# Patient Record
Sex: Female | Born: 1945
Health system: Southern US, Community
[De-identification: ages and names within clinical notes are randomized; demographics above are authoritative.]

## PROBLEM LIST (undated history)

## (undated) DIAGNOSIS — I420 Dilated cardiomyopathy: Secondary | ICD-10-CM

## (undated) DIAGNOSIS — M1990 Unspecified osteoarthritis, unspecified site: Secondary | ICD-10-CM

## (undated) DIAGNOSIS — G459 Transient cerebral ischemic attack, unspecified: Secondary | ICD-10-CM

## (undated) DIAGNOSIS — I700 Atherosclerosis of aorta: Secondary | ICD-10-CM

## (undated) DIAGNOSIS — K219 Gastro-esophageal reflux disease without esophagitis: Secondary | ICD-10-CM

## (undated) DIAGNOSIS — I10 Essential (primary) hypertension: Secondary | ICD-10-CM

## (undated) DIAGNOSIS — K5732 Diverticulitis of large intestine without perforation or abscess without bleeding: Secondary | ICD-10-CM

## (undated) HISTORY — DX: Unspecified osteoarthritis, unspecified site: M19.90

## (undated) HISTORY — DX: Diverticulitis of large intestine without perforation or abscess without bleeding: K57.32

## (undated) HISTORY — DX: Gastro-esophageal reflux disease without esophagitis: K21.9

## (undated) HISTORY — PX: OTHER SURGICAL HISTORY: SHX169

---

## 2000-11-22 LAB — HM COLONOSCOPY: HM Colonoscopy: NORMAL

## 2002-09-22 ENCOUNTER — Encounter: Payer: Self-pay | Admitting: Internal Medicine

## 2003-02-05 ENCOUNTER — Encounter: Payer: Self-pay | Admitting: Internal Medicine

## 2003-02-05 LAB — CONVERTED CEMR LAB

## 2008-03-29 ENCOUNTER — Ambulatory Visit: Payer: Self-pay | Admitting: Internal Medicine

## 2008-03-29 DIAGNOSIS — D179 Benign lipomatous neoplasm, unspecified: Secondary | ICD-10-CM | POA: Insufficient documentation

## 2008-03-29 DIAGNOSIS — L570 Actinic keratosis: Secondary | ICD-10-CM

## 2008-03-29 DIAGNOSIS — R5383 Other fatigue: Secondary | ICD-10-CM

## 2008-03-29 DIAGNOSIS — R5381 Other malaise: Secondary | ICD-10-CM

## 2008-03-29 DIAGNOSIS — I8390 Asymptomatic varicose veins of unspecified lower extremity: Secondary | ICD-10-CM

## 2008-03-31 LAB — CONVERTED CEMR LAB
ALT: 27 units/L (ref 0–35)
AST: 28 units/L (ref 0–37)
BUN: 14 mg/dL (ref 6–23)
Bilirubin, Direct: 0.1 mg/dL (ref 0.0–0.3)
Calcium: 9.8 mg/dL (ref 8.4–10.5)
Creatinine, Ser: 0.7 mg/dL (ref 0.4–1.2)
Eosinophils Relative: 2.3 % (ref 0.0–5.0)
GFR calc Af Amer: 109 mL/min
Glucose, Bld: 90 mg/dL (ref 70–99)
HCT: 37.6 % (ref 36.0–46.0)
Hemoglobin: 12.8 g/dL (ref 12.0–15.0)
Lymphocytes Relative: 46.5 % — ABNORMAL HIGH (ref 12.0–46.0)
Monocytes Absolute: 0.4 10*3/uL (ref 0.1–1.0)
Monocytes Relative: 8.7 % (ref 3.0–12.0)
Neutro Abs: 1.9 10*3/uL (ref 1.4–7.7)
Phosphorus: 4.1 mg/dL (ref 2.3–4.6)
Platelets: 190 10*3/uL (ref 150–400)
Potassium: 3.9 meq/L (ref 3.5–5.1)
Total Bilirubin: 0.9 mg/dL (ref 0.3–1.2)
Total Protein: 8.1 g/dL (ref 6.0–8.3)
WBC: 4.6 10*3/uL (ref 4.5–10.5)

## 2008-05-29 ENCOUNTER — Encounter: Payer: Self-pay | Admitting: Internal Medicine

## 2008-07-23 ENCOUNTER — Ambulatory Visit: Payer: Self-pay | Admitting: Internal Medicine

## 2008-12-20 ENCOUNTER — Ambulatory Visit: Payer: Self-pay | Admitting: Family Medicine

## 2008-12-21 ENCOUNTER — Encounter: Payer: Self-pay | Admitting: Family Medicine

## 2010-08-10 ENCOUNTER — Encounter: Payer: Self-pay | Admitting: Internal Medicine

## 2010-11-14 NOTE — Miscellaneous (Signed)
Summary: FLU VACCINE  Clinical Lists Changes  Observations: Added new observation of FLU VAX: Historical (08/06/2010 12:25)      Influenza Immunization History:    Influenza # 1:  Historical (08/06/2010)

## 2010-11-22 LAB — HM PAP SMEAR: HM Pap smear: NORMAL

## 2011-09-27 ENCOUNTER — Ambulatory Visit: Payer: Self-pay | Admitting: Internal Medicine

## 2011-11-23 ENCOUNTER — Ambulatory Visit (INDEPENDENT_AMBULATORY_CARE_PROVIDER_SITE_OTHER): Payer: Medicare Other | Admitting: Internal Medicine

## 2011-11-23 ENCOUNTER — Ambulatory Visit (INDEPENDENT_AMBULATORY_CARE_PROVIDER_SITE_OTHER)
Admission: RE | Admit: 2011-11-23 | Discharge: 2011-11-23 | Disposition: A | Discharge status: Home / Self Care | Payer: MEDICARE | Source: Ambulatory Visit | Attending: Internal Medicine | Admitting: Internal Medicine

## 2011-11-23 ENCOUNTER — Encounter: Payer: Self-pay | Admitting: Internal Medicine

## 2011-11-23 VITALS — BP 146/88 | HR 96 | Temp 97.4°F | Ht 65.0 in | Wt 159.0 lb

## 2011-11-23 DIAGNOSIS — I8390 Asymptomatic varicose veins of unspecified lower extremity: Secondary | ICD-10-CM

## 2011-11-23 DIAGNOSIS — M542 Cervicalgia: Secondary | ICD-10-CM

## 2011-11-23 DIAGNOSIS — Z1211 Encounter for screening for malignant neoplasm of colon: Secondary | ICD-10-CM

## 2011-11-23 DIAGNOSIS — R5383 Other fatigue: Secondary | ICD-10-CM

## 2011-11-23 DIAGNOSIS — D709 Neutropenia, unspecified: Secondary | ICD-10-CM

## 2011-11-23 DIAGNOSIS — Z1239 Encounter for other screening for malignant neoplasm of breast: Secondary | ICD-10-CM

## 2011-11-23 DIAGNOSIS — Z13220 Encounter for screening for lipoid disorders: Secondary | ICD-10-CM

## 2011-11-23 DIAGNOSIS — G542 Cervical root disorders, not elsewhere classified: Secondary | ICD-10-CM

## 2011-11-23 NOTE — Patient Instructions (Signed)
I recommend a cervical pillow to provide neck support at night.  Your shoulder tingling appears to be cause by your bra strap which is causing a compressive neuropathy  We will get a mammogram at [facility]  We will refer you to a Gi specialist for your colonoscopy. (I will look for Nancy's same doc)  I will order an x ray of your neck , to be done at [facility] office this afternoon.   A baby aspirin two or three times weekly is protective on your heart  Referral to Vascular Surgeon under way for varicose veins.   Return for fasting labs at your convenience.
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
## 2011-11-25 ENCOUNTER — Encounter: Payer: Self-pay | Admitting: Internal Medicine

## 2011-11-25 DIAGNOSIS — M1990 Unspecified osteoarthritis, unspecified site: Secondary | ICD-10-CM | POA: Insufficient documentation

## 2011-11-25 DIAGNOSIS — G542 Cervical root disorders, not elsewhere classified: Secondary | ICD-10-CM | POA: Insufficient documentation

## 2011-11-25 DIAGNOSIS — Z1239 Encounter for other screening for malignant neoplasm of breast: Secondary | ICD-10-CM | POA: Insufficient documentation

## 2011-11-25 DIAGNOSIS — K219 Gastro-esophageal reflux disease without esophagitis: Secondary | ICD-10-CM | POA: Insufficient documentation

## 2011-11-25 DIAGNOSIS — D709 Neutropenia, unspecified: Secondary | ICD-10-CM | POA: Insufficient documentation

## 2011-11-25 DIAGNOSIS — M542 Cervicalgia: Secondary | ICD-10-CM | POA: Insufficient documentation

## 2011-11-25 DIAGNOSIS — K5732 Diverticulitis of large intestine without perforation or abscess without bleeding: Secondary | ICD-10-CM | POA: Insufficient documentation

## 2011-11-25 DIAGNOSIS — Z1231 Encounter for screening mammogram for malignant neoplasm of breast: Secondary | ICD-10-CM | POA: Insufficient documentation

## 2011-11-25 NOTE — Assessment & Plan Note (Signed)
She has no history of frequent infections. I have recommended an delivery to her prior CBCs and look at the cell lines. There is no immediate need for referral to hematology.

## 2011-11-25 NOTE — Assessment & Plan Note (Signed)
Her posterior lateral neck pain is likely due to arthritis involving the C3-C4 vertebrae in the neck. Will obtain plain films to evaluate alignment and rule out large bone spurs.

## 2011-11-25 NOTE — Assessment & Plan Note (Signed)
She has been referred for mammography.

## 2011-11-25 NOTE — Assessment & Plan Note (Signed)
In her left shoulder neuropathy by history is suggestive of compression secondary to her bra straps. She is a 36D and despite wearing wide straps continues to have signs of compression on exam. I have suggested that she try wearing sports bras for several months to see if her symptoms resolve.  We did discuss breast reduction she may qualify for this given her symptoms. Her this would require a diagnosis of neuropathy and the only way to really diagnose this would be to send her to a neurologist but she does not want to do at this point.

## 2011-11-25 NOTE — Assessment & Plan Note (Signed)
She has several large bulging veins from upper thigh to ankle that have become problematic. She is requesting vascular evaluation. Will refer to Dr. dew and Dr. Jed Limerick. I have recommended that she wear compression stockings whenever possible thigh-high to prevent irritation and aggravation.

## 2011-11-27 ENCOUNTER — Other Ambulatory Visit (INDEPENDENT_AMBULATORY_CARE_PROVIDER_SITE_OTHER): Payer: Medicare Other | Admitting: *Deleted

## 2011-11-27 DIAGNOSIS — R5381 Other malaise: Secondary | ICD-10-CM

## 2011-11-27 DIAGNOSIS — D709 Neutropenia, unspecified: Secondary | ICD-10-CM

## 2011-11-27 DIAGNOSIS — Z13220 Encounter for screening for lipoid disorders: Secondary | ICD-10-CM

## 2011-11-27 DIAGNOSIS — R5383 Other fatigue: Secondary | ICD-10-CM

## 2011-11-27 LAB — COMPREHENSIVE METABOLIC PANEL
ALT: 23 U/L (ref 0–35)
BUN: 16 mg/dL (ref 6–23)
CO2: 26 mEq/L (ref 19–32)
Calcium: 9.2 mg/dL (ref 8.4–10.5)
Chloride: 102 mEq/L (ref 96–112)
Creatinine, Ser: 0.8 mg/dL (ref 0.4–1.2)
GFR: 82.41 mL/min (ref >60.00)
Glucose, Bld: 93 mg/dL (ref 70–99)
Total Bilirubin: 0.7 mg/dL (ref 0.3–1.2)

## 2011-11-27 LAB — CBC WITH DIFFERENTIAL/PLATELET
Basophils Absolute: 0 10*3/uL (ref 0.0–0.1)
Basophils Relative: 0.5 % (ref 0.0–3.0)
Eosinophils Relative: 3.4 % (ref 0.0–5.0)
HCT: 36.5 % (ref 36.0–46.0)
Hemoglobin: 12.4 g/dL (ref 12.0–15.0)
Lymphocytes Relative: 49 % — ABNORMAL HIGH (ref 12.0–46.0)
Lymphs Abs: 2 10*3/uL (ref 0.7–4.0)
Monocytes Relative: 12 % (ref 3.0–12.0)
Neutro Abs: 1.4 10*3/uL (ref 1.4–7.7)
RBC: 3.96 Mil/uL (ref 3.87–5.11)
RDW: 12.5 % (ref 11.5–14.6)
WBC: 4.1 10*3/uL — ABNORMAL LOW (ref 4.5–10.5)

## 2011-11-27 LAB — LIPID PANEL
Cholesterol: 203 mg/dL — ABNORMAL HIGH (ref 0–200)
Total CHOL/HDL Ratio: 4
VLDL: 16.2 mg/dL (ref 0.0–40.0)

## 2011-11-27 LAB — LDL CHOLESTEROL, DIRECT: Direct LDL: 141.2 mg/dL

## 2011-11-27 NOTE — Progress Notes (Signed)
Addended by: Jobie Quaker on: 11/27/2011 11:16 AM   Modules accepted: Orders

## 2012-01-08 ENCOUNTER — Ambulatory Visit: Payer: Self-pay | Admitting: Internal Medicine

## 2012-01-18 ENCOUNTER — Encounter: Payer: Self-pay | Admitting: Internal Medicine

## 2012-02-12 ENCOUNTER — Ambulatory Visit: Payer: Self-pay | Admitting: Unknown Physician Specialty

## 2012-02-25 ENCOUNTER — Encounter: Payer: Self-pay | Admitting: Internal Medicine

## 2012-02-25 DIAGNOSIS — D126 Benign neoplasm of colon, unspecified: Secondary | ICD-10-CM | POA: Insufficient documentation

## 2012-10-21 ENCOUNTER — Telehealth: Payer: Self-pay | Admitting: Internal Medicine

## 2012-10-21 NOTE — Telephone Encounter (Signed)
Patient Information:  Caller Name: [place]  Phone: [phone]  Patient: Sally Morgan, Sally Morgan  Gender: Female  DOB: 1946-06-02  Age: 67 Years  PCP: Duncan Dull (Adults only)  Office Follow Up:  Does the office need to follow up with this patient?: No  Instructions For The Office: N/A  RN Note:  Patient calls regarding chickenpox exposure from grandson.  RN used CDC and information on Chickenpox to educate caller and answer her questions.  Caller demonstrated her understanding of information given.  Symptoms  Reason For Call & Symptoms: Called with question about Chickenpox exposure.  Reviewed Health History In EMR: N/A  Reviewed Medications In EMR: N/A  Reviewed Allergies In EMR: N/A  Reviewed Surgeries / Procedures: N/A  Date of Onset of Symptoms: Unknown  Guideline(s) Used:  No Protocol Available - Information Only  Disposition Per Guideline:   Home Care  Reason For Disposition Reached:   Information only question and nurse able to answer  Advice Given:  Call Back If:  New symptoms develop  You become worse.

## 2013-10-26 LAB — HM PAP SMEAR: HM Pap smear: NORMAL

## 2013-11-02 ENCOUNTER — Other Ambulatory Visit (HOSPITAL_COMMUNITY)
Admission: RE | Admit: 2013-11-02 | Discharge: 2013-11-02 | Disposition: A | Discharge status: Home / Self Care | Payer: Medicare Other | Source: Ambulatory Visit | Attending: Internal Medicine | Admitting: Internal Medicine

## 2013-11-02 ENCOUNTER — Ambulatory Visit (INDEPENDENT_AMBULATORY_CARE_PROVIDER_SITE_OTHER): Payer: Medicare Other | Admitting: Internal Medicine

## 2013-11-02 ENCOUNTER — Telehealth: Payer: Self-pay | Admitting: *Deleted

## 2013-11-02 ENCOUNTER — Ambulatory Visit: Payer: Self-pay | Admitting: Internal Medicine

## 2013-11-02 ENCOUNTER — Encounter: Payer: Self-pay | Admitting: Internal Medicine

## 2013-11-02 ENCOUNTER — Telehealth: Payer: Self-pay | Admitting: Internal Medicine

## 2013-11-02 VITALS — BP 158/78 | HR 83 | Temp 97.3°F | Resp 16 | Ht 66.25 in | Wt 144.5 lb

## 2013-11-02 DIAGNOSIS — E559 Vitamin D deficiency, unspecified: Secondary | ICD-10-CM

## 2013-11-02 DIAGNOSIS — R5383 Other fatigue: Secondary | ICD-10-CM

## 2013-11-02 DIAGNOSIS — Z1151 Encounter for screening for human papillomavirus (HPV): Secondary | ICD-10-CM | POA: Insufficient documentation

## 2013-11-02 DIAGNOSIS — Z124 Encounter for screening for malignant neoplasm of cervix: Secondary | ICD-10-CM

## 2013-11-02 DIAGNOSIS — I8010 Phlebitis and thrombophlebitis of unspecified femoral vein: Secondary | ICD-10-CM

## 2013-11-02 DIAGNOSIS — R030 Elevated blood-pressure reading, without diagnosis of hypertension: Secondary | ICD-10-CM

## 2013-11-02 DIAGNOSIS — M8580 Other specified disorders of bone density and structure, unspecified site: Secondary | ICD-10-CM

## 2013-11-02 DIAGNOSIS — Z86718 Personal history of other venous thrombosis and embolism: Secondary | ICD-10-CM | POA: Insufficient documentation

## 2013-11-02 DIAGNOSIS — Z23 Encounter for immunization: Secondary | ICD-10-CM

## 2013-11-02 DIAGNOSIS — M899 Disorder of bone, unspecified: Secondary | ICD-10-CM

## 2013-11-02 DIAGNOSIS — Z0000 Encounter for general adult medical examination without abnormal findings: Secondary | ICD-10-CM

## 2013-11-02 DIAGNOSIS — M949 Disorder of cartilage, unspecified: Secondary | ICD-10-CM

## 2013-11-02 DIAGNOSIS — Z01419 Encounter for gynecological examination (general) (routine) without abnormal findings: Secondary | ICD-10-CM

## 2013-11-02 DIAGNOSIS — I10 Essential (primary) hypertension: Secondary | ICD-10-CM | POA: Insufficient documentation

## 2013-11-02 DIAGNOSIS — R079 Chest pain, unspecified: Secondary | ICD-10-CM | POA: Insufficient documentation

## 2013-11-02 DIAGNOSIS — I82419 Acute embolism and thrombosis of unspecified femoral vein: Secondary | ICD-10-CM

## 2013-11-02 DIAGNOSIS — R5381 Other malaise: Secondary | ICD-10-CM

## 2013-11-02 DIAGNOSIS — E785 Hyperlipidemia, unspecified: Secondary | ICD-10-CM

## 2013-11-02 DIAGNOSIS — Z1239 Encounter for other screening for malignant neoplasm of breast: Secondary | ICD-10-CM

## 2013-11-02 MED ORDER — TETANUS-DIPHTH-ACELL PERTUSSIS 5-2.5-18.5 LF-MCG/0.5 IM SUSP: 0.5000 mL | Freq: Once | INTRAMUSCULAR | Status: DC

## 2013-11-02 MED ORDER — ZOSTER VACCINE LIVE 19400 UNT/0.65ML ~~LOC~~ SOLR: 0.6500 mL | Freq: Once | SUBCUTANEOUS | Status: DC

## 2013-11-02 NOTE — Progress Notes (Deleted)
Patient ID: Henrine Hayter, female   DOB: January 07, 1946, 68 y.o.   MRN: [id number]   Subjective:     Suan Pyeatt is a 68 y.o. female and is here for a comprehensive physical exam. The patient reports {problems:16946}.  History   Social History  . Marital Status: Widowed    Spouse Name: N/A    Number of Children: N/A  . Years of Education: N/A   Occupational History  . Not on file.   Social History Main Topics  . Smoking status: Never Smoker   . Smokeless tobacco: Not on file  . Alcohol Use: 1.5 oz/week    3 drink(s) per week  . Drug Use: No  . Sexual Activity: No   Other Topics Concern  . Not on file   Social History Narrative  . No narrative on file   Health Maintenance  Topic Date Due  . Zostavax  10/28/2006  . Tetanus/tdap  02/03/2013  . Influenza Vaccine  03/14/2014  . Mammogram  01/07/2014  . Colonoscopy  02/24/2022    {Common ambulatory SmartLinks:19316}  Review of Systems {ros; complete:30496}   Objective:    {Exam, [phone]}    Assessment:    Healthy female exam. ***     Plan:     See After Visit Summary for Counseling Recommendations

## 2013-11-02 NOTE — Telephone Encounter (Signed)
See prior note

## 2013-11-02 NOTE — Telephone Encounter (Signed)
CALL REPORT FOR CT ANGIOGRAPH OF CHEST FOR PE:  No acute cardio pulmonary disease, no evidence of pulmonary embolism.   Mild fusiform ectasia of the ascending thoracic aorta measuring approximately 37 mm in diameter.   No definite evidence of thoracic aortic dissection or peri-aortic stranding cardiomegaly in particular. There is apparent enlargement of the right atrium and ventricular.  Further evaluation with cardio echo could be performed as clinically indicated stigmata of prior granulomatous as above.

## 2013-11-02 NOTE — Telephone Encounter (Signed)
Ct scan of chest was negative for PE , but several chambers of the heart were enlarged  So given her histoyr of chest pain an dno prior cardiac workup,  I am referring her to Dr Fletcher Anon for cardiology evaluation. Amber will call her with appt.

## 2013-11-02 NOTE — Patient Instructions (Addendum)
You had your annual Medicare wellness exam today, but we also discussed your hypertension and your recent episode of chest pain in the setting of recent DVT  I am ordering a CT of your chest to rule out PE   We will schedule your mammogram soon (3d) , and your Bone Density test   You need to have a TDaP vaccine and a Shingles vaccine.  I have given you prescriptions for thses because they will be cheaper at the health Dept or at your  local pharmacy because Medicare will not reimburse for them.  Wait until we confirm that you had chicken pox with your labs   Please return ASAP for fasting bloodwork   You received the pneumonia vaccine today.  We will contact you with the bloodwork results .

## 2013-11-03 DIAGNOSIS — Z0000 Encounter for general adult medical examination without abnormal findings: Secondary | ICD-10-CM | POA: Insufficient documentation

## 2013-11-03 MED ORDER — ASPIRIN EC 325 MG PO TBEC: 325.0000 mg | DELAYED_RELEASE_TABLET | Freq: Every day | ORAL | Status: DC

## 2013-11-03 NOTE — Assessment & Plan Note (Signed)
Annual comprehensive exam was done including breast, pelvic and PAP smear. All screenings have been addressed .  

## 2013-11-03 NOTE — Telephone Encounter (Signed)
Patient notified and voiced understanding.

## 2013-11-03 NOTE — Assessment & Plan Note (Signed)
She has a history 2 hypertension by history and had a prior evaluation in [place] with a 24 hour ambulatory blood pressure monitor.  She has noted however that that lately her blood pressures are normal at home only at rest. I'm recommending that we start her on a medication given her undiagnosed cardiac issues. However she has not had blood work in a year. She will return for fasting blood work and decision was made based on the assessment of renal function and presence or absence of proteinuria. Given her history lost to followup it would be prudent to consider using some medication that does not require followup however given her undisclosed cardiac issues the best medication would probably prove to be an ACE inhibitor.

## 2013-11-03 NOTE — Progress Notes (Signed)
Patient ID: Sally Morgan, female   DOB: 1946-06-18, 68 y.o.   MRN: [id number]  The patient is here for annual Medicare wellness examination and management of other chronic and acute problems. She was last seen 2 years ago in 2013 for establishment of care. At that time she was long due for many screenings and these were all set up for her. Her She had a recent episode of severe chest pain occurring on the left side radiating to her scapular area accompanied by elevated blood pressure which would not register on her home machine.   the episode occurred about a week after stopping Xarelto which was prescribed by her vascular surgeon for a period of 2 weeks for management of a postprocedural DVT in her left lower extremity following an ablation procedure for varicose vein.  Despite these alarming symptoms she did not seek ER evaluation or call her vascular surgeon or primary care doctor. She convinced herself that the chest pain was her usual scapular chest pain which she gets during exercise. She's had no prior cardiac evaluation.  The risk factors are reflected in the social history.  The roster of all physicians providing medical care to patient - is listed in the Snapshot section of the chart.  Activities of daily living:  The patient is 100% independent in all ADLs: dressing, toileting, feeding as well as independent mobility  Home safety : The patient has smoke detectors in the home. They wear seatbelts.  There are no firearms at home. There is no violence in the home.   There is no risks for hepatitis, STDs or HIV. There is no   history of blood transfusion. They have no travel history to infectious disease endemic areas of the world.  The patient has seen their dentist in the last six month. They have seen their eye doctor in the last year. They admit to slight hearing difficulty with regard to whispered voices and some television programs.  They have deferred audiologic testing in the last year.   They do not  have excessive sun exposure. Discussed the need for sun protection: hats, long sleeves and use of sunscreen if there is significant sun exposure.   Diet: the importance of a healthy diet is discussed. They do have a healthy diet.  The benefits of regular aerobic exercise were discussed. She walks 4 times per week ,  20 minutes.   Depression screen: there are no signs or vegative symptoms of depression- irritability, change in appetite, anhedonia, sadness/tearfullness.  Cognitive assessment: the patient manages all their financial and personal affairs and is actively engaged. They could relate day,date,year and events; recalled 2/3 objects at 3 minutes; performed clock-face test normally.  The following portions of the patient's history were reviewed and updated as appropriate: allergies, current medications, past family history, past medical history,  past surgical history, past social history  and problem list.  Visual acuity was not assessed per patient preference since she has regular follow up with her ophthalmologist. Hearing and body mass index were assessed and reviewed.   During the course of the visit the patient was educated and counseled about appropriate screening and preventive services including : fall prevention , diabetes screening, nutrition counseling, colorectal cancer screening, and recommended immunizations.    Objective:  BP 158/78  Pulse 83  Temp(Src) 97.3 F (36.3 C) (Oral)  Resp 16  Ht 5' 6.25" (1.683 m)  Wt 144 lb 8 oz (65.545 kg)  BMI 23.14 kg/m2  SpO2 99%  General  Appearance:    Alert, cooperative, no distress, appears stated age  Head:    Normocephalic, without obvious abnormality, atraumatic  Eyes:    PERRL, conjunctiva/corneas clear, EOM's intact, fundi    benign, both eyes  Ears:    Normal TM's and external ear canals, both ears  Nose:   Nares normal, septum midline, mucosa normal, no drainage    or sinus tenderness  Throat:   Lips,  mucosa, and tongue normal; teeth and gums normal  Neck:   Supple, symmetrical, trachea midline, no adenopathy;    thyroid:  no enlargement/tenderness/nodules; no carotid   bruit or JVD  Back:     Symmetric, no curvature, ROM normal, no CVA tenderness  Lungs:     Clear to auscultation bilaterally, respirations unlabored  Chest Wall:    No tenderness or deformity   Heart:    Regular rate and rhythm, S1 and S2 normal, no murmur, rub   or gallop  Breast Exam:    No tenderness, masses, or nipple abnormality  Abdomen:     Soft, non-tender, bowel sounds active all four quadrants,    no masses, no organomegaly  Genitalia:    Normal female without lesion, discharge or tenderness  Extremities:   Extremities normal, atraumatic, no cyanosis or edema  Pulses:   2+ and symmetric all extremities  Skin:   Skin color, texture, turgor normal, no rashes or lesions  Lymph nodes:   Cervical, supraclavicular, and axillary nodes normal  Neurologic:   CNII-XII intact, normal strength, sensation and reflexes    throughout    Assessment and Plan:  Chest pain, unspecified Because of her recent DVT it was imperative that he was clot in her lungs be ruled out. Chest CT was negative for blood clot. However aortic ectasia was noted as well as biatrial enlargement of the heart. I'm referring her to cardiology for further evaluation.  DVT femoral (deep venous thrombosis) with thrombophlebitis Occurring post procedurally after treatment of left leg varicose vein. She underwent treatment with Zaroxolyn x2 weeks. She does not want to continue therapy with Zaroxolyn or any other anticoagulant. He has not a baby aspirin semi-regularly . I strongly advised her to continue 325 mg of aspirin daily. Repeat ultrasounds showed no propagation per [place] [facility] reports.  Encounter for preventive health examination Annual comprehensive exam was done including breast, pelvic and PAP smear. All screenings have been  addressed .   Elevated blood pressure reading without diagnosis of hypertension She has a history 2 hypertension by history and had a prior evaluation in [place] with a 24 hour ambulatory blood pressure monitor.  She has noted however that that lately her blood pressures are normal at home only at rest. I'm recommending that we start her on a medication given her undiagnosed cardiac issues. However she has not had blood work in a year. She will return for fasting blood work and decision was made based on the assessment of renal function and presence or absence of proteinuria. Given her history lost to followup it would be prudent to consider using some medication that does not require followup however given her undisclosed cardiac issues the best medication would probably prove to be an ACE inhibitor.  A total of 60 minutes was spent with patient more than half of which was spent in counseling, reviewing records from other prviders and coordination of care.  Updated Medication List Outpatient Encounter Prescriptions as of 11/02/2013  Medication Sig  . [DISCONTINUED] aspirin 81 MG tablet  Take 81 mg by mouth daily.  Marland Kitchen aspirin EC 325 MG tablet Take 1 tablet (325 mg total) by mouth daily.  . Tdap (BOOSTRIX) 5-2.5-18.5 LF-MCG/0.5 injection Inject 0.5 mLs into the muscle once.  . zoster vaccine live, PF, (ZOSTAVAX) 82641 UNT/0.65ML injection Inject 19,400 Units into the skin once.

## 2013-11-03 NOTE — Assessment & Plan Note (Addendum)
Occurring post procedurally after treatment of left leg varicose vein. She underwent treatment with Zaroxolyn x2 weeks. She does not want to continue therapy with Zaroxolyn or any other anticoagulant. He has not a baby aspirin semi-regularly . I strongly advised her to continue 325 mg of aspirin daily. Repeat ultrasounds showed no propagation per [place] [facility] vascular reports.

## 2013-11-03 NOTE — Assessment & Plan Note (Signed)
Because of her recent DVT it was imperative that he was clot in her lungs be ruled out. Chest CT was negative for blood clot. However aortic ectasia was noted as well as biatrial enlargement of the heart. I'm referring her to cardiology for further evaluation.

## 2013-11-03 NOTE — Telephone Encounter (Signed)
Reported previous note to patient concerning cardiology work up.

## 2013-11-04 ENCOUNTER — Encounter: Payer: Self-pay | Admitting: *Deleted

## 2013-11-05 ENCOUNTER — Other Ambulatory Visit (INDEPENDENT_AMBULATORY_CARE_PROVIDER_SITE_OTHER): Payer: Medicare Other

## 2013-11-05 DIAGNOSIS — Z79899 Other long term (current) drug therapy: Secondary | ICD-10-CM

## 2013-11-05 DIAGNOSIS — E559 Vitamin D deficiency, unspecified: Secondary | ICD-10-CM

## 2013-11-05 DIAGNOSIS — R5381 Other malaise: Secondary | ICD-10-CM

## 2013-11-05 DIAGNOSIS — R030 Elevated blood-pressure reading, without diagnosis of hypertension: Secondary | ICD-10-CM

## 2013-11-05 DIAGNOSIS — Z23 Encounter for immunization: Secondary | ICD-10-CM

## 2013-11-05 DIAGNOSIS — R5383 Other fatigue: Principal | ICD-10-CM

## 2013-11-05 DIAGNOSIS — E785 Hyperlipidemia, unspecified: Secondary | ICD-10-CM

## 2013-11-05 LAB — CBC WITH DIFFERENTIAL/PLATELET
BASOS ABS: 0 10*3/uL (ref 0.0–0.1)
BASOS PCT: 0.4 % (ref 0.0–3.0)
Eosinophils Absolute: 0 10*3/uL (ref 0.0–0.7)
Eosinophils Relative: 0.8 % (ref 0.0–5.0)
HEMATOCRIT: 37 % (ref 36.0–46.0)
HEMOGLOBIN: 12.6 g/dL (ref 12.0–15.0)
LYMPHS ABS: 1.6 10*3/uL (ref 0.7–4.0)
Lymphocytes Relative: 38.6 % (ref 12.0–46.0)
MCHC: 34 g/dL (ref 30.0–36.0)
MCV: 91.7 fl (ref 78.0–100.0)
MONOS PCT: 8.7 % (ref 3.0–12.0)
Monocytes Absolute: 0.4 10*3/uL (ref 0.1–1.0)
NEUTROS ABS: 2.1 10*3/uL (ref 1.4–7.7)
Neutrophils Relative %: 51.5 % (ref 43.0–77.0)
Platelets: 186 10*3/uL (ref 150.0–400.0)
RBC: 4.03 Mil/uL (ref 3.87–5.11)
RDW: 12.9 % (ref 11.5–14.6)
WBC: 4.2 10*3/uL — ABNORMAL LOW (ref 4.5–10.5)

## 2013-11-05 LAB — COMPREHENSIVE METABOLIC PANEL
ALT: 18 U/L (ref 0–35)
AST: 21 U/L (ref 0–37)
Albumin: 4.3 g/dL (ref 3.5–5.2)
Alkaline Phosphatase: 62 U/L (ref 39–117)
BILIRUBIN TOTAL: 0.9 mg/dL (ref 0.3–1.2)
BUN: 14 mg/dL (ref 6–23)
CO2: 26 meq/L (ref 19–32)
CREATININE: 0.7 mg/dL (ref 0.4–1.2)
Calcium: 9.5 mg/dL (ref 8.4–10.5)
Chloride: 104 mEq/L (ref 96–112)
GFR: 94.94 mL/min (ref >60.00)
Glucose, Bld: 91 mg/dL (ref 70–99)
Potassium: 4 mEq/L (ref 3.5–5.1)
Sodium: 138 mEq/L (ref 135–145)
Total Protein: 7.6 g/dL (ref 6.0–8.3)

## 2013-11-05 LAB — LDL CHOLESTEROL, DIRECT: LDL DIRECT: 139 mg/dL

## 2013-11-05 LAB — MICROALBUMIN / CREATININE URINE RATIO
CREATININE, U: 36.2 mg/dL
MICROALB UR: 0.4 mg/dL (ref 0.0–1.9)
MICROALB/CREAT RATIO: 1.1 mg/g (ref 0.0–30.0)

## 2013-11-05 LAB — LIPID PANEL
CHOLESTEROL: 210 mg/dL — AB (ref 0–200)
HDL: 65.7 mg/dL (ref >39.00)
TRIGLYCERIDES: 51 mg/dL (ref 0.0–149.0)
Total CHOL/HDL Ratio: 3
VLDL: 10.2 mg/dL (ref 0.0–40.0)

## 2013-11-05 LAB — TSH: TSH: 0.78 u[IU]/mL (ref 0.35–5.50)

## 2013-11-06 LAB — VARICELLA ZOSTER ABS, IGG/IGM
Varicella IgM: <0.91 index (ref 0.00–0.90)
Varicella zoster IgG: 1569 index (ref >165)

## 2013-11-06 LAB — VITAMIN D 25 HYDROXY (VIT D DEFICIENCY, FRACTURES): Vit D, 25-Hydroxy: 30 ng/mL (ref 30–89)

## 2013-11-08 ENCOUNTER — Encounter: Payer: Self-pay | Admitting: Internal Medicine

## 2013-11-08 DIAGNOSIS — E785 Hyperlipidemia, unspecified: Secondary | ICD-10-CM | POA: Insufficient documentation

## 2013-11-08 MED ORDER — LISINOPRIL 10 MG PO TABS: 10.0000 mg | ORAL_TABLET | Freq: Every day | ORAL | Status: DC

## 2013-11-08 MED ORDER — SIMVASTATIN 20 MG PO TABS: 20.0000 mg | ORAL_TABLET | Freq: Every day | ORAL | Status: DC

## 2013-11-08 NOTE — Assessment & Plan Note (Signed)
Advising statin therapy for 10 yr risk of CAD 14%. Simvastatin  20 mg .. Needs repeat fasting labs mid march

## 2013-11-08 NOTE — Assessment & Plan Note (Signed)
Starting low dose lisinopril; needs BMET in one week .  Will have Dr Donivan Scull office draw it.

## 2013-11-08 NOTE — Addendum Note (Signed)
Addended by: Crecencio Mc on: 11/08/2013 08:47 AM   Modules accepted: Orders

## 2013-11-09 LAB — HM MAMMOGRAPHY: HM Mammogram: NORMAL

## 2013-11-17 ENCOUNTER — Ambulatory Visit (INDEPENDENT_AMBULATORY_CARE_PROVIDER_SITE_OTHER): Payer: Medicare Other | Admitting: Cardiovascular Disease

## 2013-11-17 ENCOUNTER — Encounter: Payer: Self-pay | Admitting: Cardiovascular Disease

## 2013-11-17 VITALS — BP 120/80 | HR 88 | Ht 65.5 in | Wt 147.0 lb

## 2013-11-17 DIAGNOSIS — I8390 Asymptomatic varicose veins of unspecified lower extremity: Secondary | ICD-10-CM

## 2013-11-17 DIAGNOSIS — E785 Hyperlipidemia, unspecified: Secondary | ICD-10-CM

## 2013-11-17 DIAGNOSIS — I10 Essential (primary) hypertension: Secondary | ICD-10-CM

## 2013-11-17 DIAGNOSIS — I7789 Other specified disorders of arteries and arterioles: Secondary | ICD-10-CM

## 2013-11-17 DIAGNOSIS — I498 Other specified cardiac arrhythmias: Secondary | ICD-10-CM

## 2013-11-17 DIAGNOSIS — R000 Tachycardia, unspecified: Secondary | ICD-10-CM

## 2013-11-17 DIAGNOSIS — I8010 Phlebitis and thrombophlebitis of unspecified femoral vein: Secondary | ICD-10-CM

## 2013-11-17 DIAGNOSIS — I82419 Acute embolism and thrombosis of unspecified femoral vein: Secondary | ICD-10-CM

## 2013-11-17 DIAGNOSIS — I700 Atherosclerosis of aorta: Secondary | ICD-10-CM

## 2013-11-17 MED ORDER — PROPRANOLOL HCL 20 MG PO TABS: 20.0000 mg | ORAL_TABLET | Freq: Three times a day (TID) | ORAL | Status: DC | PRN

## 2013-11-17 NOTE — Assessment & Plan Note (Signed)
She has not started simvastatin yet. She tries to eat healthy, watch her weight. She prefers that we look at her CT scan to determine the level of atherosclerotic plaque for starting the medication. We'll try to do this for her

## 2013-11-17 NOTE — Assessment & Plan Note (Signed)
I suspect her symptoms of tachycardia could be from anxiety. Normally heart rate is well controlled. We have suggested that she take propranolol when necessary for episodes of tachycardia. If she continues to have frequent episodes, Holter or event monitor could be worn

## 2013-11-17 NOTE — Patient Instructions (Addendum)
You are doing well. Please take propranolol as needed for fast heart rate  We will schedule an echocardiogram to look at the atria, ventricles  I will look at your CT scan to determine how important it is to stay on a cholesterol pill  Monitor your blood pressure AT HOME Wean down the dose if not needed for high pressure  Please call us if you have new issues that need to be addressed before your next appt.

## 2013-11-17 NOTE — Progress Notes (Signed)
Patient ID: Sally Morgan, female    DOB: [birth year], 68 y.o.   MRN: [id number]  HPI Comments: [name] is a pleasant 68 year old woman who has a history of hyperlipidemia, previously lost her husband 67 years ago to a cardiac issues, who had vein ablation 3 months ago on the left with post procedure DVT noted on ultrasound. She was initially started on aspirin though this was changed to xarelto twice a day after the thrombus extended. She was given this for 2 weeks and then anticoagulation was held.   She does report having episode of significant tachycardia 3-4 weeks ago starting in the evening. She has had episodes of tachycardia in the past when she was nervous. On this particular evening, heart rate was greater than 100, she is unable to measure her blood pressure as it was very elevated. She tried to rest and symptoms seemed to resolve by the morning. She was "paralyzed with fear" she did not know what to do.  Since then she has not had any further episodes and has felt better. She denies having any leg swelling. She had a CT scan of the chest for symptoms and this showed no significant PE ED, nondilated atrium, possibly ventricle, mildly dilated and ectatic ascending aorta.  She is relatively active at baseline with no complaints. Denies having any chest pain with exertion. She was recently given simvastatin and has not started this yet as she does not like cholesterol medication or pills in general. Blood pressure was elevated recently which she believes was secondary to the stress of her DVT. She has taken lisinopril for 2 days. She is uncertain if she needs a blood pressure pill as blood pressure typically 413 systolic at rest, 244W with exertion.  EKG shows normal sinus rhythm with rate 89 beats per minute, T-wave abnormality in the anterior precordial leads     Outpatient Encounter Prescriptions as of 11/17/2013  Medication Sig  . aspirin EC 325 MG tablet Take 1 tablet (325 mg total) by  mouth daily.  Marland Kitchen lisinopril (PRINIVIL,ZESTRIL) 10 MG tablet Take 1 tablet (10 mg total) by mouth daily.  . simvastatin (ZOCOR) 20 MG tablet Take 1 tablet (20 mg total) by mouth daily. Has not started this yet   . Tdap (BOOSTRIX) 5-2.5-18.5 LF-MCG/0.5 injection Inject 0.5 mLs into the muscle once.  . zoster vaccine live, PF, (ZOSTAVAX) 10272 UNT/0.65ML injection Inject 19,400 Units into the skin once.    Review of Systems  Constitutional: Negative.   HENT: Negative.   Eyes: Negative.   Respiratory: Negative.   Cardiovascular: Negative.   Gastrointestinal: Negative.   Endocrine: Negative.   Musculoskeletal: Negative.   Skin: Negative.   Allergic/Immunologic: Negative.   Neurological: Negative.   Hematological: Negative.   Psychiatric/Behavioral: The patient is nervous/anxious.   All other systems reviewed and are negative.    BP 120/80  Pulse 88  Ht 5' 5.5" (1.664 m)  Wt 147 lb (66.679 kg)  BMI 24.08 kg/m2  Physical Exam  Nursing note and vitals reviewed. Constitutional: She is oriented to person, place, and time. She appears well-developed and well-nourished.  HENT:  Head: Normocephalic.  Nose: Nose normal.  Mouth/Throat: Oropharynx is clear and moist.  Eyes: Conjunctivae are normal. Pupils are equal, round, and reactive to light.  Neck: Normal range of motion. Neck supple. No JVD present.  Cardiovascular: Normal rate, regular rhythm, S1 normal, S2 normal, normal heart sounds and intact distal pulses.  Exam reveals no gallop and no friction rub.  No murmur heard. Pulmonary/Chest: Effort normal and breath sounds normal. No respiratory distress. She has no wheezes. She has no rales. She exhibits no tenderness.  Abdominal: Soft. Bowel sounds are normal. She exhibits no distension. There is no tenderness.  Musculoskeletal: Normal range of motion. She exhibits no edema and no tenderness.  Lymphadenopathy:    She has no cervical adenopathy.  Neurological: She is alert and  oriented to person, place, and time. Coordination normal.  Skin: Skin is warm and dry. No rash noted. No erythema.  Psychiatric: She has a normal mood and affect. Her behavior is normal. Judgment and thought content normal.    Assessment and Plan

## 2013-11-17 NOTE — Assessment & Plan Note (Signed)
Recent vein ablation, subsequent DVT after the procedure. Given anticoagulation for 2 weeks

## 2013-11-17 NOTE — Assessment & Plan Note (Signed)
She is hesitant to take a statin despite elevated cholesterol numbers. She's not started simvastatin yet. We'll examine her CT scan for her at her request.  if there is significant atherosclerotic burden, I would try to stress to her the importance of good cholesterol control .

## 2013-11-17 NOTE — Assessment & Plan Note (Signed)
She does not have any further symptoms from her leg. Is able to weight-bear, no significant swelling. This was relatively stressful for her likely explaining her episode of tachycardia

## 2013-11-24 ENCOUNTER — Ambulatory Visit: Payer: Self-pay | Admitting: Internal Medicine

## 2013-11-24 LAB — HM MAMMOGRAPHY: HM Mammogram: NORMAL

## 2013-11-25 ENCOUNTER — Ambulatory Visit: Payer: Self-pay | Admitting: Internal Medicine

## 2013-11-25 LAB — HM DEXA SCAN

## 2013-11-26 ENCOUNTER — Encounter: Payer: Self-pay | Admitting: Internal Medicine

## 2013-11-26 ENCOUNTER — Other Ambulatory Visit: Payer: Self-pay

## 2013-11-26 ENCOUNTER — Other Ambulatory Visit (INDEPENDENT_AMBULATORY_CARE_PROVIDER_SITE_OTHER): Payer: Medicare Other

## 2013-11-26 DIAGNOSIS — I700 Atherosclerosis of aorta: Secondary | ICD-10-CM

## 2013-11-26 DIAGNOSIS — R000 Tachycardia, unspecified: Secondary | ICD-10-CM

## 2013-11-26 DIAGNOSIS — I10 Essential (primary) hypertension: Secondary | ICD-10-CM

## 2013-11-26 DIAGNOSIS — I059 Rheumatic mitral valve disease, unspecified: Secondary | ICD-10-CM

## 2013-11-26 DIAGNOSIS — I359 Nonrheumatic aortic valve disorder, unspecified: Secondary | ICD-10-CM

## 2013-11-26 DIAGNOSIS — I7789 Other specified disorders of arteries and arterioles: Secondary | ICD-10-CM

## 2013-11-30 ENCOUNTER — Telehealth: Payer: Self-pay | Admitting: Internal Medicine

## 2013-11-30 ENCOUNTER — Encounter: Payer: Self-pay | Admitting: *Deleted

## 2013-11-30 DIAGNOSIS — M8580 Other specified disorders of bone density and structure, unspecified site: Secondary | ICD-10-CM

## 2013-11-30 NOTE — Telephone Encounter (Signed)
Letter mailed

## 2013-11-30 NOTE — Telephone Encounter (Signed)
Bone Density scores received, she has osteopenia,  Moderate.  Would repeat in 2 years and consider therapy then if there is a significant change. Continue calcium, vitamin d and weight bearing exercise on a regular basis.  

## 2013-12-09 ENCOUNTER — Ambulatory Visit (INDEPENDENT_AMBULATORY_CARE_PROVIDER_SITE_OTHER): Payer: Medicare Other | Admitting: Internal Medicine

## 2013-12-09 ENCOUNTER — Encounter (INDEPENDENT_AMBULATORY_CARE_PROVIDER_SITE_OTHER): Payer: Self-pay

## 2013-12-09 ENCOUNTER — Encounter: Payer: Self-pay | Admitting: Internal Medicine

## 2013-12-09 VITALS — BP 128/82 | HR 75 | Temp 97.7°F | Resp 16 | Wt 146.2 lb

## 2013-12-09 DIAGNOSIS — I10 Essential (primary) hypertension: Secondary | ICD-10-CM

## 2013-12-09 DIAGNOSIS — R000 Tachycardia, unspecified: Secondary | ICD-10-CM

## 2013-12-09 DIAGNOSIS — G4700 Insomnia, unspecified: Secondary | ICD-10-CM

## 2013-12-09 DIAGNOSIS — IMO0001 Reserved for inherently not codable concepts without codable children: Secondary | ICD-10-CM

## 2013-12-09 DIAGNOSIS — I498 Other specified cardiac arrhythmias: Secondary | ICD-10-CM

## 2013-12-09 DIAGNOSIS — E785 Hyperlipidemia, unspecified: Secondary | ICD-10-CM

## 2013-12-09 NOTE — Patient Instructions (Addendum)
You need 2000 units D3 daily through April,  Then 1000 units daily thereafter for summer and fall  You need 1200 mg calcium daily,  Get half through diet   Daily walking and usien low weights to strengthen arms  Repeat DEXA in 2 years.   Trial of melatonin  5 mg one hour before bedtime  To help your sleep cycle.  If your sleep is not improved  in 2 or 3 weeks , call me and  we will try  remeron,  Which is a mild antidepressant that helps with insomnia and and poor appetite

## 2013-12-09 NOTE — Progress Notes (Signed)
Patient ID: Sally Morgan, female   DOB: 07/03/1946, 68 y.o.   MRN: [id number]  Patient Active Problem List   Diagnosis Date Noted  . Insomnia 12/10/2013  . Osteopenia 11/30/2013  . Sinus tachycardia 11/17/2013  . Aortic atherosclerosis 11/17/2013  . Other and unspecified hyperlipidemia 11/08/2013  . Encounter for preventive health examination 11/03/2013  . White coat hypertension 11/02/2013  . Chest pain, unspecified 11/02/2013  . DVT femoral (deep venous thrombosis) with thrombophlebitis 11/02/2013  . Adenomatous polyp of colon 02/25/2012  . Breast screening, unspecified 11/25/2011  . Neutropenia 11/25/2011  . Neuropathy, cervical plexus 11/25/2011  . Arthritis   . Diverticulitis, colon   . GERD (gastroesophageal reflux disease)   . LIPOMA 03/29/2008  . VARICOSE VEINS, LOWER EXTREMITIES 03/29/2008  . ACTINIC KERATOSIS 03/29/2008    Subjective:  CC:   Chief Complaint  Patient presents with  . Follow-up    BP meds    HPI:   Sally Morgan is a 68 y.o. female who presents for   Follow up on several issues issues raised at her annual preventive exam several months ago. She was referred to cardiology for cardiac evaluation given  findings on chest CT which suggested bilateral dilated atria. The chest CT was done to rule out PE given her report of an episode of severe scapular chest pain accompanied by tachycardia and significantly elevated blood pressure too high for her home machine to register. Symptoms had occurred  in the setting of recent DVT.Marland Kitchen She is currently undergoing cardiology evaluation  for low EF.    2) Hypertension /hyperlipidemia: She was prescribed  Low dose lisinopril and simvastatin by me for mildly elevated blood pressure and  cholesterol but has discussed these medications with Dr. Rockey Situ and has decided based on her conversations that she does not really need them, which sits her natural inclination to avoid medications.  3) Insomnia for years,  Since husband  died . Wakes up at 3 or 4 am every night.  Difficulty going back to sleep. No daytime hypersomnolence. No history of snoring. Has never tried melatonin. Is not interested in medications.    Past Medical History  Diagnosis Date  . Arthritis   . Diverticulitis, colon   . GERD (gastroesophageal reflux disease)     Past Surgical History  Procedure Laterality Date  . Cesarean section      x 2        The following portions of the patient's history were reviewed and updated as appropriate: Allergies, current medications, and problem list.    Review of Systems:   Patient denies headache, fevers, malaise, unintentional weight loss, skin rash, eye pain, sinus congestion and sinus pain, sore throat, dysphagia,  hemoptysis , cough, dyspnea, wheezing, chest pain, palpitations, orthopnea, edema, abdominal pain, nausea, melena, diarrhea, constipation, flank pain, dysuria, hematuria, urinary  Frequency, nocturia, numbness, tingling, seizures,  Focal weakness, Loss of consciousness,  Tremor, insomnia, depression, anxiety, and suicidal ideation.     History   Social History  . Marital Status: Widowed    Spouse Name: N/A    Number of Children: N/A  . Years of Education: N/A   Occupational History  . Not on file.   Social History Main Topics  . Smoking status: Never Smoker   . Smokeless tobacco: Not on file  . Alcohol Use: 1.5 oz/week    3 drink(s) per week  . Drug Use: No  . Sexual Activity: No   Other Topics Concern  . Not on file  Social History Narrative  . No narrative on file    Objective:  Filed Vitals:   12/09/13 1448  BP: 128/82  Pulse: 75  Temp: 97.7 F (36.5 C)  Resp: 16     General appearance: alert, cooperative and appears stated age Ears: normal TM's and external ear canals both ears Throat: lips, mucosa, and tongue normal; teeth and gums normal Neck: no adenopathy, no carotid bruit, supple, symmetrical, trachea midline and thyroid not enlarged,  symmetric, no tenderness/mass/nodules Back: symmetric, no curvature. ROM normal. No CVA tenderness. Lungs: clear to auscultation bilaterally Heart: regular rate and rhythm, S1, S2 normal, no murmur, click, rub or gallop Abdomen: soft, non-tender; bowel sounds normal; no masses,  no organomegaly Pulses: 2+ and symmetric Skin: Skin color, texture, turgor normal. No rashes or lesions Lymph nodes: Cervical, supraclavicular, and axillary nodes normal.  Assessment and Plan:  White coat hypertension She does not want to continue lisinopril since her home blood pressures have been 120 at rest and only elevated with exertion.  Sinus tachycardia Episodic, managed with when necessary propanolol. Cardiac workup is underway for cardiomyopathy.  Other and unspecified hyperlipidemia She prefers to defer statin therapy at this time. She is willing to revisit the issue if cardiology reduce her CT scan and feels that she has an increased cardiac calcium score  Insomnia We reviewed her sleep habits and screen her for depression and anxiety. No deficits or problems were found. We discussed several options, none of which she was particularly interested in. Recommended trial 5 mg melatonin for several weeks.    Updated Medication List Outpatient Encounter Prescriptions as of 12/09/2013  Medication Sig  . aspirin 81 MG tablet Take 81 mg by mouth daily.  Marland Kitchen lisinopril (PRINIVIL,ZESTRIL) 10 MG tablet Take 0.5 mg by mouth daily.  . propranolol (INDERAL) 20 MG tablet Take 1 tablet (20 mg total) by mouth 3 (three) times daily as needed.  . [DISCONTINUED] lisinopril (PRINIVIL,ZESTRIL) 10 MG tablet Take 1 tablet (10 mg total) by mouth daily.  Marland Kitchen aspirin EC 325 MG tablet Take 1 tablet (325 mg total) by mouth daily.  . simvastatin (ZOCOR) 20 MG tablet Take 1 tablet (20 mg total) by mouth daily.  . Tdap (BOOSTRIX) 5-2.5-18.5 LF-MCG/0.5 injection Inject 0.5 mLs into the muscle once.  . zoster vaccine live, PF,  (ZOSTAVAX) 03491 UNT/0.65ML injection Inject 19,400 Units into the skin once.     No orders of the defined types were placed in this encounter.    No Follow-up on file.

## 2013-12-10 DIAGNOSIS — G4700 Insomnia, unspecified: Secondary | ICD-10-CM | POA: Insufficient documentation

## 2013-12-10 NOTE — Assessment & Plan Note (Signed)
We reviewed her sleep habits and screen her for depression and anxiety. No deficits or problems were found. We discussed several options, none of which she was particularly interested in. Recommended trial 5 mg melatonin for several weeks.

## 2013-12-10 NOTE — Assessment & Plan Note (Signed)
Episodic, managed with when necessary propanolol. Cardiac workup is underway for cardiomyopathy.

## 2013-12-10 NOTE — Assessment & Plan Note (Signed)
She prefers to defer statin therapy at this time. She is willing to revisit the issue if cardiology reduce her CT scan and feels that she has an increased cardiac calcium score

## 2013-12-10 NOTE — Assessment & Plan Note (Addendum)
She does not want to continue lisinopril since her home blood pressures have been 120 at rest and only elevated with exertion.

## 2013-12-11 ENCOUNTER — Telehealth: Payer: Self-pay | Admitting: Internal Medicine

## 2013-12-11 NOTE — Telephone Encounter (Signed)
Relevant patient education mailed to patient.  

## 2014-03-31 ENCOUNTER — Encounter: Payer: Self-pay | Admitting: Internal Medicine

## 2014-03-31 ENCOUNTER — Ambulatory Visit (INDEPENDENT_AMBULATORY_CARE_PROVIDER_SITE_OTHER): Payer: Medicare Other | Admitting: Internal Medicine

## 2014-03-31 VITALS — BP 118/78 | HR 72 | Temp 97.9°F | Resp 16 | Ht 65.5 in | Wt 145.0 lb

## 2014-03-31 DIAGNOSIS — T148XXA Other injury of unspecified body region, initial encounter: Secondary | ICD-10-CM

## 2014-03-31 DIAGNOSIS — T753XXA Motion sickness, initial encounter: Secondary | ICD-10-CM

## 2014-03-31 DIAGNOSIS — I10 Essential (primary) hypertension: Secondary | ICD-10-CM

## 2014-03-31 DIAGNOSIS — R112 Nausea with vomiting, unspecified: Secondary | ICD-10-CM

## 2014-03-31 DIAGNOSIS — A09 Infectious gastroenteritis and colitis, unspecified: Secondary | ICD-10-CM

## 2014-03-31 LAB — COMPREHENSIVE METABOLIC PANEL
ALT: 20 U/L (ref 0–35)
AST: 23 U/L (ref 0–37)
Albumin: 4.4 g/dL (ref 3.5–5.2)
Alkaline Phosphatase: 72 U/L (ref 39–117)
BUN: 13 mg/dL (ref 6–23)
CHLORIDE: 103 meq/L (ref 96–112)
CO2: 29 meq/L (ref 19–32)
Calcium: 9.6 mg/dL (ref 8.4–10.5)
Creatinine, Ser: 0.6 mg/dL (ref 0.4–1.2)
GFR: 100.05 mL/min (ref >60.00)
Glucose, Bld: 99 mg/dL (ref 70–99)
Potassium: 3.8 mEq/L (ref 3.5–5.1)
Sodium: 137 mEq/L (ref 135–145)
TOTAL PROTEIN: 7.3 g/dL (ref 6.0–8.3)
Total Bilirubin: 0.5 mg/dL (ref 0.2–1.2)

## 2014-03-31 MED ORDER — LISINOPRIL 5 MG PO TABS: 5.0000 mg | ORAL_TABLET | Freq: Every day | ORAL | Status: DC

## 2014-03-31 MED ORDER — SCOPOLAMINE 1 MG/3DAYS TD PT72: 1.0000 | MEDICATED_PATCH | TRANSDERMAL | Status: DC

## 2014-03-31 MED ORDER — PROMETHAZINE HCL 12.5 MG RE SUPP: 12.5000 mg | Freq: Four times a day (QID) | RECTAL | Status: DC | PRN

## 2014-03-31 MED ORDER — PROMETHAZINE HCL 25 MG PO TABS: 25.0000 mg | ORAL_TABLET | Freq: Three times a day (TID) | ORAL | Status: DC | PRN

## 2014-03-31 MED ORDER — CIPROFLOXACIN HCL 500 MG PO TABS: 500.0000 mg | ORAL_TABLET | Freq: Two times a day (BID) | ORAL | Status: DC

## 2014-03-31 NOTE — Patient Instructions (Addendum)
Try 1% hydrocortisone cream and benadryl cream for the itching  The steroid cream you have is too potent!!  it's causing your skin to be too fragile   Try spraying your plants the day before you prune to knock down the pests  I have given you medications to take with you on your cruise for  Seasickness prevention (scopalamin patch)  Management of nausea with or without vomiting (promethazine pills and suppositories)  traveler's diarrhea or for  UTI (cipro)

## 2014-03-31 NOTE — Progress Notes (Signed)
Pre-visit discussion using our clinic review tool. No additional management support is needed unless otherwise documented below in the visit note.  

## 2014-04-01 ENCOUNTER — Encounter: Payer: Self-pay | Admitting: *Deleted

## 2014-04-02 DIAGNOSIS — T148XXA Other injury of unspecified body region, initial encounter: Secondary | ICD-10-CM | POA: Insufficient documentation

## 2014-04-02 DIAGNOSIS — T753XXA Motion sickness, initial encounter: Secondary | ICD-10-CM | POA: Insufficient documentation

## 2014-04-02 NOTE — Assessment & Plan Note (Signed)
She had a normal CBC during her annual physical,m and she is not experiencing joint swelling, spontaneous bruising  or gums bleeding  despite flossing.  Advised her to stop using the hight potency steroid cream on her pruritic bug bites skin as it may be causing the skin to be more fragile and easily bruised during the trauma of scratching.  Advised to use benadryl skin cream and preventive measures to prevent bug bites (clothing,  Skin so soft,  DEET containing topicals if necessary)

## 2014-04-02 NOTE — Progress Notes (Signed)
Patient ID: Sally Morgan, female   DOB: August 30, 1946, 68 y.o.   MRN: [id number]  Patient Active Problem List   Diagnosis Date Noted  . Superficial bruising 04/02/2014  . Insomnia 12/10/2013  . Osteopenia 11/30/2013  . Sinus tachycardia 11/17/2013  . Aortic atherosclerosis 11/17/2013  . Other and unspecified hyperlipidemia 11/08/2013  . Encounter for preventive health examination 11/03/2013  . White coat hypertension 11/02/2013  . Chest pain, unspecified 11/02/2013  . DVT femoral (deep venous thrombosis) with thrombophlebitis 11/02/2013  . Adenomatous polyp of colon 02/25/2012  . Breast screening, unspecified 11/25/2011  . Neutropenia 11/25/2011  . Neuropathy, cervical plexus 11/25/2011  . Arthritis   . Diverticulitis, colon   . GERD (gastroesophageal reflux disease)   . LIPOMA 03/29/2008  . VARICOSE VEINS, LOWER EXTREMITIES 03/29/2008  . ACTINIC KERATOSIS 03/29/2008    Subjective:  CC:   Chief Complaint  Patient presents with  . Acute Visit    insect bites, itching, bruising around some bites.    HPI:   Sally Morgan is a 68 y.o. female who presents for Bruising around bug bites.  Patient has been working her garden a lot and has been getting numerous pruritic bug bites that result in significant bruising. She has been applying a high potency topical steroid often to manage the intense itching.  Her gums are not bleeding despite flossing daily.    Past Medical History  Diagnosis Date  . Arthritis   . Diverticulitis, colon   . GERD (gastroesophageal reflux disease)     Past Surgical History  Procedure Laterality Date  . Cesarean section      x 2        The following portions of the patient's history were reviewed and updated as appropriate: Allergies, current medications, and problem list.    Review of Systems:   Patient denies headache, fevers, malaise, unintentional weight loss, skin rash, eye pain, sinus congestion and sinus pain, sore throat, dysphagia,   hemoptysis , cough, dyspnea, wheezing, chest pain, palpitations, orthopnea, edema, abdominal pain, nausea, melena, diarrhea, constipation, flank pain, dysuria, hematuria, urinary  Frequency, nocturia, numbness, tingling, seizures,  Focal weakness, Loss of consciousness,  Tremor, insomnia, depression, anxiety, and suicidal ideation.     History   Social History  . Marital Status: Widowed    Spouse Name: N/A    Number of Children: N/A  . Years of Education: N/A   Occupational History  . Not on file.   Social History Main Topics  . Smoking status: Never Smoker   . Smokeless tobacco: Not on file  . Alcohol Use: 1.5 oz/week    3 drink(s) per week  . Drug Use: No  . Sexual Activity: No   Other Topics Concern  . Not on file   Social History Narrative  . No narrative on file    Objective:  Filed Vitals:   03/31/14 0919  BP: 118/78  Pulse: 72  Temp: 97.9 F (36.6 C)  Resp: 16     General appearance: alert, cooperative and appears stated age Throat: lips, mucosa, and tongue normal; teeth and gums normal Neck: no adenopathy, no carotid bruit, supple, symmetrical, trachea midline and thyroid not enlarged, symmetric, no tenderness/mass/nodules Lungs: clear to auscultation bilaterally Heart: regular rate and rhythm, S1, S2 normal, no murmur, click, rub or gallop Skin: numerous erythematous papular whelps with ecchymotic skin changes in various stages of resolution on arms and legs and torso  Lymph nodes: Cervical, supraclavicular, and axillary nodes normal.  Assessment and Plan:  Superficial bruising She had a normal CBC during her annual physical,m and she is not experiencing joint swelling, spontaneous bruising  or gums bleeding  despite flossing.  Advised her to stop using the hight potency steroid cream on her pruritic bug bites skin as it may be causing the skin to be more fragile and easily bruised during the trauma of scratching.  Advised to use benadryl skin cream and  preventive measures to prevent bug bites (clothing,  Skin so soft,  DEET containing topicals if necessary)  Travel sickness Patient is planning an extended cruise and anticipating sea sickness.  Medications given including transcopalamine patches, phenergan,  And cipro for traveler's diarrhea.    Updated Medication List Outpatient Encounter Prescriptions as of 03/31/2014  Medication Sig  . aspirin 81 MG tablet Take 81 mg by mouth daily.  Marland Kitchen augmented betamethasone dipropionate (DIPROLENE-AF) 0.05 % cream Apply 1 application topically 3 (three) times daily as needed.  Marland Kitchen lisinopril (PRINIVIL,ZESTRIL) 5 MG tablet Take 1 tablet (5 mg total) by mouth daily.  . propranolol (INDERAL) 20 MG tablet Take 1 tablet (20 mg total) by mouth 3 (three) times daily as needed.  . [DISCONTINUED] lisinopril (PRINIVIL,ZESTRIL) 10 MG tablet Take 0.5 mg by mouth daily.  . ciprofloxacin (CIPRO) 500 MG tablet Take 1 tablet (500 mg total) by mouth 2 (two) times daily.  . promethazine (PHENERGAN) 12.5 MG suppository Place 1 suppository (12.5 mg total) rectally every 6 (six) hours as needed for nausea or vomiting.  . promethazine (PHENERGAN) 25 MG tablet Take 1 tablet (25 mg total) by mouth every 8 (eight) hours as needed for nausea or vomiting.  Marland Kitchen scopolamine (TRANSDERM-SCOP) 1 MG/3DAYS Place 1 patch (1.5 mg total) onto the skin every 3 (three) days.  . simvastatin (ZOCOR) 20 MG tablet Take 1 tablet (20 mg total) by mouth daily.  . Tdap (BOOSTRIX) 5-2.5-18.5 LF-MCG/0.5 injection Inject 0.5 mLs into the muscle once.  . zoster vaccine live, PF, (ZOSTAVAX) 38453 UNT/0.65ML injection Inject 19,400 Units into the skin once.  . [DISCONTINUED] aspirin EC 325 MG tablet Take 1 tablet (325 mg total) by mouth daily.

## 2014-04-02 NOTE — Assessment & Plan Note (Signed)
Patient is planning an extended cruise and anticipating sea sickness.  Medications given including transcopalamine patches, phenergan,  And cipro for traveler's diarrhea.

## 2014-07-26 ENCOUNTER — Ambulatory Visit (INDEPENDENT_AMBULATORY_CARE_PROVIDER_SITE_OTHER): Payer: Medicare Other | Admitting: Cardiovascular Disease

## 2014-07-26 ENCOUNTER — Encounter: Payer: Self-pay | Admitting: Cardiovascular Disease

## 2014-07-26 VITALS — BP 140/80 | HR 69 | Ht 65.5 in | Wt 148.5 lb

## 2014-07-26 DIAGNOSIS — I420 Dilated cardiomyopathy: Secondary | ICD-10-CM | POA: Insufficient documentation

## 2014-07-26 DIAGNOSIS — I700 Atherosclerosis of aorta: Secondary | ICD-10-CM

## 2014-07-26 DIAGNOSIS — I471 Supraventricular tachycardia: Secondary | ICD-10-CM

## 2014-07-26 DIAGNOSIS — R000 Tachycardia, unspecified: Secondary | ICD-10-CM

## 2014-07-26 DIAGNOSIS — E785 Hyperlipidemia, unspecified: Secondary | ICD-10-CM

## 2014-07-26 DIAGNOSIS — R030 Elevated blood-pressure reading, without diagnosis of hypertension: Secondary | ICD-10-CM

## 2014-07-26 DIAGNOSIS — IMO0001 Reserved for inherently not codable concepts without codable children: Secondary | ICD-10-CM

## 2014-07-26 DIAGNOSIS — I77810 Thoracic aortic ectasia: Secondary | ICD-10-CM | POA: Insufficient documentation

## 2014-07-26 NOTE — Assessment & Plan Note (Signed)
Interestingly she has a 4.2 cm aortic root. No murmur concerning for bicuspid aortic valve though this is not a certainty. Aortic valve not well visualized on prior echocardiogram. Repeat echocardiogram April 2016 to evaluate her aortic root

## 2014-07-26 NOTE — Patient Instructions (Addendum)
You are doing well. No medication changes were made.  We will schedule an echocardiogram in 6 months in April 2016  Please call us if you have new issues that need to be addressed before your next appt.  Your physician wants you to follow-up in: 6 months after your echocardiogram You will receive a reminder letter in the mail two months in advance. If you don't receive a letter, please call our office to schedule the follow-up appointment.  Your next appointment will be scheduled in our new office located at :  [facility]  [address], [place]  [place], [place] [address]

## 2014-07-26 NOTE — Assessment & Plan Note (Signed)
CT scan from earlier in 2015 was again reviewed with her. She does not want a cholesterol medication

## 2014-07-26 NOTE — Assessment & Plan Note (Signed)
She's not having frequent episodes. Encouraged her to use the propranolol as needed.

## 2014-07-26 NOTE — Assessment & Plan Note (Signed)
She's not interested in a cholesterol medication. She prefers diet and exercise

## 2014-07-26 NOTE — Assessment & Plan Note (Addendum)
Ejection fraction 45-50%. Left ventricle also mildly dilated. Repeat echocardiogram ordered for April 2016. No new symptoms concerning for worsening heart failure.

## 2014-07-26 NOTE — Progress Notes (Signed)
Patient ID: Sally Morgan, female    DOB: 1946-04-23, 68 y.o.   MRN: [id number]  HPI Comments: [name] is a pleasant 4 a-year-old woman who has a history of hyperlipidemia, previously lost her husband 7 years ago to a cardiac issues, who had vein ablation in on the left with post procedure DVT noted on ultrasound. She was initially started on aspirin though this was changed to xarelto twice a day after the thrombus extended. She was given this for 2 weeks and then anticoagulation was held . She presents for routine followup  Prior CT scan in early 2015 showing cardiac dilation, dilated aortic root, mild aortic plaquing in the ascending and descending aorta, no PE  Echocardiogram in followup showed ejection fraction 45-50%, mildly dilated left ventricle, 4.2 cm aortic root    previously reported having episodes of significant tachycardia. She was given propranolol to take as needed. In followup today, she has very rare episodes of palpitations. In general has not needed to take propranolol. She does not like to take medications in general and does not want a cholesterol medication. She does report a cough on her lisinopril and decrease the dose down to 2.5 mg daily. Blood pressure at home has typically been in the 120/70 range She is relatively active at baseline with no complaints. Denies having any chest pain with exertion.   EKG shows normal sinus rhythm with rate 69 beats per minute, T-wave abnormality in the anterior precordial leads     Outpatient Encounter Prescriptions as of 07/26/2014  Medication Sig  . aspirin 81 MG tablet Take 81 mg by mouth daily.  . ciprofloxacin (CIPRO) 500 MG tablet Take 1 tablet (500 mg total) by mouth 2 (two) times daily.  Marland Kitchen lisinopril (PRINIVIL,ZESTRIL) 5 MG tablet Take 2.5 mg by mouth daily.  . propranolol (INDERAL) 20 MG tablet Take 20 mg by mouth 3 (three) times daily as needed.   Review of Systems  Constitutional: Negative.   HENT: Negative.    Eyes: Negative.   Respiratory: Negative.   Cardiovascular: Negative.   Gastrointestinal: Negative.   Endocrine: Negative.   Musculoskeletal: Negative.   Skin: Negative.   Allergic/Immunologic: Negative.   Neurological: Negative.   Hematological: Negative.   Psychiatric/Behavioral: The patient is nervous/anxious.   All other systems reviewed and are negative.   BP 140/80  Pulse 69  Ht 5' 5.5" (1.664 m)  Wt 148 lb 8 oz (67.359 kg)  BMI 24.33 kg/m2  Physical Exam  Nursing note and vitals reviewed. Constitutional: She is oriented to person, place, and time. She appears well-developed and well-nourished.  HENT:  Head: Normocephalic.  Nose: Nose normal.  Mouth/Throat: Oropharynx is clear and moist.  Eyes: Conjunctivae are normal. Pupils are equal, round, and reactive to light.  Neck: Normal range of motion. Neck supple. No JVD present.  Cardiovascular: Normal rate, regular rhythm, S1 normal, S2 normal, normal heart sounds and intact distal pulses.  Exam reveals no gallop and no friction rub.   No murmur heard. Pulmonary/Chest: Effort normal and breath sounds normal. No respiratory distress. She has no wheezes. She has no rales. She exhibits no tenderness.  Abdominal: Soft. Bowel sounds are normal. She exhibits no distension. There is no tenderness.  Musculoskeletal: Normal range of motion. She exhibits no edema and no tenderness.  Lymphadenopathy:    She has no cervical adenopathy.  Neurological: She is alert and oriented to person, place, and time. Coordination normal.  Skin: Skin is warm and dry. No rash  noted. No erythema.  Psychiatric: She has a normal mood and affect. Her behavior is normal. Judgment and thought content normal.    Assessment and Plan       Is

## 2014-07-26 NOTE — Assessment & Plan Note (Signed)
She reports blood pressure has been well controlled at home. She did report a cough on lisinopril 5 mg daily. Improved on 2.5 mg daily

## 2015-01-25 ENCOUNTER — Ambulatory Visit: Payer: Medicare Other | Admitting: Cardiovascular Disease

## 2015-01-26 ENCOUNTER — Ambulatory Visit: Payer: Medicare Other | Admitting: Cardiovascular Disease

## 2015-02-07 ENCOUNTER — Other Ambulatory Visit: Payer: Self-pay | Admitting: Cardiovascular Disease

## 2015-02-07 DIAGNOSIS — I77810 Thoracic aortic ectasia: Secondary | ICD-10-CM

## 2015-02-07 DIAGNOSIS — I420 Dilated cardiomyopathy: Secondary | ICD-10-CM

## 2015-02-07 DIAGNOSIS — I700 Atherosclerosis of aorta: Secondary | ICD-10-CM

## 2015-02-28 ENCOUNTER — Ambulatory Visit (INDEPENDENT_AMBULATORY_CARE_PROVIDER_SITE_OTHER): Payer: PPO

## 2015-02-28 ENCOUNTER — Other Ambulatory Visit: Payer: Self-pay | Admitting: Cardiovascular Disease

## 2015-02-28 ENCOUNTER — Other Ambulatory Visit: Payer: Self-pay

## 2015-02-28 DIAGNOSIS — I420 Dilated cardiomyopathy: Secondary | ICD-10-CM

## 2015-02-28 DIAGNOSIS — I77810 Thoracic aortic ectasia: Secondary | ICD-10-CM

## 2015-02-28 DIAGNOSIS — I700 Atherosclerosis of aorta: Secondary | ICD-10-CM

## 2015-03-02 ENCOUNTER — Encounter: Payer: Self-pay | Admitting: Cardiovascular Disease

## 2015-03-02 ENCOUNTER — Ambulatory Visit (INDEPENDENT_AMBULATORY_CARE_PROVIDER_SITE_OTHER): Payer: PPO | Admitting: Cardiovascular Disease

## 2015-03-02 VITALS — BP 130/82 | HR 67 | Ht 65.0 in | Wt 156.0 lb

## 2015-03-02 DIAGNOSIS — I420 Dilated cardiomyopathy: Secondary | ICD-10-CM | POA: Diagnosis not present

## 2015-03-02 DIAGNOSIS — R030 Elevated blood-pressure reading, without diagnosis of hypertension: Secondary | ICD-10-CM

## 2015-03-02 DIAGNOSIS — R000 Tachycardia, unspecified: Secondary | ICD-10-CM | POA: Diagnosis not present

## 2015-03-02 DIAGNOSIS — I700 Atherosclerosis of aorta: Secondary | ICD-10-CM

## 2015-03-02 DIAGNOSIS — IMO0001 Reserved for inherently not codable concepts without codable children: Secondary | ICD-10-CM

## 2015-03-02 DIAGNOSIS — I77810 Thoracic aortic ectasia: Secondary | ICD-10-CM | POA: Diagnosis not present

## 2015-03-02 DIAGNOSIS — I471 Supraventricular tachycardia: Secondary | ICD-10-CM

## 2015-03-02 DIAGNOSIS — E785 Hyperlipidemia, unspecified: Secondary | ICD-10-CM

## 2015-03-02 MED ORDER — LOSARTAN POTASSIUM 25 MG PO TABS: 25.0000 mg | ORAL_TABLET | Freq: Every day | ORAL | Status: DC

## 2015-03-02 NOTE — Assessment & Plan Note (Signed)
She denies any episodes of tachycardia. Previously was given propranolol to take as needed This was in the setting of her DVT. Unable to exclude PTE the time. Also noted to have cardiomyopathy with ejection fraction 40%

## 2015-03-02 NOTE — Assessment & Plan Note (Signed)
She has a cough on lisinopril. We will change to losartan 25 mg daily

## 2015-03-02 NOTE — Patient Instructions (Signed)
You are doing well.  Please hold the lisinopril Start losartan one a day  Research Red Yeast Rice for cholesterol (vitamin)  We can repeat echocardiogram in 1 year  Please call us if you have new issues that need to be addressed before your next appt.  Your physician wants you to follow-up in: 12 months.  You will receive a reminder letter in the mail two months in advance. If you don't receive a letter, please call our office to schedule the follow-up appointment.

## 2015-03-02 NOTE — Assessment & Plan Note (Signed)
She prefers no prescription cholesterol medication. She will try red yeast rice

## 2015-03-02 NOTE — Assessment & Plan Note (Signed)
Aortic root is stable in size 4.1 cm. Repeat echocardiogram next year at her request

## 2015-03-02 NOTE — Assessment & Plan Note (Signed)
Ejection fraction now normal on recent echocardiogram. No further testing needed

## 2015-03-02 NOTE — Progress Notes (Signed)
Patient ID: Sally Morgan, female    DOB: 01/26/1946, 69 y.o.   MRN: [id number]  HPI Comments: Sally Morgan  is a pleasant 69 a-year-old woman who has a history of hyperlipidemia, previously lost her husband to a cardiac issues, who had vein ablation in on the left with post procedure DVT noted on ultrasound. She was initially started on aspirin though this was changed to xarelto twice a day after the thrombus extended. She was given this for 2 weeks and then anticoagulation was held . She presents for routine followup of her hyperlipidemia   prior echocardiogram showing mildly dilated aortic root  she had recent echocardiogram several weeks ago showing stable aortic root 4.1 cm  Prior CT scan in early 2015 showing cardiac dilation, dilated aortic root, mild aortic plaquing in the ascending and descending aorta, no PE Echocardiogram in 2015 showed aortic root 4.2 cm with ejection fraction 45-50%, mildly dilated left ventricle (possible stress cardiomyopathy)  In follow-up today she reports that she feels well with no complaints  she does report a cough on lisinopril. She is otherwise active, weight is stable in the past she has declined a cholesterol medication despite aortic plaquing, mild  EKG on today's visit shows normal sinus rhythm with rate 67 bpm, diffuse T-wave abnormality anterior precordial leads, inferior leads, no change from prior study  No Known Allergies  Current Outpatient Prescriptions on File Prior to Visit  Medication Sig Dispense Refill  . aspirin 81 MG tablet Take 81 mg by mouth daily.    . ciprofloxacin (CIPRO) 500 MG tablet Take 1 tablet (500 mg total) by mouth 2 (two) times daily. 14 tablet 0  . propranolol (INDERAL) 20 MG tablet Take 20 mg by mouth 3 (three) times daily as needed.     No current facility-administered medications on file prior to visit.    Past Medical History  Diagnosis Date  . Arthritis   . Diverticulitis, colon   . GERD (gastroesophageal  reflux disease)     Past Surgical History  Procedure Laterality Date  . Cesarean section      x 2     Social History  reports that she has never smoked. She does not have any smokeless tobacco history on file. She reports that she drinks about 1.5 oz of alcohol per week. She reports that she does not use illicit drugs.  Family History family history includes Appendicitis in her father; Cancer (age of onset: 3) in her brother; Early death in her father and mother; Tuberculosis in her mother.   Review of Systems  Constitutional: Negative.   Respiratory: Negative.   Cardiovascular: Negative.   Gastrointestinal: Negative.   Musculoskeletal: Negative.   Skin: Negative.   Neurological: Negative.   Hematological: Negative.   Psychiatric/Behavioral: Negative.   All other systems reviewed and are negative.   BP 130/82 mmHg  Pulse 67  Ht 5\' 5"  (1.651 m)  Wt 156 lb (70.761 kg)  BMI 25.96 kg/m2  Physical Exam  Constitutional: She is oriented to person, place, and time. She appears well-developed and well-nourished.  HENT:  Head: Normocephalic.  Nose: Nose normal.  Mouth/Throat: Oropharynx is clear and moist.  Eyes: Conjunctivae are normal. Pupils are equal, round, and reactive to light.  Neck: Normal range of motion. Neck supple. No JVD present.  Cardiovascular: Normal rate, regular rhythm, S1 normal, S2 normal, normal heart sounds and intact distal pulses.  Exam reveals no gallop and no friction rub.   No murmur heard. Pulmonary/Chest:  Effort normal and breath sounds normal. No respiratory distress. She has no wheezes. She has no rales. She exhibits no tenderness.  Abdominal: Soft. Bowel sounds are normal. She exhibits no distension. There is no tenderness.  Musculoskeletal: Normal range of motion. She exhibits no edema or tenderness.  Lymphadenopathy:    She has no cervical adenopathy.  Neurological: She is alert and oriented to person, place, and time. Coordination normal.   Skin: Skin is warm and dry. No rash noted. No erythema.  Psychiatric: She has a normal mood and affect. Her behavior is normal. Judgment and thought content normal.    Assessment and Plan  Nursing note and vitals reviewed.

## 2015-10-12 ENCOUNTER — Ambulatory Visit (INDEPENDENT_AMBULATORY_CARE_PROVIDER_SITE_OTHER): Payer: PPO

## 2015-10-12 VITALS — BP 136/80 | HR 73 | Temp 97.4°F | Resp 12 | Ht 65.0 in | Wt 158.8 lb

## 2015-10-12 DIAGNOSIS — E785 Hyperlipidemia, unspecified: Secondary | ICD-10-CM

## 2015-10-12 DIAGNOSIS — Z0000 Encounter for general adult medical examination without abnormal findings: Secondary | ICD-10-CM

## 2015-10-12 DIAGNOSIS — Z1159 Encounter for screening for other viral diseases: Secondary | ICD-10-CM

## 2015-10-12 NOTE — Progress Notes (Signed)
Subjective:   Sally Morgan is a 69 y.o. female who presents for Medicare Annual (Subsequent) preventive examination.  Review of Systems:  No ROS.  Medicare Wellness Visit.  Cardiac Risk Factors include: advanced age (>27men, >44 women);hypertension     Objective:    Vitals: BP 136/80 mmHg  Pulse 73  Temp(Src) 97.4 F (36.3 C) (Oral)  Resp 12  Ht 5\' 5"  (1.651 m)  Wt 158 lb 12.8 oz (72.031 kg)  BMI 26.43 kg/m2  SpO2 97%  Tobacco History  Smoking status  . Never Smoker   Smokeless tobacco  . Not on file     Counseling given: Not Answered   Past Medical History  Diagnosis Date  . Arthritis   . Diverticulitis, colon   . GERD (gastroesophageal reflux disease)    Past Surgical History  Procedure Laterality Date  . Cesarean section      x 2    Family History  Problem Relation Age of Onset  . Cancer Brother 39    colon ca  . Early death Mother   . Tuberculosis Mother   . Early death Father   . Appendicitis Father    History  Sexual Activity  . Sexual Activity: Not Currently    Outpatient Encounter Prescriptions as of 10/12/2015  Medication Sig  . aspirin 81 MG tablet Take 81 mg by mouth daily.  Marland Kitchen losartan (COZAAR) 25 MG tablet Take 1 tablet (25 mg total) by mouth daily.  . [DISCONTINUED] ciprofloxacin (CIPRO) 500 MG tablet Take 1 tablet (500 mg total) by mouth 2 (two) times daily.  . [DISCONTINUED] propranolol (INDERAL) 20 MG tablet Take 20 mg by mouth 3 (three) times daily as needed.   No facility-administered encounter medications on file as of 10/12/2015.    Activities of Daily Living In your present state of health, do you have any difficulty performing the following activities: 10/12/2015  Hearing? N  Vision? N  Difficulty concentrating or making decisions? N  Walking or climbing stairs? N  Dressing or bathing? N  Doing errands, shopping? N  Preparing Food and eating ? N  Using the Toilet? N  In the past six months, have you accidently  leaked urine? N  Do you have problems with loss of bowel control? N  Managing your Medications? N  Managing your Finances? N  Housekeeping or managing your Housekeeping? N    Patient Care Team: Crecencio Mc, MD as PCP - General (Internal Medicine)    Assessment:   This is a routine wellness examination for Sally Morgan. The goal of the wellness visit is to assist the patient how to close the gaps in care and create a preventative care plan for the patient.   Osteoporosis risk reviewed.  Medications reviewed; taking without issues or barriers.   Safety issues reviewed; smoke detectors in the home. No firearms in the home. Wears seatbelts when driving or riding with others. No violence in the home.  No identified risk were noted; The patient was oriented x 3; appropriate in dress and manner and no objective failures at ADL's or IADL's.   Influenza vaccine, declined, per patient request.  TDAP and ZOSTAVAX vaccine postponed, per patient request.  Follow up with insurance and PCP.  Education provided.   Neutropenia NOS- stable and followed by PCP.  Patient Concerns:  Need for appointment for fasting labs and physical. Future fasting labs ordered:  Hep C Screening, Lipid Panel, CMET, CBC with diff, TSH, LDL Direct.  Appointment scheduled with  PCP.  Exercise Activities and Dietary recommendations Current Exercise Habits:: Home exercise routine, Type of exercise: walking, Time (Minutes): 45, Frequency (Times/Week): 3, Weekly Exercise (Minutes/Week): 135, Intensity: Mild  Goals    . Increase physical activity     Maintain walking regiment and start attending Sally Morgan program with husband as often as possible.      Fall Risk Fall Risk  10/12/2015 11/03/2013  Falls in the past year? No No   Depression Screen PHQ 2/9 Scores 10/12/2015 11/03/2013  PHQ - 2 Score 0 0     Cognitive Testing MMSE - Mini Mental State Exam 10/12/2015  Orientation to time 5  Orientation to  Place 5  Registration 3  Attention/ Calculation 5  Recall 3  Language- name 2 objects 2  Language- repeat 1  Language- follow 3 step command 3  Language- read & follow direction 1  Write a sentence 1  Copy design 1  Total score 30    Immunization History  Administered Date(s) Administered  . Influenza Whole 08/06/2010  . Pneumococcal Conjugate-13 11/02/2013  . Td 02/04/2003   Screening Tests Health Maintenance  Topic Date Due  . Hepatitis C Screening  October 20, 1945  . ZOSTAVAX  10/28/2006  . TETANUS/TDAP  02/03/2013  . INFLUENZA VACCINE  01/13/2016 (Originally 05/16/2015)  . MAMMOGRAM  11/25/2015  . COLONOSCOPY  02/24/2022  . DEXA SCAN  Completed      Plan:   End of life planning; Advance aging; Advanced directives discussed. Copy requested of current HCPOA/Living Will.   Return in January for fasting labs and physical.  During the course of the visit the patient was educated and counseled about the following appropriate screening and preventive services:   Vaccines to include Pneumoccal, Influenza, Hepatitis B, Td, Zostavax, HCV  Electrocardiogram  Cardiovascular Disease  Colorectal cancer screening  Bone density screening  Diabetes screening  Glaucoma screening  Mammography/PAP  Nutrition counseling   Patient Instructions (the written plan) was given to the patient.   Varney Biles, LPN  [id number]

## 2015-10-12 NOTE — Patient Instructions (Addendum)
[name],  Thank you for taking time to come for your Medicare Wellness Visit.  I appreciate your ongoing commitment to your health goals. Please review the following plan we discussed and let me know if I can assist you in the future.   Return in January for fasting labs and physical.  Happy New Year! Health Maintenance, Female Adopting a healthy lifestyle and getting preventive care can go a long way to promote health and wellness. Talk with your health care provider about what schedule of regular examinations is right for you. This is a good chance for you to check in with your provider about disease prevention and staying healthy. In between checkups, there are plenty of things you can do on your own. Experts have done a lot of research about which lifestyle changes and preventive measures are most likely to keep you healthy. Ask your health care provider for more information. WEIGHT AND DIET  Eat a healthy diet  Be sure to include plenty of vegetables, fruits, low-fat dairy products, and lean protein.  Do not eat a lot of foods high in solid fats, added sugars, or salt.  Get regular exercise. This is one of the most important things you can do for your health.  Most adults should exercise for at least 150 minutes each week. The exercise should increase your heart rate and make you sweat (moderate-intensity exercise).  Most adults should also do strengthening exercises at least twice a week. This is in addition to the moderate-intensity exercise.  Maintain a healthy weight  Body mass index (BMI) is a measurement that can be used to identify possible weight problems. It estimates body fat based on height and weight. Your health care provider can help determine your BMI and help you achieve or maintain a healthy weight.  For females 61 years of age and older:   A BMI below 18.5 is considered underweight.  A BMI of 18.5 to 24.9 is normal.  A BMI of 25 to 29.9 is considered  overweight.  A BMI of 30 and above is considered obese.  Watch levels of cholesterol and blood lipids  You should start having your blood tested for lipids and cholesterol at 68 years of age, then have this test every 5 years.  You may need to have your cholesterol levels checked more often if:  Your lipid or cholesterol levels are high.  You are older than 68 years of age.  You are at high risk for heart disease.  CANCER SCREENING   Lung Cancer  Lung cancer screening is recommended for adults 43-28 years old who are at high risk for lung cancer because of a history of smoking.  A yearly low-dose CT scan of the lungs is recommended for people who:  Currently smoke.  Have quit within the past 15 years.  Have at least a 30-pack-year history of smoking. A pack year is smoking an average of one pack of cigarettes a day for 1 year.  Yearly screening should continue until it has been 15 years since you quit.  Yearly screening should stop if you develop a health problem that would prevent you from having lung cancer treatment.  Breast Cancer  Practice breast self-awareness. This means understanding how your breasts normally appear and feel.  It also means doing regular breast self-exams. Let your health care provider know about any changes, no matter how small.  If you are in your 20s or 30s, you should have a clinical breast exam (CBE)  by a health care provider every 1-3 years as part of a regular health exam.  If you are 38 or older, have a CBE every year. Also consider having a breast X-ray (mammogram) every year.  If you have a family history of breast cancer, talk to your health care provider about genetic screening.  If you are at high risk for breast cancer, talk to your health care provider about having an MRI and a mammogram every year.  Breast cancer gene (BRCA) assessment is recommended for women who have family members with BRCA-related cancers. BRCA-related  cancers include:  Breast.  Ovarian.  Tubal.  Peritoneal cancers.  Results of the assessment will determine the need for genetic counseling and BRCA1 and BRCA2 testing. Cervical Cancer Your health care provider may recommend that you be screened regularly for cancer of the pelvic organs (ovaries, uterus, and vagina). This screening involves a pelvic examination, including checking for microscopic changes to the surface of your cervix (Pap test). You may be encouraged to have this screening done every 3 years, beginning at age 30.  For women ages 54-65, health care providers may recommend pelvic exams and Pap testing every 3 years, or they may recommend the Pap and pelvic exam, combined with testing for human papilloma virus (HPV), every 5 years. Some types of HPV increase your risk of cervical cancer. Testing for HPV may also be done on women of any age with unclear Pap test results.  Other health care providers may not recommend any screening for nonpregnant women who are considered low risk for pelvic cancer and who do not have symptoms. Ask your health care provider if a screening pelvic exam is right for you.  If you have had past treatment for cervical cancer or a condition that could lead to cancer, you need Pap tests and screening for cancer for at least 20 years after your treatment. If Pap tests have been discontinued, your risk factors (such as having a new sexual partner) need to be reassessed to determine if screening should resume. Some women have medical problems that increase the chance of getting cervical cancer. In these cases, your health care provider may recommend more frequent screening and Pap tests. Colorectal Cancer  This type of cancer can be detected and often prevented.  Routine colorectal cancer screening usually begins at 68 years of age and continues through 68 years of age.  Your health care provider may recommend screening at an earlier age if you have risk  factors for colon cancer.  Your health care provider may also recommend using home test kits to check for hidden blood in the stool.  A small camera at the end of a tube can be used to examine your colon directly (sigmoidoscopy or colonoscopy). This is done to check for the earliest forms of colorectal cancer.  Routine screening usually begins at age 72.  Direct examination of the colon should be repeated every 5-10 years through 68 years of age. However, you may need to be screened more often if early forms of precancerous polyps or small growths are found. Skin Cancer  Check your skin from head to toe regularly.  Tell your health care provider about any new moles or changes in moles, especially if there is a change in a mole's shape or color.  Also tell your health care provider if you have a mole that is larger than the size of a pencil eraser.  Always use sunscreen. Apply sunscreen liberally and repeatedly throughout the  day.  Protect yourself by wearing long sleeves, pants, a wide-brimmed hat, and sunglasses whenever you are outside. HEART DISEASE, DIABETES, AND HIGH BLOOD PRESSURE   High blood pressure causes heart disease and increases the risk of stroke. High blood pressure is more likely to develop in:  People who have blood pressure in the high end of the normal range (130-139/85-89 mm Hg).  People who are overweight or obese.  People who are African American.  If you are 51-35 years of age, have your blood pressure checked every 3-5 years. If you are 59 years of age or older, have your blood pressure checked every year. You should have your blood pressure measured twice--once when you are at a hospital or clinic, and once when you are not at a hospital or clinic. Record the average of the two measurements. To check your blood pressure when you are not at a hospital or clinic, you can use:  An automated blood pressure machine at a pharmacy.  A home blood pressure  monitor.  If you are between 82 years and 40 years old, ask your health care provider if you should take aspirin to prevent strokes.  Have regular diabetes screenings. This involves taking a blood sample to check your fasting blood sugar level.  If you are at a normal weight and have a low risk for diabetes, have this test once every three years after 68 years of age.  If you are overweight and have a high risk for diabetes, consider being tested at a younger age or more often. PREVENTING INFECTION  Hepatitis B  If you have a higher risk for hepatitis B, you should be screened for this virus. You are considered at high risk for hepatitis B if:  You were born in a country where hepatitis B is common. Ask your health care provider which countries are considered high risk.  Your parents were born in a high-risk country, and you have not been immunized against hepatitis B (hepatitis B vaccine).  You have HIV or AIDS.  You use needles to inject street drugs.  You live with someone who has hepatitis B.  You have had sex with someone who has hepatitis B.  You get hemodialysis treatment.  You take certain medicines for conditions, including cancer, organ transplantation, and autoimmune conditions. Hepatitis C  Blood testing is recommended for:  Everyone born from [birth year] through 1964.  Anyone with known risk factors for hepatitis C. Sexually transmitted infections (STIs)  You should be screened for sexually transmitted infections (STIs) including gonorrhea and chlamydia if:  You are sexually active and are younger than 68 years of age.  You are older than 68 years of age and your health care provider tells you that you are at risk for this type of infection.  Your sexual activity has changed since you were last screened and you are at an increased risk for chlamydia or gonorrhea. Ask your health care provider if you are at risk.  If you do not have HIV, but are at risk, it may be  recommended that you take a prescription medicine daily to prevent HIV infection. This is called pre-exposure prophylaxis (PrEP). You are considered at risk if:  You are sexually active and do not regularly use condoms or know the HIV status of your partner(s).  You take drugs by injection.  You are sexually active with a partner who has HIV. Talk with your health care provider about whether you are at high risk of  being infected with HIV. If you choose to begin PrEP, you should first be tested for HIV. You should then be tested every 3 months for as long as you are taking PrEP.  PREGNANCY   If you are premenopausal and you may become pregnant, ask your health care provider about preconception counseling.  If you may become pregnant, take 400 to 800 micrograms (mcg) of folic acid every day.  If you want to prevent pregnancy, talk to your health care provider about birth control (contraception). OSTEOPOROSIS AND MENOPAUSE   Osteoporosis is a disease in which the bones lose minerals and strength with aging. This can result in serious bone fractures. Your risk for osteoporosis can be identified using a bone density scan.  If you are 73 years of age or older, or if you are at risk for osteoporosis and fractures, ask your health care provider if you should be screened.  Ask your health care provider whether you should take a calcium or vitamin D supplement to lower your risk for osteoporosis.  Menopause may have certain physical symptoms and risks.  Hormone replacement therapy may reduce some of these symptoms and risks. Talk to your health care provider about whether hormone replacement therapy is right for you.  HOME CARE INSTRUCTIONS   Schedule regular health, dental, and eye exams.  Stay current with your immunizations.   Do not use any tobacco products including cigarettes, chewing tobacco, or electronic cigarettes.  If you are pregnant, do not drink alcohol.  If you are  breastfeeding, limit how much and how often you drink alcohol.  Limit alcohol intake to no more than 1 drink per day for nonpregnant women. One drink equals 12 ounces of beer, 5 ounces of wine, or 1 ounces of hard liquor.  Do not use street drugs.  Do not share needles.  Ask your health care provider for help if you need support or information about quitting drugs.  Tell your health care provider if you often feel depressed.  Tell your health care provider if you have ever been abused or do not feel safe at home.   This information is not intended to replace advice given to you by your health care provider. Make sure you discuss any questions you have with your health care provider.   Document Released: 04/16/2011 Document Revised: 10/22/2014 Document Reviewed: 09/02/2013 Elsevier Interactive Patient Education [employer].

## 2015-10-12 NOTE — Progress Notes (Signed)
  I have reviewed the above information and agree with above.   Nylan Nevel, MD 

## 2015-11-01 ENCOUNTER — Other Ambulatory Visit (INDEPENDENT_AMBULATORY_CARE_PROVIDER_SITE_OTHER): Payer: PPO

## 2015-11-01 DIAGNOSIS — Z1159 Encounter for screening for other viral diseases: Secondary | ICD-10-CM | POA: Diagnosis not present

## 2015-11-01 DIAGNOSIS — E785 Hyperlipidemia, unspecified: Secondary | ICD-10-CM | POA: Diagnosis not present

## 2015-11-01 DIAGNOSIS — Z0000 Encounter for general adult medical examination without abnormal findings: Secondary | ICD-10-CM | POA: Diagnosis not present

## 2015-11-01 LAB — LIPID PANEL
CHOL/HDL RATIO: 3
CHOLESTEROL: 187 mg/dL (ref 0–200)
HDL: 57.2 mg/dL (ref >39.00)
LDL CALC: 118 mg/dL — AB (ref 0–99)
NonHDL: 129.93
Triglycerides: 60 mg/dL (ref 0.0–149.0)
VLDL: 12 mg/dL (ref 0.0–40.0)

## 2015-11-01 LAB — CBC WITH DIFFERENTIAL/PLATELET
BASOS PCT: 0.4 % (ref 0.0–3.0)
Basophils Absolute: 0 10*3/uL (ref 0.0–0.1)
EOS ABS: 0.1 10*3/uL (ref 0.0–0.7)
Eosinophils Relative: 1.3 % (ref 0.0–5.0)
HCT: 36.7 % (ref 36.0–46.0)
Hemoglobin: 12.2 g/dL (ref 12.0–15.0)
Lymphocytes Relative: 24 % (ref 12.0–46.0)
Lymphs Abs: 1.5 10*3/uL (ref 0.7–4.0)
MCHC: 33.3 g/dL (ref 30.0–36.0)
MCV: 92.2 fl (ref 78.0–100.0)
MONO ABS: 0.7 10*3/uL (ref 0.1–1.0)
Monocytes Relative: 12.1 % — ABNORMAL HIGH (ref 3.0–12.0)
NEUTROS ABS: 3.8 10*3/uL (ref 1.4–7.7)
Neutrophils Relative %: 62.2 % (ref 43.0–77.0)
PLATELETS: 192 10*3/uL (ref 150.0–400.0)
RBC: 3.98 Mil/uL (ref 3.87–5.11)
RDW: 12.9 % (ref 11.5–15.5)
WBC: 6.1 10*3/uL (ref 4.0–10.5)

## 2015-11-01 LAB — COMPREHENSIVE METABOLIC PANEL
ALK PHOS: 81 U/L (ref 39–117)
ALT: 52 U/L — ABNORMAL HIGH (ref 0–35)
AST: 46 U/L — ABNORMAL HIGH (ref 0–37)
Albumin: 4.1 g/dL (ref 3.5–5.2)
BILIRUBIN TOTAL: 0.6 mg/dL (ref 0.2–1.2)
BUN: 14 mg/dL (ref 6–23)
CO2: 30 mEq/L (ref 19–32)
Calcium: 9 mg/dL (ref 8.4–10.5)
Chloride: 104 mEq/L (ref 96–112)
Creatinine, Ser: 0.64 mg/dL (ref 0.40–1.20)
GFR: 97.79 mL/min (ref >60.00)
GLUCOSE: 92 mg/dL (ref 70–99)
POTASSIUM: 4.3 meq/L (ref 3.5–5.1)
SODIUM: 140 meq/L (ref 135–145)
Total Protein: 7.3 g/dL (ref 6.0–8.3)

## 2015-11-01 LAB — TSH: TSH: 1 u[IU]/mL (ref 0.35–4.50)

## 2015-11-01 LAB — LDL CHOLESTEROL, DIRECT: Direct LDL: 121 mg/dL

## 2015-11-02 LAB — HEPATITIS C ANTIBODY: HCV AB: NEGATIVE

## 2015-11-07 ENCOUNTER — Telehealth: Payer: Self-pay | Admitting: Internal Medicine

## 2015-11-07 NOTE — Telephone Encounter (Signed)
Pt was returning [place] call. Please call pt @ [phone]. Thank you!

## 2015-11-07 NOTE — Telephone Encounter (Signed)
Called concerning lab results.

## 2015-11-09 ENCOUNTER — Encounter: Payer: Self-pay | Admitting: Internal Medicine

## 2015-12-08 ENCOUNTER — Encounter: Payer: Self-pay | Admitting: Internal Medicine

## 2015-12-08 ENCOUNTER — Ambulatory Visit (INDEPENDENT_AMBULATORY_CARE_PROVIDER_SITE_OTHER): Payer: PPO | Admitting: Internal Medicine

## 2015-12-08 VITALS — BP 122/78 | HR 87 | Temp 98.0°F | Resp 12 | Ht 64.75 in | Wt 159.5 lb

## 2015-12-08 DIAGNOSIS — F409 Phobic anxiety disorder, unspecified: Secondary | ICD-10-CM | POA: Diagnosis not present

## 2015-12-08 DIAGNOSIS — Z0000 Encounter for general adult medical examination without abnormal findings: Secondary | ICD-10-CM

## 2015-12-08 DIAGNOSIS — Z1239 Encounter for other screening for malignant neoplasm of breast: Secondary | ICD-10-CM

## 2015-12-08 DIAGNOSIS — R748 Abnormal levels of other serum enzymes: Secondary | ICD-10-CM

## 2015-12-08 DIAGNOSIS — Z23 Encounter for immunization: Secondary | ICD-10-CM | POA: Diagnosis not present

## 2015-12-08 DIAGNOSIS — F5105 Insomnia due to other mental disorder: Secondary | ICD-10-CM

## 2015-12-08 LAB — HEPATIC FUNCTION PANEL
ALT: 44 U/L — ABNORMAL HIGH (ref 0–35)
AST: 33 U/L (ref 0–37)
Albumin: 4.4 g/dL (ref 3.5–5.2)
Alkaline Phosphatase: 76 U/L (ref 39–117)
BILIRUBIN DIRECT: 0.1 mg/dL (ref 0.0–0.3)
BILIRUBIN TOTAL: 0.6 mg/dL (ref 0.2–1.2)
Total Protein: 7.5 g/dL (ref 6.0–8.3)

## 2015-12-08 LAB — CK: Total CK: 62 U/L (ref 7–177)

## 2015-12-08 MED ORDER — TRAZODONE HCL 50 MG PO TABS: 25.0000 mg | ORAL_TABLET | Freq: Every evening | ORAL | Status: DC | PRN

## 2015-12-08 NOTE — Patient Instructions (Signed)
I have ordered your 3D mammogram  Your next PAP smear is due in 2018  Your next colonoscopy is due April 2018 (Dr Vira Agar,  [facility])  You are due to see Dr Rockey Situ in May for repeat ECHO .  please call their office to schedule if it has not been done   You received the Pneumovax vaccine   If your liver enzymes are still elevated,  i will need to order additional blood tests and an  Ultrasound of your liver  Menopause is a normal process in which your reproductive ability comes to an end. This process happens gradually over a span of months to years, usually between the ages of 31 and 51. Menopause is complete when you have missed 12 consecutive menstrual periods. It is important to talk with your health care provider about some of the most common conditions that affect postmenopausal women, such as heart disease, cancer, and bone loss (osteoporosis). Adopting a healthy lifestyle and getting preventive care can help to promote your health and wellness. Those actions can also lower your chances of developing some of these common conditions. WHAT SHOULD I KNOW ABOUT MENOPAUSE? During menopause, you may experience a number of symptoms, such as:  Moderate-to-severe hot flashes.  Night sweats.  Decrease in sex drive.  Mood swings.  Headaches.  Tiredness.  Irritability.  Memory problems.  Insomnia. Choosing to treat or not to treat menopausal changes is an individual decision that you make with your health care provider. WHAT SHOULD I KNOW ABOUT HORMONE REPLACEMENT THERAPY AND SUPPLEMENTS? Hormone therapy products are effective for treating symptoms that are associated with menopause, such as hot flashes and night sweats. Hormone replacement carries certain risks, especially as you become older. If you are thinking about using estrogen or estrogen with progestin treatments, discuss the benefits and risks with your health care provider. WHAT SHOULD I KNOW ABOUT HEART DISEASE AND  STROKE? Heart disease, heart attack, and stroke become more likely as you age. This may be due, in part, to the hormonal changes that your body experiences during menopause. These can affect how your body processes dietary fats, triglycerides, and cholesterol. Heart attack and stroke are both medical emergencies. There are many things that you can do to help prevent heart disease and stroke:  Have your blood pressure checked at least every 1-2 years. High blood pressure causes heart disease and increases the risk of stroke.  If you are 83-13 years old, ask your health care provider if you should take aspirin to prevent a heart attack or a stroke.  Do not use any tobacco products, including cigarettes, chewing tobacco, or electronic cigarettes. If you need help quitting, ask your health care provider.  It is important to eat a healthy diet and maintain a healthy weight.  Be sure to include plenty of vegetables, fruits, low-fat dairy products, and lean protein.  Avoid eating foods that are high in solid fats, added sugars, or salt (sodium).  Get regular exercise. This is one of the most important things that you can do for your health.  Try to exercise for at least 150 minutes each week. The type of exercise that you do should increase your heart rate and make you sweat. This is known as moderate-intensity exercise.  Try to do strengthening exercises at least twice each week. Do these in addition to the moderate-intensity exercise.  Know your numbers.Ask your health care provider to check your cholesterol and your blood glucose. Continue to have your blood tested  as directed by your health care provider. WHAT SHOULD I KNOW ABOUT CANCER SCREENING? There are several types of cancer. Take the following steps to reduce your risk and to catch any cancer development as early as possible. Breast Cancer  Practice breast self-awareness.  This means understanding how your breasts normally appear  and feel.  It also means doing regular breast self-exams. Let your health care provider know about any changes, no matter how small.  If you are 40 or older, have a clinician do a breast exam (clinical breast exam or CBE) every year. Depending on your age, family history, and medical history, it may be recommended that you also have a yearly breast X-ray (mammogram).  If you have a family history of breast cancer, talk with your health care provider about genetic screening.  If you are at high risk for breast cancer, talk with your health care provider about having an MRI and a mammogram every year.  Breast cancer (BRCA) gene test is recommended for women who have family members with BRCA-related cancers. Results of the assessment will determine the need for genetic counseling and BRCA1 and for BRCA2 testing. BRCA-related cancers include these types:  Breast. This occurs in males or females.  Ovarian.  Tubal. This may also be called fallopian tube cancer.  Cancer of the abdominal or pelvic lining (peritoneal cancer).  Prostate.  Pancreatic. Cervical, Uterine, and Ovarian Cancer Your health care provider may recommend that you be screened regularly for cancer of the pelvic organs. These include your ovaries, uterus, and vagina. This screening involves a pelvic exam, which includes checking for microscopic changes to the surface of your cervix (Pap test).  For women ages 21-65, health care providers may recommend a pelvic exam and a Pap test every three years. For women ages 37-65, they may recommend the Pap test and pelvic exam, combined with testing for human papilloma virus (HPV), every five years. Some types of HPV increase your risk of cervical cancer. Testing for HPV may also be done on women of any age who have unclear Pap test results.  Other health care providers may not recommend any screening for nonpregnant women who are considered low risk for pelvic cancer and have no  symptoms. Ask your health care provider if a screening pelvic exam is right for you.  If you have had past treatment for cervical cancer or a condition that could lead to cancer, you need Pap tests and screening for cancer for at least 20 years after your treatment. If Pap tests have been discontinued for you, your risk factors (such as having a new sexual partner) need to be reassessed to determine if you should start having screenings again. Some women have medical problems that increase the chance of getting cervical cancer. In these cases, your health care provider may recommend that you have screening and Pap tests more often.  If you have a family history of uterine cancer or ovarian cancer, talk with your health care provider about genetic screening.  If you have vaginal bleeding after reaching menopause, tell your health care provider.  There are currently no reliable tests available to screen for ovarian cancer. Lung Cancer Lung cancer screening is recommended for adults 24-38 years old who are at high risk for lung cancer because of a history of smoking. A yearly low-dose CT scan of the lungs is recommended if you:  Currently smoke.  Have a history of at least 30 pack-years of smoking and you currently smoke or  have quit within the past 15 years. A pack-year is smoking an average of one pack of cigarettes per day for one year. Yearly screening should:  Continue until it has been 15 years since you quit.  Stop if you develop a health problem that would prevent you from having lung cancer treatment. Colorectal Cancer  This type of cancer can be detected and can often be prevented.  Routine colorectal cancer screening usually begins at age 20 and continues through age 72.  If you have risk factors for colon cancer, your health care provider may recommend that you be screened at an earlier age.  If you have a family history of colorectal cancer, talk with your health care provider  about genetic screening.  Your health care provider may also recommend using home test kits to check for hidden blood in your stool.  A small camera at the end of a tube can be used to examine your colon directly (sigmoidoscopy or colonoscopy). This is done to check for the earliest forms of colorectal cancer.  Direct examination of the colon should be repeated every 5-10 years until age 66. However, if early forms of precancerous polyps or small growths are found or if you have a family history or genetic risk for colorectal cancer, you may need to be screened more often. Skin Cancer  Check your skin from head to toe regularly.  Monitor any moles. Be sure to tell your health care provider:  About any new moles or changes in moles, especially if there is a change in a mole's shape or color.  If you have a mole that is larger than the size of a pencil eraser.  If any of your family members has a history of skin cancer, especially at a young age, talk with your health care provider about genetic screening.  Always use sunscreen. Apply sunscreen liberally and repeatedly throughout the day.  Whenever you are outside, protect yourself by wearing long sleeves, pants, a wide-brimmed hat, and sunglasses. WHAT SHOULD I KNOW ABOUT OSTEOPOROSIS? Osteoporosis is a condition in which bone destruction happens more quickly than new bone creation. After menopause, you may be at an increased risk for osteoporosis. To help prevent osteoporosis or the bone fractures that can happen because of osteoporosis, the following is recommended:  If you are 39-22 years old, get at least 1,000 mg of calcium and at least 600 mg of vitamin D per day.  If you are older than age 67 but younger than age 58, get at least 1,200 mg of calcium and at least 600 mg of vitamin D per day.  If you are older than age 21, get at least 1,200 mg of calcium and at least 800 mg of vitamin D per day. Smoking and excessive alcohol intake  increase the risk of osteoporosis. Eat foods that are rich in calcium and vitamin D, and do weight-bearing exercises several times each week as directed by your health care provider. WHAT SHOULD I KNOW ABOUT HOW MENOPAUSE AFFECTS [place]? Depression may occur at any age, but it is more common as you become older. Common symptoms of depression include:  Low or sad mood.  Changes in sleep patterns.  Changes in appetite or eating patterns.  Feeling an overall lack of motivation or enjoyment of activities that you previously enjoyed.  Frequent crying spells. Talk with your health care provider if you think that you are experiencing depression. WHAT SHOULD I KNOW ABOUT IMMUNIZATIONS? It is important that  you get and maintain your immunizations. These include:  Tetanus, diphtheria, and pertussis (Tdap) booster vaccine.  Influenza every year before the flu season begins.  Pneumonia vaccine.  Shingles vaccine. Your health care provider may also recommend other immunizations.   This information is not intended to replace advice given to you by your health care provider. Make sure you discuss any questions you have with your health care provider.   Document Released: 11/23/2005 Document Revised: 10/22/2014 Document Reviewed: 06/03/2014 Elsevier Interactive Patient Education [employer].

## 2015-12-08 NOTE — Progress Notes (Signed)
Patient ID: Sally Morgan, female    DOB: 02/10/1946  Age: 70 y.o. MRN: [id number]  The patient is here for annual  wellness examination and management of other chronic and acute problems.  Colonoscopy  due 2018 Jan fasting labs noted liver enzyme elevation, fasting lipids normal     The risk factors are reflected in the social history.  The roster of all physicians providing medical care to patient - is listed in the Snapshot section of the chart.  Activities of daily living:  The patient is 100% independent in all ADLs: dressing, toileting, feeding as well as independent mobility  Home safety : The patient has smoke detectors in the home. They wear seatbelts.  There are no firearms at home. There is no violence in the home.   There is no risks for hepatitis, STDs or HIV. There is no   history of blood transfusion. They have no travel history to infectious disease endemic areas of the world.  The patient has seen their dentist in the last six month. They have seen their eye doctor in the last year. They admit to slight hearing difficulty with regard to whispered voices and some television programs.  They have deferred audiologic testing in the last year.  They do not  have excessive sun exposure. Discussed the need for sun protection: hats, long sleeves and use of sunscreen if there is significant sun exposure.   Diet: the importance of a healthy diet is discussed. They do have a healthy diet.  The benefits of regular aerobic exercise were discussed. She walks 4 times per week ,  20 minutes.   Depression screen: there are no signs or vegative symptoms of depression- irritability, change in appetite, anhedonia, sadness/tearfullness.  Cognitive assessment: the patient manages all their financial and personal affairs and is actively engaged. They could relate day,date,year and events; recalled 2/3 objects at 3 minutes; performed clock-face test normally.  The following portions of the  patient's history were reviewed and updated as appropriate: allergies, current medications, past family history, past medical history,  past surgical history, past social history  and problem list.  Visual acuity was not assessed per patient preference since she has regular follow up with her ophthalmologist. Hearing and body mass index were assessed and reviewed.   During the course of the visit the patient was educated and counseled about appropriate screening and preventive services including : fall prevention , diabetes screening, nutrition counseling, colorectal cancer screening, and recommended immunizations.    CC: The primary encounter diagnosis was Insomnia due to anxiety and fear. Diagnoses of Elevated liver enzymes, Screening for breast cancer, Need for prophylactic vaccination against Streptococcus pneumoniae (pneumococcus), and Encounter for preventive health examination were also pertinent to this visit.  Chronic frequent wakening,  Has tried all natural remedies  Has tried increasing exercise to 3 times a week at the gym and it helps No bladder interruptions .  Drinks 3 16 ouncre water with meals,  Herbal tea decaffeinated after morning coffee No snoring ,  Just a light sleeper .  Falls asleep easily most nights.   Last seen June 2015   Sally Morgan May 2016 for cardiomyoapthy, dilated aortic root with placque  Had a vein ablation, complicated by post procedure /DVT which progressed on ASA and was given Xarelto x 2 weeks Tried Red yeast rice but stopped   History Sally Morgan has a past medical history of Arthritis; Diverticulitis, colon; and GERD (gastroesophageal reflux disease).   She has past  surgical history that includes Cesarean section.   Her family history includes Appendicitis in her father; Cancer (age of onset: 63) in her brother; Early death in her father and mother; Tuberculosis in her mother.She reports that she has never smoked. She does not have any smokeless  tobacco history on file. She reports that she drinks about 2.4 oz of alcohol per week. She reports that she does not use illicit drugs.  Outpatient Prescriptions Prior to Visit  Medication Sig Dispense Refill  . aspirin 81 MG tablet Take 81 mg by mouth daily.    Marland Kitchen losartan (COZAAR) 25 MG tablet Take 1 tablet (25 mg total) by mouth daily. 90 tablet 3   No facility-administered medications prior to visit.    Review of Systems   Patient denies headache, fevers, malaise, unintentional weight loss, skin rash, eye pain, sinus congestion and sinus pain, sore throat, dysphagia,  hemoptysis , cough, dyspnea, wheezing, chest pain, palpitations, orthopnea, edema, abdominal pain, nausea, melena, diarrhea, constipation, flank pain, dysuria, hematuria, urinary  Frequency, nocturia, numbness, tingling, seizures,  Focal weakness, Loss of consciousness,  Tremor, insomnia, depression, anxiety, and suicidal ideation.     Objective:  BP 122/78 mmHg  Pulse 87  Temp(Src) 98 F (36.7 C) (Oral)  Resp 12  Ht 5' 4.75" (1.645 m)  Wt 159 lb 8 oz (72.349 kg)  BMI 26.74 kg/m2  SpO2 98%  Physical Exam   General appearance: alert, cooperative and appears stated age Head: Normocephalic, without obvious abnormality, atraumatic Eyes: conjunctivae/corneas clear. PERRL, EOM's intact. Fundi benign. Ears: normal TM's and external ear canals both ears Nose: Nares normal. Septum midline. Mucosa normal. No drainage or sinus tenderness. Throat: lips, mucosa, and tongue normal; teeth and gums normal Neck: no adenopathy, no carotid bruit, no JVD, supple, symmetrical, trachea midline and thyroid not enlarged, symmetric, no tenderness/mass/nodules Lungs: clear to auscultation bilaterally Breasts: normal appearance, no masses or tenderness Heart: regular rate and rhythm, S1, S2 normal, no murmur, click, rub or gallop Abdomen: soft, non-tender; bowel sounds normal; no masses,  no organomegaly Extremities: extremities normal,  atraumatic, no cyanosis or edema Pulses: 2+ and symmetric Skin: Skin color, texture, turgor normal. No rashes or lesions Neurologic: Alert and oriented X 3, normal strength and tone. Normal symmetric reflexes. Normal coordination and gait.     Assessment & Plan:   Problem List Items Addressed This Visit    Encounter for preventive health examination    Annual comprehensive preventive exam was done as well as an evaluation and management of chronic conditions .  During the course of the visit the patient was educated and counseled about appropriate screening and preventive services including :  diabetes screening, lipid analysis with projected  10 year  risk for CAD , nutrition counseling, breast, cervical and colorectal cancer screening, and recommended immunizations.  Printed recommendations for health maintenance screenings was give      Elevated liver enzymes    Persistent. She soul retrun for wrokup to rule  Out autoimmune forms of arthritis  Lab Results  Component Value Date   ALT 44* 12/08/2015   AST 33 12/08/2015   ALKPHOS 76 12/08/2015   BILITOT 0.6 12/08/2015         Relevant Orders   Hepatic function panel (Completed)   CK (Completed)   Insomnia due to anxiety and fear - Primary    Other Visit Diagnoses    Screening for breast cancer        Relevant Orders    MM DIGITAL  SCREENING BILATERAL    Need for prophylactic vaccination against Streptococcus pneumoniae (pneumococcus)        Relevant Orders    Pneumococcal polysaccharide vaccine 23-valent greater than or equal to 2yo subcutaneous/IM (Completed)       I am having Ms. Rama start on traZODone. I am also having her maintain her aspirin and losartan.  Meds ordered this encounter  Medications  . traZODone (DESYREL) 50 MG tablet    Sig: Take 0.5-1 tablets (25-50 mg total) by mouth at bedtime as needed for sleep.    Dispense:  30 tablet    Refill:  3    There are no discontinued medications.  Follow-up:  Return in about 6 months (around 06/06/2016) for hypertension follow up .   Crecencio Mc, MD

## 2015-12-08 NOTE — Progress Notes (Signed)
Pre-visit discussion using our clinic review tool. No additional management support is needed unless otherwise documented below in the visit note.  

## 2015-12-09 ENCOUNTER — Telehealth: Payer: Self-pay | Admitting: Internal Medicine

## 2015-12-09 ENCOUNTER — Other Ambulatory Visit: Payer: Self-pay | Admitting: Internal Medicine

## 2015-12-09 DIAGNOSIS — R748 Abnormal levels of other serum enzymes: Secondary | ICD-10-CM

## 2015-12-09 NOTE — Telephone Encounter (Signed)
please tell her to use 600 mg ibuprofen every 8 hours and 500 mg tylenol every 8 hours until arm starts to feel better. She can also ice the arm.  This is not an allergic reaction,  It has een reported by several other patients.

## 2015-12-09 NOTE — Telephone Encounter (Signed)
Pt called about having a Pneumonia shot and now she's having an allergic reaction. Pt is having chills/fever was 102/weak to where she could not walk by herself/left arm where she got the injection you cannot lift it all of this was last night. Pt states she feels better today. Call pt @ [phone]. Thank you!

## 2015-12-09 NOTE — Telephone Encounter (Signed)
Spoke with patient and she verbalized understanding she will try the suggested treatments.

## 2015-12-09 NOTE — Telephone Encounter (Signed)
See additional note. 

## 2015-12-09 NOTE — Telephone Encounter (Signed)
Pt spoke to [place] today. Pt wanted to call  back to let someone know that there is swelling and redness in her arm from the pneumonia shot from yesterday. Please advise pt. Phone number [phone].

## 2015-12-09 NOTE — Telephone Encounter (Signed)
Spoke with the patient.  States she has spent the past 18 hours in bed.  Got home from the doctors and didn't feel well, layed down and ended up with chills, took temp and it was 102.  Took ibuprofen and it broke. Patient states arm was so sore she couldn't move it at all, felt heavy.  This morning, no fever, I asked about the injection site, no reddness, no pain, arm just feels very heavy still can move more then yesterday.  She did take a aspirin this am for a headache seen she has been in bed.   Please advise.  She received the Pneumo-23 yesterday at an office visit.

## 2015-12-10 DIAGNOSIS — R748 Abnormal levels of other serum enzymes: Secondary | ICD-10-CM | POA: Insufficient documentation

## 2015-12-10 NOTE — Assessment & Plan Note (Signed)
Annual comprehensive preventive exam was done as well as an evaluation and management of chronic conditions .  During the course of the visit the patient was educated and counseled about appropriate screening and preventive services including :  diabetes screening, lipid analysis with projected  10 year  risk for CAD , nutrition counseling, breast, cervical and colorectal cancer screening, and recommended immunizations.  Printed recommendations for health maintenance screenings was give 

## 2015-12-10 NOTE — Assessment & Plan Note (Signed)
Persistent. She soul retrun for wrokup to rule  Out autoimmune forms of arthritis  Lab Results  Component Value Date   ALT 44* 12/08/2015   AST 33 12/08/2015   ALKPHOS 76 12/08/2015   BILITOT 0.6 12/08/2015

## 2015-12-12 ENCOUNTER — Other Ambulatory Visit (INDEPENDENT_AMBULATORY_CARE_PROVIDER_SITE_OTHER): Payer: PPO

## 2015-12-12 DIAGNOSIS — R748 Abnormal levels of other serum enzymes: Secondary | ICD-10-CM

## 2015-12-12 LAB — FERRITIN: Ferritin: 135 ng/mL (ref 10.0–291.0)

## 2015-12-13 LAB — IRON AND TIBC
%SAT: 26 % (ref 11–50)
Iron: 85 ug/dL (ref 45–160)
TIBC: 332 ug/dL (ref 250–450)
UIBC: 247 ug/dL (ref 125–400)

## 2015-12-13 LAB — HEPATITIS C ANTIBODY: HCV Ab: NEGATIVE

## 2015-12-13 LAB — HEPATITIS B SURFACE ANTIBODY,QUALITATIVE: HEP B S AB: NEGATIVE

## 2015-12-13 LAB — HEPATITIS B CORE ANTIBODY, TOTAL: Hep B Core Total Ab: NONREACTIVE

## 2015-12-13 LAB — HEPATITIS B SURFACE ANTIGEN: HEP B S AG: NEGATIVE

## 2015-12-13 LAB — ANA: ANA: NEGATIVE

## 2015-12-13 LAB — ANTI-SMITH ANTIBODY: ENA SM Ab Ser-aCnc: <1

## 2015-12-23 ENCOUNTER — Ambulatory Visit
Admission: RE | Admit: 2015-12-23 | Discharge: 2015-12-23 | Disposition: A | Discharge status: Home / Self Care | Payer: PPO | Source: Ambulatory Visit | Attending: Internal Medicine | Admitting: Internal Medicine

## 2015-12-23 DIAGNOSIS — R748 Abnormal levels of other serum enzymes: Secondary | ICD-10-CM | POA: Diagnosis not present

## 2015-12-26 ENCOUNTER — Telehealth: Payer: Self-pay | Admitting: Internal Medicine

## 2015-12-26 NOTE — Telephone Encounter (Signed)
Pt husband called to follow up on his wife [place] results that she got done on 12/23/2015. Call husband @ [phone]. Thank you!

## 2015-12-26 NOTE — Telephone Encounter (Signed)
Called and gave results to patient via phone.  Thanks

## 2016-01-19 ENCOUNTER — Other Ambulatory Visit: Payer: Self-pay

## 2016-01-19 ENCOUNTER — Ambulatory Visit (INDEPENDENT_AMBULATORY_CARE_PROVIDER_SITE_OTHER): Payer: PPO

## 2016-01-19 DIAGNOSIS — R000 Tachycardia, unspecified: Secondary | ICD-10-CM | POA: Diagnosis not present

## 2016-01-19 DIAGNOSIS — I420 Dilated cardiomyopathy: Secondary | ICD-10-CM

## 2016-01-19 DIAGNOSIS — I700 Atherosclerosis of aorta: Secondary | ICD-10-CM

## 2016-02-23 ENCOUNTER — Telehealth: Payer: Self-pay | Admitting: Internal Medicine

## 2016-02-23 NOTE — Telephone Encounter (Signed)
Can she come at 10am tomorrow she has an opening?

## 2016-02-23 NOTE — Telephone Encounter (Signed)
Patient requesting an appointment with Dr Derrel Nip for uncomfortable feeling when urinating and urgency. She does not want to see anyone else.

## 2016-02-23 NOTE — Telephone Encounter (Signed)
I called and informed the patient of the appointment with Dr. Derrel Nip on 5.12.17 @ 10:00. She agreed to be at the appointment.

## 2016-02-23 NOTE — Telephone Encounter (Signed)
thanks

## 2016-02-24 ENCOUNTER — Ambulatory Visit (INDEPENDENT_AMBULATORY_CARE_PROVIDER_SITE_OTHER): Payer: PPO | Admitting: Internal Medicine

## 2016-02-24 ENCOUNTER — Encounter: Payer: Self-pay | Admitting: Internal Medicine

## 2016-02-24 VITALS — BP 140/84 | HR 74 | Temp 97.5°F | Ht 64.75 in | Wt 159.2 lb

## 2016-02-24 DIAGNOSIS — R300 Dysuria: Secondary | ICD-10-CM

## 2016-02-24 DIAGNOSIS — N3000 Acute cystitis without hematuria: Secondary | ICD-10-CM | POA: Diagnosis not present

## 2016-02-24 LAB — POC URINALSYSI DIPSTICK (AUTOMATED)
SPEC GRAV UA: 1.015
UROBILINOGEN UA: NEGATIVE
pH, UA: 6.5

## 2016-02-24 MED ORDER — CIPROFLOXACIN HCL 250 MG PO TABS: 250.0000 mg | ORAL_TABLET | Freq: Two times a day (BID) | ORAL | Status: DC

## 2016-02-24 MED ORDER — HYDROCORTISONE 2.5 % RE CREA: 1.0000 "application " | TOPICAL_CREAM | Freq: Two times a day (BID) | RECTAL | Status: DC

## 2016-02-24 NOTE — Patient Instructions (Signed)
I am treating  you for a  Urinary tract infection  With ciprofloxacin.  please take it twice daily for 5 days   Please take a probiotic ( Align, Flora que or [employer]) for 2 weeks if you start the antibiotic to prevent a serious antibiotic associated diarrhea  Called" clostridium difficile colitis" ( should also help prevent   vaginal yeast infection)   You should consider using a daily stool softener to prevent constipation induced hemorrhoidal  Bleeding  I have sent a suppository to your pharmacy to use as needed for hemorrhoid bleeding

## 2016-02-24 NOTE — Progress Notes (Signed)
Subjective:  Patient ID: Sally Morgan, female    DOB: 11/20/1945  Age: 70 y.o. MRN: [id number]  CC: The primary encounter diagnosis was Dysuria. A diagnosis of Acute cystitis without hematuria was also pertinent to this visit.  HPI Sally Morgan presents for evaluation  suprapubic pressure and frequency.  Sympotms started on week ago.  No recetn sex or swimming  Hemorrhoids bother  Her intermittently .  Start  bleeding if she does not have a BM in 2 days or if she stops eating a high fiber diet .  They will start bleeding .  Would like to have definitive treatment but afraid of procedures.    Outpatient Prescriptions Prior to Visit  Medication Sig Dispense Refill  . aspirin 81 MG tablet Take 81 mg by mouth daily.    Marland Kitchen losartan (COZAAR) 25 MG tablet Take 1 tablet (25 mg total) by mouth daily. 90 tablet 3  . traZODone (DESYREL) 50 MG tablet Take 0.5-1 tablets (25-50 mg total) by mouth at bedtime as needed for sleep. 30 tablet 3   No facility-administered medications prior to visit.    Review of Systems;  Patient denies headache, fevers, malaise, unintentional weight loss, skin rash, eye pain, sinus congestion and sinus pain, sore throat, dysphagia,  hemoptysis , cough, dyspnea, wheezing, chest pain, palpitations, orthopnea, edema, abdominal pain, nausea, melena, diarrhea, constipation, flank pain, dysuria, hematuria, urinary  Frequency, nocturia, numbness, tingling, seizures,  Focal weakness, Loss of consciousness,  Tremor, insomnia, depression, anxiety, and suicidal ideation.      Objective:  BP 140/84 mmHg  Pulse 74  Temp(Src) 97.5 F (36.4 C)  Ht 5' 4.75" (1.645 m)  Wt 159 lb 3.2 oz (72.213 kg)  BMI 26.69 kg/m2  BP Readings from Last 3 Encounters:  02/24/16 140/84  12/08/15 122/78  10/12/15 136/80    Wt Readings from Last 3 Encounters:  02/24/16 159 lb 3.2 oz (72.213 kg)  12/08/15 159 lb 8 oz (72.349 kg)  10/12/15 158 lb 12.8 oz (72.031 kg)    General  appearance: alert, cooperative and appears stated age dline and thyroid not enlarged, symmetric, no tenderness/mass/nodules Back: symmetric, no curvature. ROM normal. No CVA tenderness. Lungs: clear to auscultation bilaterally Heart: regular rate and rhythm, S1, S2 normal, no murmur, click, rub or gallop Abdomen: soft, non-tender; bowel sounds normal; no masses,  no organomegaly Pulses: 2+ and symmetric Skin: Skin color, texture, turgor normal. No rashes or lesions Lymph nodes: Cervical, supraclavicular, and axillary nodes normal.  No results found for: HGBA1C  Lab Results  Component Value Date   CREATININE 0.64 11/01/2015   CREATININE 0.6 03/31/2014   CREATININE 0.7 11/05/2013    Lab Results  Component Value Date   WBC 6.1 11/01/2015   HGB 12.2 11/01/2015   HCT 36.7 11/01/2015   PLT 192.0 11/01/2015   GLUCOSE 92 11/01/2015   CHOL 187 11/01/2015   TRIG 60.0 11/01/2015   HDL 57.20 11/01/2015   LDLDIRECT 121.0 11/01/2015   LDLCALC 118* 11/01/2015   ALT 44* 12/08/2015   AST 33 12/08/2015   NA 140 11/01/2015   K 4.3 11/01/2015   CL 104 11/01/2015   CREATININE 0.64 11/01/2015   BUN 14 11/01/2015   CO2 30 11/01/2015   TSH 1.00 11/01/2015   MICROALBUR 0.4 11/05/2013    US Abdomen Limited Ruq  12/23/2015  CLINICAL DATA:  Elevated liver enzymes. EXAM: US ABDOMEN LIMITED - RIGHT UPPER QUADRANT COMPARISON:  None. FINDINGS: Gallbladder: No gallstones or wall thickening visualized.  No sonographic Murphy sign noted by sonographer. Common bile duct: Diameter: 2.0 mm Liver: No focal lesion identified. Within normal limits in parenchymal echogenicity. IMPRESSION: Normal exam. Electronically Signed   By: Marcello Moores  Register   On: 12/23/2015 09:03    Assessment & Plan:   Problem List Items Addressed This Visit    UTI (urinary tract infection)    Empiric treatment with Ciprofloxacin.        Other Visit Diagnoses    Dysuria    -  Primary    Relevant Orders    POCT Urinalysis  Dipstick (Automated) (Completed)    Urinalysis, Routine w reflex microscopic (not at [facility])    Urine culture       I am having Ms. Kerman start on ciprofloxacin and hydrocortisone. I am also having her maintain her aspirin, losartan, and traZODone.  Meds ordered this encounter  Medications  . ciprofloxacin (CIPRO) 250 MG tablet    Sig: Take 1 tablet (250 mg total) by mouth 2 (two) times daily.    Dispense:  10 tablet    Refill:  0  . hydrocortisone (PROCTOZONE-HC) 2.5 % rectal cream    Sig: Place 1 application rectally 2 (two) times daily.    Dispense:  30 g    Refill:  0    There are no discontinued medications.  Follow-up: No Follow-up on file.   Crecencio Mc, MD

## 2016-02-26 DIAGNOSIS — N390 Urinary tract infection, site not specified: Secondary | ICD-10-CM | POA: Insufficient documentation

## 2016-02-26 NOTE — Assessment & Plan Note (Signed)
Empiric treatment with Ciprofloxacin.

## 2016-03-01 ENCOUNTER — Ambulatory Visit (INDEPENDENT_AMBULATORY_CARE_PROVIDER_SITE_OTHER): Payer: PPO | Admitting: Cardiovascular Disease

## 2016-03-01 ENCOUNTER — Encounter: Payer: Self-pay | Admitting: Cardiovascular Disease

## 2016-03-01 VITALS — BP 138/80 | HR 64 | Ht 64.5 in | Wt 159.0 lb

## 2016-03-01 DIAGNOSIS — I77810 Thoracic aortic ectasia: Secondary | ICD-10-CM

## 2016-03-01 DIAGNOSIS — E785 Hyperlipidemia, unspecified: Secondary | ICD-10-CM

## 2016-03-01 DIAGNOSIS — R000 Tachycardia, unspecified: Secondary | ICD-10-CM | POA: Diagnosis not present

## 2016-03-01 DIAGNOSIS — I420 Dilated cardiomyopathy: Secondary | ICD-10-CM | POA: Diagnosis not present

## 2016-03-01 DIAGNOSIS — I700 Atherosclerosis of aorta: Secondary | ICD-10-CM

## 2016-03-01 NOTE — Patient Instructions (Addendum)
You are doing well. No medication changes were made.  Repeat echo in  2 years for dilated aortic root  Please call us if you have new issues that need to be addressed before your next appt.  Your physician wants you to follow-up in: 12 months.  You will receive a reminder letter in the mail two months in advance. If you don't receive a letter, please call our office to schedule the follow-up appointment.

## 2016-03-01 NOTE — Assessment & Plan Note (Signed)
Long discussion concerning her aortic root. Appears stable over the past several years. Would consider repeat echocardiogram in 2 years time. We did spend time discussing her prior echocardiograms over the past several years We looked at her initial CT scan results. We have requested radiology that her images be uploaded to our system for review We also spent time discussing other imaging options including CT coronary calcium scoring. This would allow review of coronary calcifications as well as aortic root in the future

## 2016-03-01 NOTE — Assessment & Plan Note (Signed)
She denies any symptoms concerning for tachycardia No further testing or monitoring needed

## 2016-03-01 NOTE — Progress Notes (Signed)
Patient ID: Sally Morgan, female    DOB: November 12, 1945, 70 y.o.   MRN: [id number]  HPI Comments: Sally Morgan  is a pleasant 70 a-year-old woman who has a history of hyperlipidemia, previously lost her husband to a cardiac issue, who had vein ablation in on the left with post procedure DVT noted on ultrasound. She was initially started on aspirin though this was changed to xarelto twice a day after the thrombus extended. She was given this for 2 weeks and then anticoagulation was held . She presents for routine followup of her hyperlipidemia and dilated aortic root Baseline EKG with diffuse T-wave abnormality  prior echocardiogram showing mildly dilated aortic root  Previous echocardiogram  showing stable aortic root 4.1 cm in 2016 Most recent echocardiogram 3.5 cm in 2017 Prior CT scan in early 2015 showing cardiac dilation, dilated aortic root, mild aortic plaquing in the ascending and descending aorta, no PE Echocardiogram in 2015 showed aortic root 4.2 cm with ejection fraction 45-50%, mildly dilated left ventricle (possible stress cardiomyopathy)  In follow-up today she reports that she feels well with no complaints Reports her blood pressure is stable on her current medications She is otherwise active, weight is stable She prefers not to be on a cholesterol medication despite aortic plaquing, mild  EKG on today's visit shows normal sinus rhythm with rate 64 bpm, diffuse T-wave abnormality anterior precordial leads, inferior leads, no change from prior study  Allergies  Allergen Reactions  . Lisinopril Cough    cough    Current Outpatient Prescriptions on File Prior to Visit  Medication Sig Dispense Refill  . aspirin 81 MG tablet Take 81 mg by mouth daily.    . hydrocortisone (PROCTOZONE-HC) 2.5 % rectal cream Place 1 application rectally 2 (two) times daily. 30 g 0  . losartan (COZAAR) 25 MG tablet Take 1 tablet (25 mg total) by mouth daily. 90 tablet 3  . traZODone (DESYREL) 50 MG  tablet Take 0.5-1 tablets (25-50 mg total) by mouth at bedtime as needed for sleep. 30 tablet 3   No current facility-administered medications on file prior to visit.    Past Medical History  Diagnosis Date  . Arthritis   . Diverticulitis, colon   . GERD (gastroesophageal reflux disease)     Past Surgical History  Procedure Laterality Date  . Cesarean section      x 2     Social History  reports that she has never smoked. She does not have any smokeless tobacco history on file. She reports that she drinks about 2.4 oz of alcohol per week. She reports that she does not use illicit drugs.  Family History family history includes Appendicitis in her father; Cancer (age of onset: 72) in her brother; Early death in her father and mother; Tuberculosis in her mother.   Review of Systems  Constitutional: Negative.   Respiratory: Negative.   Cardiovascular: Negative.   Gastrointestinal: Negative.   Musculoskeletal: Negative.   Neurological: Negative.   Hematological: Negative.   Psychiatric/Behavioral: Negative.   All other systems reviewed and are negative.   BP 138/80 mmHg  Pulse 64  Ht 5' 4.5" (1.638 m)  Wt 159 lb (72.122 kg)  BMI 26.88 kg/m2  Physical Exam  Constitutional: She is oriented to person, place, and time. She appears well-developed and well-nourished.  HENT:  Head: Normocephalic.  Nose: Nose normal.  Mouth/Throat: Oropharynx is clear and moist.  Eyes: Conjunctivae are normal. Pupils are equal, round, and reactive to light.  Neck: Normal range of motion. Neck supple. No JVD present.  Cardiovascular: Normal rate, regular rhythm, S1 normal, S2 normal, normal heart sounds and intact distal pulses.  Exam reveals no gallop and no friction rub.   No murmur heard. Pulmonary/Chest: Effort normal and breath sounds normal. No respiratory distress. She has no wheezes. She has no rales. She exhibits no tenderness.  Abdominal: Soft. Bowel sounds are normal. She exhibits  no distension. There is no tenderness.  Musculoskeletal: Normal range of motion. She exhibits no edema or tenderness.  Lymphadenopathy:    She has no cervical adenopathy.  Neurological: She is alert and oriented to person, place, and time. Coordination normal.  Skin: Skin is warm and dry. No rash noted. No erythema.  Psychiatric: She has a normal mood and affect. Her behavior is normal. Judgment and thought content normal.    Assessment and Plan  Nursing note and vitals reviewed.

## 2016-03-01 NOTE — Assessment & Plan Note (Signed)
Mild aortic atherosclerosis by report We will review images when they become available She is not interested in a statin at this time

## 2016-03-01 NOTE — Assessment & Plan Note (Signed)
Previous ejection fraction 45-50% in 2015 Now normal ejection fraction greater than 55% on echocardiogram this year No medication changes

## 2016-03-01 NOTE — Assessment & Plan Note (Signed)
Lab Results  Component Value Date   CHOL 187 11/01/2015   HDL 57.20 11/01/2015   LDLCALC 118* 11/01/2015   TRIG 60.0 11/01/2015   Recommended aggressive diet modification, exercise She does not want a statin   Total encounter time more than 25 minutes  Greater than 50% was spent in counseling and coordination of care with the patient

## 2016-03-05 ENCOUNTER — Ambulatory Visit (INDEPENDENT_AMBULATORY_CARE_PROVIDER_SITE_OTHER): Payer: PPO | Admitting: Internal Medicine

## 2016-03-05 ENCOUNTER — Encounter: Payer: Self-pay | Admitting: Internal Medicine

## 2016-03-05 ENCOUNTER — Ambulatory Visit: Payer: Self-pay | Admitting: Internal Medicine

## 2016-03-05 ENCOUNTER — Telehealth: Payer: Self-pay | Admitting: Internal Medicine

## 2016-03-05 VITALS — BP 140/86 | HR 108 | Temp 98.1°F | Resp 12 | Ht 64.5 in | Wt 158.0 lb

## 2016-03-05 DIAGNOSIS — N390 Urinary tract infection, site not specified: Secondary | ICD-10-CM | POA: Diagnosis not present

## 2016-03-05 DIAGNOSIS — R300 Dysuria: Secondary | ICD-10-CM | POA: Diagnosis not present

## 2016-03-05 LAB — POCT URINALYSIS DIPSTICK
Bilirubin, UA: NEGATIVE
GLUCOSE UA: NEGATIVE
KETONES UA: NEGATIVE
Nitrite, UA: POSITIVE
SPEC GRAV UA: 1.025
UROBILINOGEN UA: 0.2
pH, UA: 6

## 2016-03-05 MED ORDER — SULFAMETHOXAZOLE-TRIMETHOPRIM 800-160 MG PO TABS: 1.0000 | ORAL_TABLET | Freq: Two times a day (BID) | ORAL | Status: DC

## 2016-03-05 MED ORDER — PROMETHAZINE HCL 12.5 MG PO TABS: 12.5000 mg | ORAL_TABLET | Freq: Three times a day (TID) | ORAL | Status: DC | PRN

## 2016-03-05 NOTE — Patient Instructions (Signed)
I am treating your recurrent cystitis with a different antibiotic   Septra DS (double strength) 1 tablet twice daily daily with food for 7 days .  Start tonight    Promethazine if needed for nausea   We will call you with the results of the culture when it is available.  (usually 3 days)

## 2016-03-05 NOTE — Telephone Encounter (Signed)
The patient finished her antibiotics for a UTI on 5.17.17 . She is still experiencing symptoms . Foul Urine smell and pain in lower abdomen and back.

## 2016-03-05 NOTE — Progress Notes (Signed)
Pre-visit discussion using our clinic review tool. No additional management support is needed unless otherwise documented below in the visit note.  

## 2016-03-05 NOTE — Telephone Encounter (Signed)
Patient schedule to come in to be evaluated for urinary issue.

## 2016-03-05 NOTE — Progress Notes (Signed)
Subjective:  Patient ID: Sally Morgan, female    DOB: 07/21/1946  Age: 70 y.o. MRN: [id number]  CC: The primary encounter diagnosis was Dysuria. A diagnosis of Recurrent UTI (urinary tract infection) was also pertinent to this visit.  HPI Sally Morgan presents for recurrent symptoms of UTI. Patient was treated on May 12 for symptoms of UTi and abnormal UA with empiric cipro  x 5 days, however the urine culture was not sent by staff in error.  Patient finished the medication and symptoms initially resolved but recurred one day after finishing the antibiotics, . On May 18th.  She reports suprapubic pain that over the weekend became quite troublesome and she notes urinary hesitancy but no retention.   She denies CVA pain , hematuria, ausea and fevers but has been having lower back pain.         Dipstick is positive for blood and leukocytes today .      Outpatient Prescriptions Prior to Visit  Medication Sig Dispense Refill  . aspirin 81 MG tablet Take 81 mg by mouth daily.    . hydrocortisone (PROCTOZONE-HC) 2.5 % rectal cream Place 1 application rectally 2 (two) times daily. 30 g 0  . losartan (COZAAR) 25 MG tablet Take 1 tablet (25 mg total) by mouth daily. 90 tablet 3  . traZODone (DESYREL) 50 MG tablet Take 0.5-1 tablets (25-50 mg total) by mouth at bedtime as needed for sleep. 30 tablet 3   No facility-administered medications prior to visit.    Review of Systems;  Patient denies headache, fevers, malaise, unintentional weight loss, skin rash, eye pain, sinus congestion and sinus pain, sore throat, dysphagia,  hemoptysis , cough, dyspnea, wheezing, chest pain, palpitations, orthopnea, edema, nausea, melena, diarrhea, constipation, flank pain, dysuria, hematuria, , nocturia, numbness, tingling, seizures,  Focal weakness, Loss of consciousness,  Tremor, insomnia, depression, anxiety, and suicidal ideation.      Objective:  BP 140/86 mmHg  Pulse 108  Temp(Src) 98.1 F (36.7 C)  (Oral)  Resp 12  Ht 5' 4.5" (1.638 m)  Wt 158 lb (71.668 kg)  BMI 26.71 kg/m2  SpO2 99%  BP Readings from Last 3 Encounters:  03/05/16 140/86  03/01/16 138/80  02/24/16 140/84    Wt Readings from Last 3 Encounters:  03/05/16 158 lb (71.668 kg)  03/01/16 159 lb (72.122 kg)  02/24/16 159 lb 3.2 oz (72.213 kg)    General appearance: alert, cooperative and appears stated age Back: symmetric, no curvature. ROM normal. No CVA tenderness. Lungs: clear to auscultation bilaterally Heart: regular rate and rhythm, S1, S2 normal, no murmur, click, rub or gallop Abdomen: soft, non-tender; bowel sounds normal; no masses,  no organomegaly Pulses: 2+ and symmetric Skin: Skin color, texture, turgor normal. No rashes or lesions Lymph nodes: Cervical, supraclavicular, and axillary nodes normal.  No results found for: HGBA1C  Lab Results  Component Value Date   CREATININE 0.64 11/01/2015   CREATININE 0.6 03/31/2014   CREATININE 0.7 11/05/2013    Lab Results  Component Value Date   WBC 6.1 11/01/2015   HGB 12.2 11/01/2015   HCT 36.7 11/01/2015   PLT 192.0 11/01/2015   GLUCOSE 92 11/01/2015   CHOL 187 11/01/2015   TRIG 60.0 11/01/2015   HDL 57.20 11/01/2015   LDLDIRECT 121.0 11/01/2015   LDLCALC 118* 11/01/2015   ALT 44* 12/08/2015   AST 33 12/08/2015   NA 140 11/01/2015   K 4.3 11/01/2015   CL 104 11/01/2015   CREATININE 0.64  11/01/2015   BUN 14 11/01/2015   CO2 30 11/01/2015   TSH 1.00 11/01/2015   MICROALBUR 0.4 11/05/2013     Assessment & Plan:   Problem List Items Addressed This Visit    Recurrent UTI (urinary tract infection)    Symptoms were initially resolved while taking cipr but recurred after one day.  Treating again empirically ,  With change in medication from cipro to Septra.  Formal UA and culture again ordered  And this time actuallly sent.        Relevant Medications   sulfamethoxazole-trimethoprim (BACTRIM DS,SEPTRA DS) 800-160 MG tablet    Other  Visit Diagnoses    Dysuria    -  Primary    Relevant Orders    POCT Urinalysis Dipstick (Completed)    Urine Culture    Urinalysis, Routine w reflex microscopic       I am having Sally Morgan start on sulfamethoxazole-trimethoprim and promethazine. I am also having her maintain her aspirin, losartan, traZODone, and hydrocortisone.  Meds ordered this encounter  Medications  . sulfamethoxazole-trimethoprim (BACTRIM DS,SEPTRA DS) 800-160 MG tablet    Sig: Take 1 tablet by mouth 2 (two) times daily.    Dispense:  14 tablet    Refill:  0  . promethazine (PHENERGAN) 12.5 MG tablet    Sig: Take 1 tablet (12.5 mg total) by mouth every 8 (eight) hours as needed for nausea or vomiting.    Dispense:  20 tablet    Refill:  0    There are no discontinued medications.  Follow-up: No Follow-up on file.   Crecencio Mc, MD

## 2016-03-06 LAB — URINALYSIS, ROUTINE W REFLEX MICROSCOPIC
BILIRUBIN URINE: NEGATIVE
Ketones, ur: NEGATIVE
NITRITE: POSITIVE — AB
Specific Gravity, Urine: 1.01 (ref 1.000–1.030)
TOTAL PROTEIN, URINE-UPE24: 100 — AB
Urine Glucose: NEGATIVE
Urobilinogen, UA: 0.2 (ref 0.0–1.0)
pH: 6 (ref 5.0–8.0)

## 2016-03-06 NOTE — Assessment & Plan Note (Signed)
Symptoms were initially resolved while taking cipr but recurred after one day.  Treating again empirically ,  With change in medication from cipro to Septra.  Formal UA and culture again ordered  And this time actuallly sent.

## 2016-03-07 ENCOUNTER — Telehealth: Payer: Self-pay | Admitting: Internal Medicine

## 2016-03-09 ENCOUNTER — Other Ambulatory Visit: Payer: Self-pay | Admitting: Internal Medicine

## 2016-03-09 ENCOUNTER — Telehealth: Payer: Self-pay | Admitting: Internal Medicine

## 2016-03-09 LAB — URINE CULTURE: Colony Count: >=100000

## 2016-03-09 MED ORDER — AMOXICILLIN-POT CLAVULANATE 875-125 MG PO TABS: 1.0000 | ORAL_TABLET | Freq: Two times a day (BID) | ORAL | Status: DC

## 2016-03-09 NOTE — Telephone Encounter (Signed)
Her E Coli UTI is resistant to CIpro AND septra. Both of which we have empirically prescribed.  So now she has to take Augmenitn for 7 days .. rx CALLED IN

## 2016-03-09 NOTE — Telephone Encounter (Signed)
Please advise for results, thanks

## 2016-03-09 NOTE — Telephone Encounter (Signed)
Pt called in to follow up on her urine results from 03/05/16.   Call pt @ [phone]. Thank you!

## 2016-03-09 NOTE — Telephone Encounter (Signed)
Spoke with the patient, she will pick up the prescription, thanks

## 2016-03-26 ENCOUNTER — Other Ambulatory Visit: Payer: Self-pay | Admitting: Cardiovascular Disease

## 2016-06-07 ENCOUNTER — Ambulatory Visit (INDEPENDENT_AMBULATORY_CARE_PROVIDER_SITE_OTHER): Payer: PPO | Admitting: Internal Medicine

## 2016-06-07 VITALS — BP 144/88 | HR 77 | Temp 97.7°F | Resp 12 | Ht 65.0 in | Wt 158.5 lb

## 2016-06-07 DIAGNOSIS — R030 Elevated blood-pressure reading, without diagnosis of hypertension: Secondary | ICD-10-CM

## 2016-06-07 DIAGNOSIS — E785 Hyperlipidemia, unspecified: Secondary | ICD-10-CM | POA: Diagnosis not present

## 2016-06-07 DIAGNOSIS — R42 Dizziness and giddiness: Secondary | ICD-10-CM

## 2016-06-07 DIAGNOSIS — G450 Vertebro-basilar artery syndrome: Secondary | ICD-10-CM

## 2016-06-07 DIAGNOSIS — Z8673 Personal history of transient ischemic attack (TIA), and cerebral infarction without residual deficits: Secondary | ICD-10-CM | POA: Insufficient documentation

## 2016-06-07 DIAGNOSIS — G459 Transient cerebral ischemic attack, unspecified: Secondary | ICD-10-CM

## 2016-06-07 DIAGNOSIS — IMO0001 Reserved for inherently not codable concepts without codable children: Secondary | ICD-10-CM

## 2016-06-07 DIAGNOSIS — R748 Abnormal levels of other serum enzymes: Secondary | ICD-10-CM

## 2016-06-07 NOTE — Assessment & Plan Note (Signed)
additional liver tests and ultrasound were normal,  Suggesting that  Her liver enzyme elevation was NOT due to an autoimmune syndrome,  viral infection, or fatty liver. Repeating hepatic panel

## 2016-06-07 NOTE — Assessment & Plan Note (Signed)
Her home readings have been < 130/800 on 12.5 mg losartan .  Asked to bring home BP machien back to office for calibration.

## 2016-06-07 NOTE — Patient Instructions (Signed)
I am concerned that you had a TIA .  Preventing a stroke is our main concern at this point.   Please increase you aspirin to full strength daily 325 mg   MRI and carotid ultrasounds have been ordered  Return for RN VISIT WITH  YOUR HOME BP MACHINE,  AND FASTING LABS WILL BE DONE AT SAME VISIT    Transient Ischemic Attack A transient ischemic attack (TIA) is a "warning stroke" that causes stroke-like symptoms. Unlike a stroke, a TIA does not cause permanent damage to the brain. The symptoms of a TIA can happen very fast and do not last long. It is important to know the symptoms of a TIA and what to do. This can help prevent a major stroke or death. CAUSES  A TIA is caused by a temporary blockage in an artery in the brain or neck (carotid artery). The blockage does not allow the brain to get the blood supply it needs and can cause different symptoms. The blockage can be caused by either:  A blood clot.  Fatty buildup (plaque) in a neck or brain artery. RISK FACTORS  High blood pressure (hypertension).  High cholesterol.  Diabetes mellitus.  Heart disease.  The buildup of plaque in the blood vessels (peripheral artery disease or atherosclerosis).  The buildup of plaque in the blood vessels that provide blood and oxygen to the brain (carotid artery stenosis).  An abnormal heart rhythm (atrial fibrillation).  Obesity.  Using any tobacco products, including cigarettes, chewing tobacco, or electronic cigarettes.  Taking oral contraceptives, especially in combination with using tobacco.  Physical inactivity.  A diet high in fats, salt (sodium), and calories.  Excessive alcohol use.  Use of illegal drugs (especially cocaine and methamphetamine).  Being female.  Being African American.  Being over the age of 48 years.  Family history of stroke.  Previous history of blood clots, stroke, TIA, or heart attack.  Sickle cell disease. SIGNS AND SYMPTOMS  TIA symptoms are the  same as a stroke but are temporary. These symptoms usually develop suddenly, or may be newly present upon waking from sleep:  Sudden weakness or numbness of the face, arm, or leg, especially on one side of the body.  Sudden trouble walking or difficulty moving arms or legs.  Sudden confusion.  Sudden personality changes.  Trouble speaking (aphasia) or understanding.  Difficulty swallowing.  Sudden trouble seeing in one or both eyes.  Double vision.  Dizziness.  Loss of balance or coordination.  Sudden severe headache with no known cause.  Trouble reading or writing.  Loss of bowel or bladder control.  Loss of consciousness. DIAGNOSIS  Your health care provider may be able to determine the presence or absence of a TIA based on your symptoms, history, and physical exam. CT scan of the brain is usually performed to help identify a TIA. Other tests may include:  Electrocardiography (ECG).  Continuous heart monitoring.  Echocardiography.  Carotid ultrasonography.  MRI.  A scan of the brain circulation.  Blood tests. TREATMENT  Since the symptoms of TIA are the same as a stroke, it is important to seek treatment as soon as possible. You may need a medicine to dissolve a blood clot (thrombolytic) if that is the cause of the TIA. This medicine cannot be given if too much time has passed. Treatment may also include:   Rest, oxygen, fluids through an IV tube, and medicines to thin the blood (anticoagulants).  Measures will be taken to prevent short-term and  long-term complications, including infection from breathing foreign material into the lungs (aspiration pneumonia), blood clots in the legs, and falls.  Procedures to either remove plaque in the carotid arteries or dilate carotid arteries that have narrowed due to plaque. Those procedures are:  Carotid endarterectomy.  Carotid angioplasty and stenting.  Medicines and diet may be used to address diabetes, high blood  pressure, and other underlying risk factors. HOME CARE INSTRUCTIONS   Take medicines only as directed by your health care provider. Follow the directions carefully. Medicines may be used to control risk factors for a stroke. Be sure you understand all your medicine instructions.  You may be told to take aspirin or the anticoagulant warfarin. Warfarin needs to be taken exactly as instructed.  Taking too much or too little warfarin is dangerous. Too much warfarin increases the risk of bleeding. Too little warfarin continues to allow the risk for blood clots. While taking warfarin, you will need to have regular blood tests to measure your blood clotting time. A PT blood test measures how long it takes for blood to clot. Your PT is used to calculate another value called an INR. Your PT and INR help your health care provider to adjust your dose of warfarin. The dose can change for many reasons. It is critically important that you take warfarin exactly as prescribed.  Many foods, especially foods high in vitamin K can interfere with warfarin and affect the PT and INR. Foods high in vitamin K include spinach, kale, broccoli, cabbage, collard and turnip greens, Brussels sprouts, peas, cauliflower, seaweed, and parsley, as well as beef and pork liver, green tea, and soybean oil. You should eat a consistent amount of foods high in vitamin K. Avoid major changes in your diet, or notify your health care provider before changing your diet. Arrange a visit with a dietitian to answer your questions.  Many medicines can interfere with warfarin and affect the PT and INR. You must tell your health care provider about any and all medicines you take; this includes all vitamins and supplements. Be especially cautious with aspirin and anti-inflammatory medicines. Do not take or discontinue any prescribed or over-the-counter medicine except on the advice of your health care provider or pharmacist.  Warfarin can have side  effects, such as excessive bruising or bleeding. You will need to hold pressure over cuts for longer than usual. Your health care provider or pharmacist will discuss other potential side effects.  Avoid sports or activities that may cause injury or bleeding.  Be careful when shaving, flossing your teeth, or handling sharp objects.  Alcohol can change the body's ability to handle warfarin. It is best to avoid alcoholic drinks or consume only very small amounts while taking warfarin. Notify your health care provider if you change your alcohol intake.  Notify your dentist or other health care providers before procedures.  Eat a diet that includes 5 or more servings of fruits and vegetables each day. This may reduce the risk of stroke. Certain diets may be prescribed to address high blood pressure, high cholesterol, diabetes, or obesity.  A diet low in sodium, saturated fat, trans fat, and cholesterol is recommended to manage high blood pressure.  A diet low in saturated fat, trans fat, and cholesterol, and high in fiber may control cholesterol levels.  A controlled-carbohydrate, controlled-sugar diet is recommended to manage diabetes.  A reduced-calorie diet that is low in sodium, saturated fat, trans fat, and cholesterol is recommended to manage obesity.  Maintain a  healthy weight.  Stay physically active. It is recommended that you get at least 30 minutes of activity on most or all days.  Do not use any tobacco products, including cigarettes, chewing tobacco, or electronic cigarettes. If you need help quitting, ask your health care provider.  Limit alcohol intake to no more than 1 drink per day for nonpregnant women and 2 drinks per day for men. One drink equals 12 ounces of beer, 5 ounces of wine, or 1 ounces of hard liquor.  Do not abuse drugs.  A safe home environment is important to reduce the risk of falls. Your health care provider may arrange for specialists to evaluate your  home. Having grab bars in the bedroom and bathroom is often important. Your health care provider may arrange for equipment to be used at home, such as raised toilets and a seat for the shower.  Follow all instructions for follow-up with your health care provider. This is very important. This includes any referrals and lab tests. Proper follow-up can prevent a stroke or another TIA from occurring. PREVENTION  The risk of a TIA can be decreased by appropriately treating high blood pressure, high cholesterol, diabetes, heart disease, and obesity, and by quitting smoking, limiting alcohol, and staying physically active. SEEK MEDICAL CARE IF:  You have personality changes.  You have difficulty swallowing.  You are seeing double.  You have dizziness.  You have a fever. SEEK IMMEDIATE MEDICAL CARE IF:  Any of the following symptoms may represent a serious problem that is an emergency. Do not wait to see if the symptoms will go away. Get medical help right away. Call your local emergency services (911 in [place] Do not drive yourself to the hospital.  You have sudden weakness or numbness of the face, arm, or leg, especially on one side of the body.  You have sudden trouble walking or difficulty moving arms or legs.  You have sudden confusion.  You have trouble speaking (aphasia) or understanding.  You have sudden trouble seeing in one or both eyes.  You have a loss of balance or coordination.  You have a sudden, severe headache with no known cause.  You have new chest pain or an irregular heartbeat.  You have a partial or total loss of consciousness. MAKE SURE YOU:   Understand these instructions.  Will watch your condition.  Will get help right away if you are not doing well or get worse.   This information is not intended to replace advice given to you by your health care provider. Make sure you discuss any questions you have with your health care provider.   Document Released:  07/11/2005 Document Revised: 10/22/2014 Document Reviewed: 01/06/2014 Elsevier Interactive Patient Education [employer].

## 2016-06-07 NOTE — Progress Notes (Signed)
Subjective:  Patient ID: Sally Morgan, female    DOB: December 23, 1945  Age: 70 y.o. MRN: [id number]  CC: The primary encounter diagnosis was Transient cerebral ischemia, unspecified transient cerebral ischemia type. Diagnoses of Hyperlipidemia, Dizziness and giddiness, Vertebrobasilar artery syndrome, White coat hypertension, and Elevated liver enzymes were also pertinent to this visit.  HPI Sally Morgan presents for evaluation of a recent episode of  vertigo that was accompanied by mild  Nausea.  The episode occurred 5 days ago while alone at home in the morning while watering her flowers.  she had not  Had a sudden position change, denied any  recent strenous activity or activities that led to dehydration. Had eaten breakfast, without nausea prior to event. .  No history of URI, allergic rhinitis,  sinus/ear  symptoms or headache. No recent change in medications other than a reduction in losartan dose. Symptoms lasted for 3 hours.  Home BP was 150/90 pulse 90. She had reduced her  Losartan dose from 25 mg to 12.5 mg  Due to persistent dry mouth with higher dose that was disrupting  her sleep.  Home readings using the upper arm cuff are < 130/80 consistently but she has never had the machine calibrated.     RFs include hypertension , untreated mild hyperlipidemia , findings of atherosclerotic placque noted in chest  CT done in 2015, nd aortic root dilatation , being followed with seriaal ECHOs (last one April 2017) .    Outpatient Medications Prior to Visit  Medication Sig Dispense Refill  . aspirin 81 MG tablet Take 81 mg by mouth daily.    Marland Kitchen losartan (COZAAR) 25 MG tablet take 1 tablet by mouth once daily 90 tablet 3  . amoxicillin-clavulanate (AUGMENTIN) 875-125 MG tablet Take 1 tablet by mouth 2 (two) times daily. (Patient not taking: Reported on 06/07/2016) 14 tablet 0  . hydrocortisone (PROCTOZONE-HC) 2.5 % rectal cream Place 1 application rectally 2 (two) times daily. (Patient not taking:  Reported on 06/07/2016) 30 g 0  . promethazine (PHENERGAN) 12.5 MG tablet Take 1 tablet (12.5 mg total) by mouth every 8 (eight) hours as needed for nausea or vomiting. (Patient not taking: Reported on 06/07/2016) 20 tablet 0  . sulfamethoxazole-trimethoprim (BACTRIM DS,SEPTRA DS) 800-160 MG tablet Take 1 tablet by mouth 2 (two) times daily. (Patient not taking: Reported on 06/07/2016) 14 tablet 0  . traZODone (DESYREL) 50 MG tablet Take 0.5-1 tablets (25-50 mg total) by mouth at bedtime as needed for sleep. (Patient not taking: Reported on 06/07/2016) 30 tablet 3   No facility-administered medications prior to visit.     Review of Systems;  Patient denies headache, fevers, malaise, unintentional weight loss, skin rash, eye pain, sinus congestion and sinus pain, sore throat, dysphagia,  hemoptysis , cough, dyspnea, wheezing, chest pain, palpitations, orthopnea, edema, abdominal pain, nausea, melena, diarrhea, constipation, flank pain, dysuria, hematuria, urinary  Frequency, nocturia, numbness, tingling, seizures,  Focal weakness, Loss of consciousness,  Tremor, insomnia, depression, anxiety, and suicidal ideation.      Objective:  BP (!) 144/88   Pulse 77   Temp 97.7 F (36.5 C) (Oral)   Resp 12   Ht 5' 5" (1.651 m)   Wt 158 lb 8 oz (71.9 kg)   SpO2 98%   BMI 26.38 kg/m   BP Readings from Last 3 Encounters:  06/07/16 (!) 144/88  03/05/16 140/86  03/01/16 138/80    Wt Readings from Last 3 Encounters:  06/07/16 158 lb 8 oz (  71.9 kg)  03/05/16 158 lb (71.7 kg)  03/01/16 159 lb (72.1 kg)    General appearance: alert, cooperative and appears stated age Ears: normal TM's and external ear canals both ears Throat: lips, mucosa, and tongue normal; teeth and gums normal Neck: no adenopathy, no carotid bruit, supple, symmetrical, trachea midline and thyroid not enlarged, symmetric, no tenderness/mass/nodules Back: symmetric, no curvature. ROM normal. No CVA tenderness. Lungs: clear to  auscultation bilaterally Heart: regular rate and rhythm, S1, S2 normal, no murmur, click, rub or gallop Abdomen: soft, non-tender; bowel sounds normal; no masses,  no organomegaly Pulses: 2+ and symmetric Skin: Skin color, texture, turgor normal. No rashes or lesions Lymph nodes: Cervical, supraclavicular, and axillary nodes normal. Neuro: CNs 2-12 intact. DTRs 2+/4 in biceps, brachioradialis, patellars and achilles. Muscle strength 5/5 in upper and lower exremities. Fine resting tremor bilaterally both hands cerebellar function normal. Romberg negative.  No pronator drift.   Gait normal.   No results found for: HGBA1C  Lab Results  Component Value Date   CREATININE 0.64 11/01/2015   CREATININE 0.6 03/31/2014   CREATININE 0.7 11/05/2013    Lab Results  Component Value Date   WBC 6.1 11/01/2015   HGB 12.2 11/01/2015   HCT 36.7 11/01/2015   PLT 192.0 11/01/2015   GLUCOSE 92 11/01/2015   CHOL 187 11/01/2015   TRIG 60.0 11/01/2015   HDL 57.20 11/01/2015   LDLDIRECT 121.0 11/01/2015   LDLCALC 118 (H) 11/01/2015   ALT 44 (H) 12/08/2015   AST 33 12/08/2015   NA 140 11/01/2015   K 4.3 11/01/2015   CL 104 11/01/2015   CREATININE 0.64 11/01/2015   BUN 14 11/01/2015   CO2 30 11/01/2015   TSH 1.00 11/01/2015   MICROALBUR 0.4 11/05/2013    Us Abdomen Limited Ruq  Result Date: 12/23/2015 CLINICAL DATA:  Elevated liver enzymes. EXAM: US ABDOMEN LIMITED - RIGHT UPPER QUADRANT COMPARISON:  None. FINDINGS: Gallbladder: No gallstones or wall thickening visualized. No sonographic Murphy sign noted by sonographer. Common bile duct: Diameter: 2.0 mm Liver: No focal lesion identified. Within normal limits in parenchymal echogenicity. IMPRESSION: Normal exam. Electronically Signed   By: Thomas  Register   On: 12/23/2015 09:03    Assessment & Plan:   Problem List Items Addressed This Visit    White coat hypertension    Her home readings have been < 130/800 on 12.5 mg losartan .  Asked to  bring home BP machien back to office for calibration.       Hyperlipidemia    She has deferred therapy in the past despite discussion of guidelines. Reviewed prior CT showing atherosclerosis andcurrent symptoms worrisome for TIA and advised to start high potency statin . She would like to defer until she can return for fasting lipids, which have been ordered.       Relevant Orders   Lipid panel   MR Angiogram Head Wo Contrast   Elevated liver enzymes     additional liver tests and ultrasound were normal,  Suggesting that  Her liver enzyme elevation was NOT due to an autoimmune syndrome,  viral infection, or fatty liver. Repeating hepatic panel       TIA (transient ischemic attack) - Primary    Suggested by recent unprovoked episode of vertigo lasting 3 hours.  Risk factors present.  Advised to increaes aspirin dose to 325.mg daily pending further workup.  Strongly advised to start statin therapy, which she has been deferring.  Carotid ultrasounds, MRI and MRA brain   ordered,       Relevant Orders   US Carotid Bilateral   MR Brain Wo Contrast   MR Angiogram Head Wo Contrast   VAS US CAROTID    Other Visit Diagnoses    Dizziness and giddiness       Relevant Orders   Comp Met (CMET)   B12   MR Angiogram Head Wo Contrast   VAS US CAROTID     A total of 40 minutes was spent with patient more than half of which was spent in counseling patient on the above mentioned issues , reviewing and explaining recent labs and imaging studies done, and coordination of care.  I am having Ms. Batterman maintain her aspirin, traZODone, hydrocortisone, sulfamethoxazole-trimethoprim, promethazine, amoxicillin-clavulanate, and losartan.  No orders of the defined types were placed in this encounter.   There are no discontinued medications.  Follow-up: No Follow-up on file.   TULLO, TERESA L, MD 

## 2016-06-07 NOTE — Assessment & Plan Note (Addendum)
She has deferred therapy in the past despite discussion of guidelines. Reviewed prior CT showing atherosclerosis andcurrent symptoms worrisome for TIA and advised to start high potency statin . She would like to defer until she can return for fasting lipids, which have been ordered.

## 2016-06-07 NOTE — Assessment & Plan Note (Signed)
Suggested by recent unprovoked episode of vertigo lasting 3 hours.  Risk factors present.  Advised to increaes aspirin dose to 325.mg daily pending further workup.  Strongly advised to start statin therapy, which she has been deferring.  Carotid ultrasounds, MRI and MRA brain ordered,

## 2016-06-07 NOTE — Progress Notes (Signed)
Pre-visit discussion using our clinic review tool. No additional management support is needed unless otherwise documented below in the visit note.  

## 2016-06-08 ENCOUNTER — Ambulatory Visit
Admission: RE | Admit: 2016-06-08 | Discharge: 2016-06-08 | Disposition: A | Discharge status: Home / Self Care | Payer: PPO | Source: Ambulatory Visit | Attending: Internal Medicine | Admitting: Internal Medicine

## 2016-06-08 DIAGNOSIS — I10 Essential (primary) hypertension: Secondary | ICD-10-CM | POA: Insufficient documentation

## 2016-06-08 DIAGNOSIS — I7090 Unspecified atherosclerosis: Secondary | ICD-10-CM | POA: Diagnosis not present

## 2016-06-08 DIAGNOSIS — R42 Dizziness and giddiness: Secondary | ICD-10-CM | POA: Diagnosis not present

## 2016-06-08 DIAGNOSIS — G459 Transient cerebral ischemic attack, unspecified: Secondary | ICD-10-CM | POA: Insufficient documentation

## 2016-06-08 DIAGNOSIS — E785 Hyperlipidemia, unspecified: Secondary | ICD-10-CM

## 2016-06-19 ENCOUNTER — Ambulatory Visit (INDEPENDENT_AMBULATORY_CARE_PROVIDER_SITE_OTHER): Payer: PPO

## 2016-06-19 ENCOUNTER — Other Ambulatory Visit (INDEPENDENT_AMBULATORY_CARE_PROVIDER_SITE_OTHER): Payer: PPO

## 2016-06-19 VITALS — BP 136/88 | HR 84

## 2016-06-19 DIAGNOSIS — E785 Hyperlipidemia, unspecified: Secondary | ICD-10-CM | POA: Diagnosis not present

## 2016-06-19 DIAGNOSIS — I10 Essential (primary) hypertension: Secondary | ICD-10-CM

## 2016-06-19 DIAGNOSIS — R42 Dizziness and giddiness: Secondary | ICD-10-CM | POA: Diagnosis not present

## 2016-06-19 LAB — COMPREHENSIVE METABOLIC PANEL
ALBUMIN: 4.3 g/dL (ref 3.5–5.2)
ALK PHOS: 65 U/L (ref 39–117)
ALT: 24 U/L (ref 0–35)
AST: 23 U/L (ref 0–37)
BILIRUBIN TOTAL: 0.5 mg/dL (ref 0.2–1.2)
BUN: 14 mg/dL (ref 6–23)
CO2: 28 mEq/L (ref 19–32)
Calcium: 9.2 mg/dL (ref 8.4–10.5)
Chloride: 106 mEq/L (ref 96–112)
Creatinine, Ser: 0.7 mg/dL (ref 0.40–1.20)
GFR: 88.02 mL/min (ref >60.00)
Glucose, Bld: 93 mg/dL (ref 70–99)
POTASSIUM: 4 meq/L (ref 3.5–5.1)
Sodium: 138 mEq/L (ref 135–145)
TOTAL PROTEIN: 7.4 g/dL (ref 6.0–8.3)

## 2016-06-19 LAB — LIPID PANEL
CHOLESTEROL: 232 mg/dL — AB (ref 0–200)
HDL: 62 mg/dL (ref >39.00)
LDL Cholesterol: 156 mg/dL — ABNORMAL HIGH (ref 0–99)
NonHDL: 170.06
TRIGLYCERIDES: 69 mg/dL (ref 0.0–149.0)
Total CHOL/HDL Ratio: 4
VLDL: 13.8 mg/dL (ref 0.0–40.0)

## 2016-06-19 LAB — VITAMIN B12: VITAMIN B 12: 282 pg/mL (ref 211–911)

## 2016-06-19 NOTE — Progress Notes (Addendum)
I have reviewed and agree with the above documentation. Chart has been reviewed. Blood pressure slightly above goal considering possible recent TIA, though on review of most recent office visit it appears that her home readings have been less than 130/80 on losartan. I would suggest monitoring at home and if not consistently less than 130/80 she will need to let us know and follow-up with her PCP.  Tommi Rumps, M.D.

## 2016-06-19 NOTE — Progress Notes (Signed)
At pt last office visit on 06/07/16 she had elevated BP. Pt is in today to check to see if her meter is correct so she can log BP readings for PCP.

## 2016-06-22 ENCOUNTER — Ambulatory Visit: Payer: PPO

## 2016-06-22 DIAGNOSIS — R42 Dizziness and giddiness: Secondary | ICD-10-CM | POA: Diagnosis not present

## 2016-06-22 DIAGNOSIS — G459 Transient cerebral ischemic attack, unspecified: Secondary | ICD-10-CM

## 2016-06-26 ENCOUNTER — Telehealth: Payer: Self-pay | Admitting: *Deleted

## 2016-06-26 DIAGNOSIS — H81313 Aural vertigo, bilateral: Secondary | ICD-10-CM

## 2016-06-26 NOTE — Telephone Encounter (Signed)
Spoke with the patient, she doesn't recall speaking with Sally Morgan yesterday, but knew about the prescription for the cholesterol medication.  She is very reluctant to take it as she wants to not take medication if possible, wants to know if she can try a diet alternation back to her diet previously and see if that makes her cholesterol better, suggesting to try for 6 months and have labs redrawn then?  Second she realizes that her other tests (IE scans of the brain) were all normal and so regarding the head not feeling clear, she wanted to know your thoughts on a ENT referral? She still has a the heavy feeling at times and states it is similar to a "buzz feeling" after drinking. Thoughts?

## 2016-06-26 NOTE — Telephone Encounter (Signed)
If that's what she watns to do ,  That is fine.  ETN referral has been made.

## 2016-06-26 NOTE — Telephone Encounter (Signed)
Pt has requested to have test results Pt contact [phone]

## 2016-06-26 NOTE — Telephone Encounter (Signed)
Spoke with the patient and notified of the referral, thanks

## 2016-07-03 ENCOUNTER — Encounter: Payer: Self-pay | Admitting: Internal Medicine

## 2016-07-18 DIAGNOSIS — H905 Unspecified sensorineural hearing loss: Secondary | ICD-10-CM | POA: Diagnosis not present

## 2016-07-18 DIAGNOSIS — R42 Dizziness and giddiness: Secondary | ICD-10-CM | POA: Diagnosis not present

## 2016-07-31 ENCOUNTER — Other Ambulatory Visit: Payer: PPO

## 2016-08-01 DIAGNOSIS — H9042 Sensorineural hearing loss, unilateral, left ear, with unrestricted hearing on the contralateral side: Secondary | ICD-10-CM | POA: Diagnosis not present

## 2016-10-11 ENCOUNTER — Ambulatory Visit: Payer: Self-pay

## 2016-10-25 ENCOUNTER — Encounter: Payer: Self-pay | Admitting: Family Medicine

## 2016-10-25 ENCOUNTER — Ambulatory Visit (INDEPENDENT_AMBULATORY_CARE_PROVIDER_SITE_OTHER): Payer: PPO | Admitting: Family Medicine

## 2016-10-25 ENCOUNTER — Encounter (INDEPENDENT_AMBULATORY_CARE_PROVIDER_SITE_OTHER): Payer: Self-pay

## 2016-10-25 VITALS — BP 120/72 | HR 92 | Temp 98.2°F | Wt 160.0 lb

## 2016-10-25 DIAGNOSIS — J111 Influenza due to unidentified influenza virus with other respiratory manifestations: Secondary | ICD-10-CM

## 2016-10-25 DIAGNOSIS — R059 Cough, unspecified: Secondary | ICD-10-CM

## 2016-10-25 DIAGNOSIS — R6883 Chills (without fever): Secondary | ICD-10-CM

## 2016-10-25 DIAGNOSIS — R110 Nausea: Secondary | ICD-10-CM

## 2016-10-25 DIAGNOSIS — R509 Fever, unspecified: Secondary | ICD-10-CM

## 2016-10-25 DIAGNOSIS — R05 Cough: Secondary | ICD-10-CM

## 2016-10-25 LAB — POCT INFLUENZA A: RAPID INFLUENZA A AGN: POSITIVE

## 2016-10-25 MED ORDER — OSELTAMIVIR PHOSPHATE 75 MG PO CAPS: 75.0000 mg | ORAL_CAPSULE | Freq: Two times a day (BID) | ORAL | 0 refills | Status: DC

## 2016-10-25 MED ORDER — ONDANSETRON 4 MG PO TBDP: 4.0000 mg | ORAL_TABLET | Freq: Three times a day (TID) | ORAL | 0 refills | Status: DC | PRN

## 2016-10-25 MED ORDER — GUAIFENESIN-CODEINE 100-10 MG/5ML PO SOLN: 5.0000 mL | Freq: Three times a day (TID) | ORAL | 0 refills | Status: DC | PRN

## 2016-10-25 NOTE — Patient Instructions (Addendum)
For nasal congestion you can use Afrin nasal spray for 3 days max, Sudafed, saline nasal spray (generic is fine for all). Drink enough fluids to make your urine light yellow. For fever/chill/muscle aches you can take over the counter acetaminophen or ibuprofen.  Please come back in if you are not better in 5-7 days or if you develop wheezing, shortness of breath or persistent vomiting.    Influenza, Adult Influenza, more commonly known as "the flu," is a viral infection that primarily affects the respiratory tract. The respiratory tract includes organs that help you breathe, such as the lungs, nose, and throat. The flu causes many common cold symptoms, as well as a high fever and body aches. The flu spreads easily from person to person (is contagious). Getting a flu shot (influenza vaccination) every year is the best way to prevent influenza. What are the causes? Influenza is caused by a virus. You can catch the virus by:  Breathing in droplets from an infected person's cough or sneeze.  Touching something that was recently contaminated with the virus and then touching your mouth, nose, or eyes. What increases the risk? The following factors may make you more likely to get the flu:  Not cleaning your hands frequently with soap and water or alcohol-based hand sanitizer.  Having close contact with many people during cold and flu season.  Touching your mouth, eyes, or nose without washing or sanitizing your hands first.  Not drinking enough fluids or not eating a healthy diet.  Not getting enough sleep or exercise.  Being under a high amount of stress.  Not getting a yearly (annual) flu shot. You may be at a higher risk of complications from the flu, such as a severe lung infection (pneumonia), if you:  Are over the age of 65.  Are pregnant.  Have a weakened disease-fighting system (immune system). You may have a weakened immune system if you:  Have HIV or AIDS.  Are undergoing  chemotherapy.  Aretaking medicines that reduce the activity of (suppress) the immune system.  Have a long-term (chronic) illness, such as heart disease, kidney disease, diabetes, or lung disease.  Have a liver disorder.  Are obese.  Have anemia. What are the signs or symptoms? Symptoms of this condition typically last 4-10 days and may include:  Fever.  Chills.  Headache, body aches, or muscle aches.  Sore throat.  Cough.  Runny or congested nose.  Chest discomfort and cough.  Poor appetite.  Weakness or tiredness (fatigue).  Dizziness.  Nausea or vomiting. How is this diagnosed? This condition may be diagnosed based on your medical history and a physical exam. Your health care provider may do a nose or throat swab test to confirm the diagnosis. How is this treated? If influenza is detected early, you can be treated with antiviral medicine that can reduce the length of your illness and the severity of your symptoms. This medicine may be given by mouth (orally) or through an IV tube that is inserted in one of your veins. The goal of treatment is to relieve symptoms by taking care of yourself at home. This may include taking over-the-counter medicines, drinking plenty of fluids, and adding humidity to the air in your home. In some cases, influenza goes away on its own. Severe influenza or complications from influenza may be treated in a hospital. Follow these instructions at home:  Take over-the-counter and prescription medicines only as told by your health care provider.  Use a cool mist humidifier   to add humidity to the air in your home. This can make breathing easier.  Rest as needed.  Drink enough fluid to keep your urine clear or pale yellow.  Cover your mouth and nose when you cough or sneeze.  Wash your hands with soap and water often, especially after you cough or sneeze. If soap and water are not available, use hand sanitizer.  Stay home from work or  school as told by your health care provider. Unless you are visiting your health care provider, try to avoid leaving home until your fever has been gone for 24 hours without the use of medicine.  Keep all follow-up visits as told by your health care provider. This is important. How is this prevented?  Getting an annual flu shot is the best way to avoid getting the flu. You may get the flu shot in late summer, fall, or winter. Ask your health care provider when you should get your flu shot.  Wash your hands often or use hand sanitizer often.  Avoid contact with people who are sick during cold and flu season.  Eat a healthy diet, drink plenty of fluids, get enough sleep, and exercise regularly. Contact a health care provider if:  You develop new symptoms.  You have:  Chest pain.  Diarrhea.  A fever.  Your cough gets worse.  You produce more mucus.  You feel nauseous or you vomit. Get help right away if:  You develop shortness of breath or difficulty breathing.  Your skin or nails turn a bluish color.  You have severe pain or stiffness in your neck.  You develop a sudden headache or sudden pain in your face or ear.  You cannot stop vomiting. This information is not intended to replace advice given to you by your health care provider. Make sure you discuss any questions you have with your health care provider. Document Released: 09/28/2000 Document Revised: 03/08/2016 Document Reviewed: 07/26/2015 Elsevier Interactive Patient Education  2017 [employer]  

## 2016-10-25 NOTE — Progress Notes (Signed)
Subjective:    Patient ID: MARCINA RISCH, female    DOB: 1946/07/10, 71 y.o.   MRN: [id number]  HPI This is a 71 yo female who presents today with two days of fever to 102, intermittent dry cough, clear watery nasal drainage, muscle aches, no ear pain or sore throat. No SOB, no wheeze. No known sick contacts.  Has taken Tylenol, Mucinex without relief. She has had some mild nausea without vomiting, states she often gets nauseous when ill, requests something for nausea.   Past Medical History:  Diagnosis Date  . Arthritis   . Diverticulitis, colon   . GERD (gastroesophageal reflux disease)    Past Surgical History:  Procedure Laterality Date  . CESAREAN SECTION     x 2    Family History  Problem Relation Age of Onset  . Cancer Brother 58    colon ca  . Early death Mother   . Tuberculosis Mother   . Early death Father   . Appendicitis Father    Social History  Substance Use Topics  . Smoking status: Never Smoker  . Smokeless tobacco: Not on file  . Alcohol use 2.4 oz/week    3 Standard drinks or equivalent, 1 Glasses of wine per week     Comment: OCC      Review of Systems Per HPI    Objective:   Physical Exam  Constitutional: She is oriented to person, place, and time. She appears well-developed and well-nourished. She appears ill. No distress.  HENT:  Head: Normocephalic and atraumatic.  Right Ear: Tympanic membrane, external ear and ear canal normal.  Left Ear: Tympanic membrane, external ear and ear canal normal.  Nose: Nose normal.  Mouth/Throat: Oropharynx is clear and moist.  Eyes: Conjunctivae are normal.  Neck: Normal range of motion. Neck supple.  Cardiovascular: Normal rate, regular rhythm and normal heart sounds.   Pulmonary/Chest: Effort normal and breath sounds normal.  Lymphadenopathy:    She has no cervical adenopathy.  Neurological: She is alert and oriented to person, place, and time.  Skin: Skin is warm and dry. She is not diaphoretic.    Psychiatric: She has a normal mood and affect. Her behavior is normal. Judgment and thought content normal.  Vitals reviewed.     BP 120/72   Pulse 92   Temp 98.2 F (36.8 C)   Wt 160 lb (72.6 kg)   SpO2 96%   BMI 26.63 kg/m  Wt Readings from Last 3 Encounters:  10/25/16 160 lb (72.6 kg)  06/07/16 158 lb 8 oz (71.9 kg)  03/05/16 158 lb (71.7 kg)   Results for orders placed or performed in visit on 10/25/16  POCT Influenza A  Result Value Ref Range   Rapid Influenza A Ag POSITIVE        Assessment & Plan:  1. Chills - POCT Influenza A  2. Fever and chills - POCT Influenza A  3. Influenza - discussed risks and benefits of oseltamivir and patient requested treatment - oseltamivir (TAMIFLU) 75 MG capsule; Take 1 capsule (75 mg total) by mouth 2 (two) times daily.  Dispense: 10 capsule; Refill: 0 - Provided written and verbal information regarding diagnosis and treatment. - encouraged otc analgesics, good fluid intake, rest - RTC precautions reviewed  4. Cough - guaiFENesin-codeine 100-10 MG/5ML syrup; Take 5 mLs by mouth 3 (three) times daily as needed for cough.  Dispense: 118 mL; Refill: 0  5. Nausea without vomiting - ondansetron (ZOFRAN-ODT) 4 MG disintegrating  tablet; Take 1 tablet (4 mg total) by mouth every 8 (eight) hours as needed for nausea or vomiting.  Dispense: 10 tablet; Refill: 0   Clarene Reamer, FNP-BC  [facility]  10/25/2016 2:35 PM

## 2017-01-14 DIAGNOSIS — M2580 Other specified joint disorders, unspecified joint: Secondary | ICD-10-CM | POA: Diagnosis not present

## 2017-01-14 DIAGNOSIS — M659 Synovitis and tenosynovitis, unspecified: Secondary | ICD-10-CM | POA: Diagnosis not present

## 2017-01-14 DIAGNOSIS — M79672 Pain in left foot: Secondary | ICD-10-CM | POA: Diagnosis not present

## 2017-02-04 DIAGNOSIS — H00014 Hordeolum externum left upper eyelid: Secondary | ICD-10-CM | POA: Diagnosis not present

## 2017-02-11 DIAGNOSIS — M2580 Other specified joint disorders, unspecified joint: Secondary | ICD-10-CM | POA: Diagnosis not present

## 2017-02-11 DIAGNOSIS — M7752 Other enthesopathy of left foot: Secondary | ICD-10-CM | POA: Diagnosis not present

## 2017-02-11 DIAGNOSIS — M79672 Pain in left foot: Secondary | ICD-10-CM | POA: Diagnosis not present

## 2017-03-12 DIAGNOSIS — H353131 Nonexudative age-related macular degeneration, bilateral, early dry stage: Secondary | ICD-10-CM | POA: Diagnosis not present

## 2017-04-03 ENCOUNTER — Telehealth: Payer: Self-pay | Admitting: Internal Medicine

## 2017-04-03 DIAGNOSIS — R5383 Other fatigue: Secondary | ICD-10-CM

## 2017-04-03 DIAGNOSIS — E785 Hyperlipidemia, unspecified: Secondary | ICD-10-CM

## 2017-04-03 NOTE — Telephone Encounter (Signed)
Pt called and wanted to know if she needed labs done before her 8/22 appt. Please advise, thank you!  When scheduling she would like end of July.  Call pt @ [phone]

## 2017-04-03 NOTE — Telephone Encounter (Signed)
Spoke with pt and scheduled her lab appt. Pt is aware of appt date and time. Labs have been ordered.

## 2017-04-08 ENCOUNTER — Ambulatory Visit (INDEPENDENT_AMBULATORY_CARE_PROVIDER_SITE_OTHER): Payer: PPO

## 2017-04-08 VITALS — BP 130/70 | HR 74 | Temp 97.6°F | Resp 12 | Ht 65.0 in | Wt 155.1 lb

## 2017-04-08 DIAGNOSIS — Z1389 Encounter for screening for other disorder: Secondary | ICD-10-CM | POA: Diagnosis not present

## 2017-04-08 DIAGNOSIS — Z0000 Encounter for general adult medical examination without abnormal findings: Secondary | ICD-10-CM

## 2017-04-08 DIAGNOSIS — Z1331 Encounter for screening for depression: Secondary | ICD-10-CM

## 2017-04-08 DIAGNOSIS — Z1239 Encounter for other screening for malignant neoplasm of breast: Secondary | ICD-10-CM

## 2017-04-08 NOTE — Progress Notes (Signed)
Subjective:   Sally Morgan is a 71 y.o. female who presents for Medicare Annual (Subsequent) preventive examination.  Review of Systems:  No ROS.  Medicare Wellness Visit. Additional risk factors are reflected in the social history. Cardiac Risk Factors include: advanced age (>9men, >82 women)     Objective:     Vitals: BP 130/70 (BP Location: Left Arm, Patient Position: Sitting, Cuff Size: Normal)   Pulse 74   Temp 97.6 F (36.4 C) (Oral)   Resp 12   Ht 5\' 5"  (1.651 m)   Wt 155 lb 1.9 oz (70.4 kg)   SpO2 97%   BMI 25.81 kg/m   Body mass index is 25.81 kg/m.   Tobacco History  Smoking Status  . Never Smoker  Smokeless Tobacco  . Never Used     Counseling given: Not Answered   Past Medical History:  Diagnosis Date  . Arthritis   . Diverticulitis, colon   . GERD (gastroesophageal reflux disease)    Past Surgical History:  Procedure Laterality Date  . CESAREAN SECTION     x 2    Family History  Problem Relation Age of Onset  . Cancer Brother 15       colon ca  . Early death Mother   . Tuberculosis Mother   . Early death Father   . Appendicitis Father    History  Sexual Activity  . Sexual activity: Not Currently    Outpatient Encounter Prescriptions as of 04/08/2017  Medication Sig  . aspirin 81 MG tablet Take 81 mg by mouth daily.  Marland Kitchen losartan (COZAAR) 25 MG tablet take 1 tablet by mouth once daily  . [DISCONTINUED] guaiFENesin-codeine 100-10 MG/5ML syrup Take 5 mLs by mouth 3 (three) times daily as needed for cough.  . [DISCONTINUED] ondansetron (ZOFRAN-ODT) 4 MG disintegrating tablet Take 1 tablet (4 mg total) by mouth every 8 (eight) hours as needed for nausea or vomiting.  . [DISCONTINUED] oseltamivir (TAMIFLU) 75 MG capsule Take 1 capsule (75 mg total) by mouth 2 (two) times daily.   No facility-administered encounter medications on file as of 04/08/2017.     Activities of Daily Living In your present state of health, do you have any  difficulty performing the following activities: 04/08/2017  Hearing? N  Vision? N  Difficulty concentrating or making decisions? N  Walking or climbing stairs? N  Dressing or bathing? N  Doing errands, shopping? N  Preparing Food and eating ? N  Using the Toilet? N  In the past six months, have you accidently leaked urine? N  Do you have problems with loss of bowel control? N  Managing your Medications? N  Managing your Finances? N  Housekeeping or managing your Housekeeping? N  Some recent data might be hidden    Patient Care Team: Crecencio Mc, MD as PCP - General (Internal Medicine) Minna Merritts, MD as Consulting Physician (Cardiology)    Assessment:    This is a routine wellness examination for Sally Morgan. The goal of the wellness visit is to assist the patient how to close the gaps in care and create a preventative care plan for the patient.   The roster of all physicians providing medical care to patient is listed in the Snapshot section of the chart.  Osteoporosis risk reviewed.    Safety issues reviewed; Smoking detectors in the home. Firearms locked in a safe within the home.  Wears seatbelts when driving or riding with others. Patient does wear sunscreen or  protective clothing when in direct sunlight. No violence in the home.  Depression- PHQ 2 &9 complete.  No signs/symptoms or verbal communication regarding little pleasure in doing things, feeling down, depressed or hopeless. No changes in sleeping, energy, eating, concentrating.  No thoughts of self harm or harm towards others.  Time spent on this topic is 8 minutes.   Patient is alert, normal appearance, oriented to person/place/and time.  Correctly identified the president of the [place], recall of 3/3 words, and performing simple calculations. Displays appropriate judgement and can read correct time from watch face.   No new identified risk were noted.  No failures at ADL's or IADL's.   BMI- discussed the  importance of a healthy diet, water intake and the benefits of aerobic exercise. Educational material provided.   Daily fluid intake: 1 cups of caffeine, 5 cups of water  Dental- every 6 months.    Eye- Visual acuity not assessed per patient preference since they have regular follow up with the ophthalmologist.  Wears corrective lenses.  Sleep patterns- Sleeps 5 hours at night.  Wakes feeling rested.    Mammogram ordered; follow as directed.  Educational material provided.  TDAP vaccine deferred per patient preference.  Follow up with insurance.  Educational material provided.  Patient Concerns: None at this time. Follow up with PCP as needed.  Exercise Activities and Dietary recommendations Current Exercise Habits: Structured exercise class, Type of exercise: walking, Frequency (Times/Week): 3, Intensity: Moderate  Goals    . Increase physical activity          Maintain walking regimen  Attend Silver Sneakers program with husband during the winter      Fall Risk Fall Risk  04/08/2017 10/12/2015 11/03/2013  Falls in the past year? No No No   Depression Screen PHQ 2/9 Scores 04/08/2017 10/12/2015 11/03/2013  PHQ - 2 Score 0 0 0  PHQ- 9 Score 0 - -     Cognitive Function MMSE - Mini Mental State Exam 04/08/2017 10/12/2015  Orientation to time 5 5  Orientation to Place 5 5  Registration 3 3  Attention/ Calculation 5 5  Recall 3 3  Language- name 2 objects 2 2  Language- repeat 1 1  Language- follow 3 step command 3 3  Language- read & follow direction 1 1  Write a sentence 1 1  Copy design 1 1  Total score 30 30        Immunization History  Administered Date(s) Administered  . Influenza Whole 08/06/2010  . Pneumococcal Conjugate-13 11/02/2013  . Pneumococcal Polysaccharide-23 12/08/2015  . Td 02/04/2003   Screening Tests Health Maintenance  Topic Date Due  . TETANUS/TDAP  02/03/2013  . MAMMOGRAM  11/25/2015  . INFLUENZA VACCINE  05/15/2017  . COLONOSCOPY   02/24/2022  . DEXA SCAN  Completed  . Hepatitis C Screening  Completed      Plan:    End of life planning; Advance aging; Advanced directives discussed. Copy of current HCPOA/Living Will requested.    I have personally reviewed and noted the following in the patient's chart:   . Medical and social history . Use of alcohol, tobacco or illicit drugs  . Current medications and supplements . Functional ability and status . Nutritional status . Physical activity . Advanced directives . List of other physicians . Hospitalizations, surgeries, and ER visits in previous 12 months . Vitals . Screenings to include cognitive, depression, and falls . Referrals and appointments  In addition, I have reviewed and discussed  with patient certain preventive protocols, quality metrics, and best practice recommendations. A written personalized care plan for preventive services as well as general preventive health recommendations were provided to patient.    Arby Barrette, Denisa L, LPN  06/12/5620   I have reviewed the above information and agree with above.   Deborra Medina, MD

## 2017-04-08 NOTE — Patient Instructions (Addendum)
Sally Morgan , Thank you for taking time to come for your Medicare Wellness Visit. I appreciate your ongoing commitment to your health goals. Please review the following plan we discussed and let me know if I can assist you in the future.   Follow up with Dr. Derrel Nip as needed.    Bring a copy of your Leal and/or Living Will to be scanned into chart.  Mammogram ordered, follow as directed.  Have a great day!  These are the goals we discussed: Goals    . Increase physical activity          Maintain walking regimen  Attend [employer]ne[employer] program with husband during the winter       This is a list of the screening recommended for you and due dates:  Health Maintenance  Topic Date Due  . Tetanus Vaccine  02/03/2013  . Mammogram  11/25/2015  . Flu Shot  05/15/2017  . Colon Cancer Screening  02/24/2022  . DEXA scan (bone density measurement)  Completed  .  Hepatitis C: One time screening is recommended by Center for Disease Control  (CDC) for  adults born from [birth year] through 1964.   Completed    Mammogram A mammogram is an X-ray of the breasts that is done to check for abnormal changes. This procedure can screen for and detect any changes that may suggest breast cancer. A mammogram can also identify other changes and variations in the breast, such as:  Inflammation of the breast tissue (mastitis).  An infected area that contains a collection of pus (abscess).  A fluid-filled sac (cyst).  Fibrocystic changes. This is when breast tissue becomes denser, which can make the tissue feel rope-like or uneven under the skin.  Tumors that are not cancerous (benign).  Tell a health care provider about:  Any allergies you have.  If you have breast implants.  If you have had previous breast disease, biopsy, or surgery.  If you are breastfeeding.  Any possibility that you could be pregnant, if this applies.  If you are younger than age 63.  If you have a  family history of breast cancer. What are the risks? Generally, this is a safe procedure. However, problems may occur, including:  Exposure to radiation. Radiation levels are very low with this test.  The results being misinterpreted.  The need for further tests.  The inability of the mammogram to detect certain cancers.  What happens before the procedure?  Schedule your test about 1-2 weeks after your menstrual period. This is usually when your breasts are the least tender.  If you have had a mammogram done at a different facility in the past, get the mammogram X-rays or have them sent to your current exam facility in order to compare them.  Wash your breasts and under your arms the day of the test.  Do not wear deodorants, perfumes, lotions, or powders anywhere on your body on the day of the test.  Remove any jewelry from your neck.  Wear clothes that you can change into and out of easily. What happens during the procedure?  You will undress from the waist up and put on a gown.  You will stand in front of the X-ray machine.  Each breast will be placed between two plastic or glass plates. The plates will compress your breast for a few seconds. Try to stay as relaxed as possible during the procedure. This does not cause any harm to your breasts  and any discomfort you feel will be very brief.  X-rays will be taken from different angles of each breast. The procedure may vary among health care providers and hospitals. What happens after the procedure?  The mammogram will be examined by a specialist (radiologist).  You may need to repeat certain parts of the test, depending on the quality of the images. This is commonly done if the radiologist needs a better view of the breast tissue.  Ask when your test results will be ready. Make sure you get your test results.  You may resume your normal activities. This information is not intended to replace advice given to you by your  health care provider. Make sure you discuss any questions you have with your health care provider. Document Released: 09/28/2000 Document Revised: 03/05/2016 Document Reviewed: 12/10/2014 Elsevier Interactive Patient Education  2017 [employer].

## 2017-04-12 ENCOUNTER — Telehealth: Payer: Self-pay | Admitting: Internal Medicine

## 2017-04-12 NOTE — Telephone Encounter (Signed)
Wrong pt

## 2017-04-12 NOTE — Telephone Encounter (Signed)
Pt called about wanting to get Urine results from Tuesday pt is still having some symptoms. Please advise?  Call pt @ [phone]. Thank you!

## 2017-04-16 ENCOUNTER — Other Ambulatory Visit: Payer: Self-pay | Admitting: Cardiovascular Disease

## 2017-05-07 ENCOUNTER — Ambulatory Visit
Admission: RE | Admit: 2017-05-07 | Discharge: 2017-05-07 | Disposition: A | Discharge status: Home / Self Care | Payer: PPO | Source: Ambulatory Visit | Attending: Internal Medicine | Admitting: Internal Medicine

## 2017-05-07 DIAGNOSIS — Z1239 Encounter for other screening for malignant neoplasm of breast: Secondary | ICD-10-CM

## 2017-05-07 DIAGNOSIS — Z1231 Encounter for screening mammogram for malignant neoplasm of breast: Secondary | ICD-10-CM | POA: Diagnosis not present

## 2017-05-10 ENCOUNTER — Other Ambulatory Visit (INDEPENDENT_AMBULATORY_CARE_PROVIDER_SITE_OTHER): Payer: PPO

## 2017-05-10 DIAGNOSIS — E785 Hyperlipidemia, unspecified: Secondary | ICD-10-CM | POA: Diagnosis not present

## 2017-05-10 DIAGNOSIS — R5383 Other fatigue: Secondary | ICD-10-CM

## 2017-05-10 LAB — COMPREHENSIVE METABOLIC PANEL
ALBUMIN: 4.4 g/dL (ref 3.5–5.2)
ALK PHOS: 54 U/L (ref 39–117)
ALT: 17 U/L (ref 0–35)
AST: 20 U/L (ref 0–37)
BILIRUBIN TOTAL: 0.8 mg/dL (ref 0.2–1.2)
BUN: 14 mg/dL (ref 6–23)
CALCIUM: 9.6 mg/dL (ref 8.4–10.5)
CHLORIDE: 103 meq/L (ref 96–112)
CO2: 28 mEq/L (ref 19–32)
CREATININE: 0.72 mg/dL (ref 0.40–1.20)
GFR: 84.98 mL/min (ref >60.00)
Glucose, Bld: 90 mg/dL (ref 70–99)
Potassium: 4.1 mEq/L (ref 3.5–5.1)
SODIUM: 139 meq/L (ref 135–145)
TOTAL PROTEIN: 7.4 g/dL (ref 6.0–8.3)

## 2017-05-10 LAB — LIPID PANEL
CHOLESTEROL: 213 mg/dL — AB (ref 0–200)
HDL: 55.4 mg/dL (ref >39.00)
LDL CALC: 144 mg/dL — AB (ref 0–99)
NonHDL: 157.47
TRIGLYCERIDES: 69 mg/dL (ref 0.0–149.0)
Total CHOL/HDL Ratio: 4
VLDL: 13.8 mg/dL (ref 0.0–40.0)

## 2017-05-10 LAB — CBC WITH DIFFERENTIAL/PLATELET
Basophils Absolute: 0 10*3/uL (ref 0.0–0.1)
Basophils Relative: 0.3 % (ref 0.0–3.0)
EOS ABS: 0.1 10*3/uL (ref 0.0–0.7)
EOS PCT: 1.9 % (ref 0.0–5.0)
HCT: 38.3 % (ref 36.0–46.0)
HEMOGLOBIN: 12.9 g/dL (ref 12.0–15.0)
Lymphocytes Relative: 48.3 % — ABNORMAL HIGH (ref 12.0–46.0)
Lymphs Abs: 1.7 10*3/uL (ref 0.7–4.0)
MCHC: 33.7 g/dL (ref 30.0–36.0)
MCV: 93.7 fl (ref 78.0–100.0)
MONO ABS: 0.4 10*3/uL (ref 0.1–1.0)
Monocytes Relative: 12.1 % — ABNORMAL HIGH (ref 3.0–12.0)
NEUTROS PCT: 37.4 % — AB (ref 43.0–77.0)
Neutro Abs: 1.3 10*3/uL — ABNORMAL LOW (ref 1.4–7.7)
Platelets: 214 10*3/uL (ref 150.0–400.0)
RBC: 4.09 Mil/uL (ref 3.87–5.11)
RDW: 12.7 % (ref 11.5–15.5)
WBC: 3.5 10*3/uL — AB (ref 4.0–10.5)

## 2017-05-10 LAB — VITAMIN D 25 HYDROXY (VIT D DEFICIENCY, FRACTURES): VITD: 23.19 ng/mL — AB (ref 30.00–100.00)

## 2017-05-10 LAB — LDL CHOLESTEROL, DIRECT: LDL DIRECT: 142 mg/dL

## 2017-05-10 LAB — TSH: TSH: 1.23 u[IU]/mL (ref 0.35–4.50)

## 2017-05-14 ENCOUNTER — Telehealth: Payer: Self-pay | Admitting: Internal Medicine

## 2017-05-14 NOTE — Telephone Encounter (Signed)
Pt called back in regards to test results. Pt states that she would like a call back today, she is leaving to go out of town tomorrow. Please advise, thank you!  Call pt @ [phone]

## 2017-05-14 NOTE — Telephone Encounter (Signed)
Please see result note message.  

## 2017-05-29 ENCOUNTER — Encounter: Payer: Self-pay | Admitting: Cardiovascular Disease

## 2017-06-03 ENCOUNTER — Ambulatory Visit: Payer: PPO | Admitting: Internal Medicine

## 2017-06-05 ENCOUNTER — Ambulatory Visit (INDEPENDENT_AMBULATORY_CARE_PROVIDER_SITE_OTHER): Payer: PPO

## 2017-06-05 ENCOUNTER — Encounter: Payer: Self-pay | Admitting: Internal Medicine

## 2017-06-05 ENCOUNTER — Ambulatory Visit (INDEPENDENT_AMBULATORY_CARE_PROVIDER_SITE_OTHER): Payer: PPO | Admitting: Internal Medicine

## 2017-06-05 VITALS — BP 150/72 | HR 74 | Temp 97.8°F | Resp 16 | Ht 65.0 in | Wt 157.6 lb

## 2017-06-05 DIAGNOSIS — D126 Benign neoplasm of colon, unspecified: Secondary | ICD-10-CM

## 2017-06-05 DIAGNOSIS — M7731 Calcaneal spur, right foot: Secondary | ICD-10-CM | POA: Diagnosis not present

## 2017-06-05 DIAGNOSIS — Z0000 Encounter for general adult medical examination without abnormal findings: Secondary | ICD-10-CM

## 2017-06-05 DIAGNOSIS — D709 Neutropenia, unspecified: Secondary | ICD-10-CM | POA: Diagnosis not present

## 2017-06-05 DIAGNOSIS — I10 Essential (primary) hypertension: Secondary | ICD-10-CM

## 2017-06-05 DIAGNOSIS — E7800 Pure hypercholesterolemia, unspecified: Secondary | ICD-10-CM | POA: Diagnosis not present

## 2017-06-05 DIAGNOSIS — M79673 Pain in unspecified foot: Secondary | ICD-10-CM | POA: Diagnosis not present

## 2017-06-05 DIAGNOSIS — F5105 Insomnia due to other mental disorder: Secondary | ICD-10-CM

## 2017-06-05 DIAGNOSIS — Z8673 Personal history of transient ischemic attack (TIA), and cerebral infarction without residual deficits: Secondary | ICD-10-CM

## 2017-06-05 DIAGNOSIS — I700 Atherosclerosis of aorta: Secondary | ICD-10-CM

## 2017-06-05 DIAGNOSIS — F409 Phobic anxiety disorder, unspecified: Secondary | ICD-10-CM

## 2017-06-05 DIAGNOSIS — M79671 Pain in right foot: Secondary | ICD-10-CM | POA: Diagnosis not present

## 2017-06-05 DIAGNOSIS — M85859 Other specified disorders of bone density and structure, unspecified thigh: Secondary | ICD-10-CM

## 2017-06-05 DIAGNOSIS — G2581 Restless legs syndrome: Secondary | ICD-10-CM | POA: Diagnosis not present

## 2017-06-05 LAB — CBC WITH DIFFERENTIAL/PLATELET
BASOS PCT: 0.5 % (ref 0.0–3.0)
Basophils Absolute: 0 10*3/uL (ref 0.0–0.1)
EOS PCT: 1.5 % (ref 0.0–5.0)
Eosinophils Absolute: 0.1 10*3/uL (ref 0.0–0.7)
HCT: 38.6 % (ref 36.0–46.0)
HEMOGLOBIN: 13 g/dL (ref 12.0–15.0)
LYMPHS ABS: 2.1 10*3/uL (ref 0.7–4.0)
Lymphocytes Relative: 46.6 % — ABNORMAL HIGH (ref 12.0–46.0)
MCHC: 33.7 g/dL (ref 30.0–36.0)
MCV: 93.6 fl (ref 78.0–100.0)
MONO ABS: 0.5 10*3/uL (ref 0.1–1.0)
Monocytes Relative: 11.2 % (ref 3.0–12.0)
NEUTROS PCT: 40.2 % — AB (ref 43.0–77.0)
Neutro Abs: 1.9 10*3/uL (ref 1.4–7.7)
PLATELETS: 244 10*3/uL (ref 150.0–400.0)
RBC: 4.12 Mil/uL (ref 3.87–5.11)
RDW: 12.9 % (ref 11.5–15.5)
WBC: 4.6 10*3/uL (ref 4.0–10.5)

## 2017-06-05 MED ORDER — TETANUS-DIPHTH-ACELL PERTUSSIS 5-2.5-18.5 LF-MCG/0.5 IM SUSP: 0.5000 mL | Freq: Once | INTRAMUSCULAR | 0 refills | Status: AC

## 2017-06-05 NOTE — Patient Instructions (Addendum)
I recommend getting the majority of your calcium and Vitamin D  through diet rather than supplements given the recent association of calcium supplements with increased coronary artery calcium scores   YOUR GOAL IS 1200 MG DAILY ,  GET HALF THROUGH DIET    Your leg discomfort sounds like "Restless Legs",  I am checking your iron studies first.    Restless Legs Syndrome Restless legs syndrome is a condition that causes uncomfortable feelings or sensations in the legs, especially while sitting or lying down. The sensations usually cause an overwhelming urge to move the legs. The arms can also sometimes be affected. The condition can range from mild to severe. The symptoms often interfere with a person's ability to sleep. What are the causes? The cause of this condition is not known. What increases the risk? This condition is more likely to develop in:  People who are older than age 27.  Pregnant women. In general, restless legs syndrome is more common in women than in men.  People who have a family history of the condition.  People who have certain medical conditions, such as iron deficiency, kidney disease, Parkinson disease, or nerve damage.  People who take certain medicines, such as medicines for high blood pressure, nausea, colds, allergies, depression, and some heart conditions.  What are the signs or symptoms? The main symptom of this condition is uncomfortable sensations in the legs. These sensations may be:  Described as pulling, tingling, prickling, throbbing, crawling, or burning.  Worse while you are sitting or lying down.  Worse during periods of rest or inactivity.  Worse at night, often interfering with your sleep.  Accompanied by a very strong urge to move your legs.  Temporarily relieved by movement of your legs.  The sensations usually affect both sides of the body. The arms can also be affected, but this is rare. People who have this condition often have  tiredness during the day because of their lack of sleep at night. How is this diagnosed? This condition may be diagnosed based on your description of the symptoms. You may also have tests, including blood tests, to check for other conditions that may lead to your symptoms. In some cases, you may be asked to spend some time in a sleep lab so your sleeping can be monitored. How is this treated? Treatment for this condition is focused on managing the symptoms. Treatment may include:  Self-help and lifestyle changes.  Medicines.  Follow these instructions at home:  Take medicines only as directed by your health care provider.  Try these methods to get temporary relief from the uncomfortable sensations: ? Massage your legs. ? Walk or stretch. ? Take a cold or hot bath.  Practice good sleep habits. For example, go to bed and get up at the same time every day.  Exercise regularly.  Practice ways of relaxing, such as yoga or meditation.  Avoid caffeine and alcohol.  Do not use any tobacco products, including cigarettes, chewing tobacco, or electronic cigarettes. If you need help quitting, ask your health care provider.  Keep all follow-up visits as directed by your health care provider. This is important. Contact a health care provider if: Your symptoms do not improve with treatment, or they get worse. This information is not intended to replace advice given to you by your health care provider. Make sure you discuss any questions you have with your health care provider. Document Released: 09/21/2002 Document Revised: 03/08/2016 Document Reviewed: 09/27/2014 Elsevier Interactive Patient Education  2018 Elsevier Inc[employer]  

## 2017-06-05 NOTE — Progress Notes (Signed)
Subjective:  Patient ID: Sally Morgan, female    DOB: 24-Nov-1945  Age: 71 y.o. MRN: [id number]  CC: The primary encounter diagnosis was Pain of plantar aspect of heel. Diagnoses of Restless legs, White coat syndrome with hypertension, Osteopenia of neck of femur, unspecified laterality, Neutropenia, unspecified type ([place]), History of TIA (transient ischemic attack), Pure hypercholesterolemia, Insomnia due to anxiety and fear, Restless legs syndrome, Aortic atherosclerosis ([place]), Adenomatous polyp of colon, unspecified part of colon, Pain of right heel, and Encounter for preventive health examination were also pertinent to this visit.  HPI FRIDA WAHLSTROM presents for follow up on hypertension. Last seen one year ago  Overdue for TdaP vaccine Mammogram was normal  July 2018 Colonoscopy was last done in 2013 , 5 yr follow up due,  REPEAT SET UP FOR October  DEXA 2015  Hypertension: home machine has been  calibrated to be accurate per September rn visit   .Patient is taking her medications as prescribed and notes no adverse effects.  Home BP readings have been done about once per week and are  generally < 130/80 .  She is avoiding added salt in her diet and walking regularly about 3 times per week for exercise  .  Worrying about  The effects of again on her joints.  Has been having  Right foot heel pain aggravated by exercising , even walking  Causes pain. No history of trauma. Would like to see podiatry for 2nd opinon (saw [facility] for left foot issue years ago, received cortisone shot in April which has resolved the left foot pain).  Hyperlipidemia:  She has been  hesitant to start statin therapy because she has an uncomfortable feeling in her legs at night.  Describing restless feeling  . Wants to see Rockey Situ and get risk stratified .  No documentation of statin trial/intolerance       Outpatient Medications Prior to Visit  Medication Sig Dispense Refill  . aspirin 81 MG tablet Take 81  mg by mouth daily.    Marland Kitchen losartan (COZAAR) 25 MG tablet take 1 tablet by mouth once daily 30 tablet 0   No facility-administered medications prior to visit.     Review of Systems;  Patient denies headache, fevers, malaise, unintentional weight loss, skin rash, eye pain, sinus congestion and sinus pain, sore throat, dysphagia,  hemoptysis , cough, dyspnea, wheezing, chest pain, palpitations, orthopnea, edema, abdominal pain, nausea, melena, diarrhea, constipation, flank pain, dysuria, hematuria, urinary  Frequency, nocturia, numbness, tingling, seizures,  Focal weakness, Loss of consciousness,  Tremor, insomnia, depression, anxiety, and suicidal ideation.      Objective:  BP (!) 150/72 (BP Location: Left Arm, Patient Position: Sitting, Cuff Size: Normal)   Pulse 74   Temp 97.8 F (36.6 C) (Oral)   Resp 16   Ht 5\' 5"  (1.651 m)   Wt 157 lb 9.6 oz (71.5 kg)   SpO2 97%   BMI 26.23 kg/m   BP Readings from Last 3 Encounters:  06/05/17 (!) 150/72  04/08/17 130/70  10/25/16 120/72    Wt Readings from Last 3 Encounters:  06/05/17 157 lb 9.6 oz (71.5 kg)  04/08/17 155 lb 1.9 oz (70.4 kg)  10/25/16 160 lb (72.6 kg)    General appearance: alert, cooperative and appears stated age Ears: normal TM's and external ear canals both ears Throat: lips, mucosa, and tongue normal; teeth and gums normal Neck: no adenopathy, no carotid bruit, supple, symmetrical, trachea midline and thyroid not enlarged, symmetric, no  tenderness/mass/nodules Back: symmetric, no curvature. ROM normal. No CVA tenderness. Lungs: clear to auscultation bilaterally Heart: regular rate and rhythm, S1, S2 normal, no murmur, click, rub or gallop Abdomen: soft, non-tender; bowel sounds normal; no masses,  no organomegaly Pulses: 2+ and symmetric Skin: Skin color, texture, turgor normal. No rashes or lesions Lymph nodes: Cervical, supraclavicular, and axillary nodes normal.  No results found for: HGBA1C  Lab Results    Component Value Date   CREATININE 0.72 05/10/2017   CREATININE 0.70 06/19/2016   CREATININE 0.64 11/01/2015    Lab Results  Component Value Date   WBC 4.6 06/05/2017   HGB 13.0 06/05/2017   HCT 38.6 06/05/2017   PLT 244.0 06/05/2017   GLUCOSE 90 05/10/2017   CHOL 213 (H) 05/10/2017   TRIG 69.0 05/10/2017   HDL 55.40 05/10/2017   LDLDIRECT 142.0 05/10/2017   LDLCALC 144 (H) 05/10/2017   ALT 17 05/10/2017   AST 20 05/10/2017   NA 139 05/10/2017   K 4.1 05/10/2017   CL 103 05/10/2017   CREATININE 0.72 05/10/2017   BUN 14 05/10/2017   CO2 28 05/10/2017   TSH 1.23 05/10/2017   MICROALBUR 0.4 11/05/2013    Mammogram Digital Screening  Result Date: 05/07/2017 CLINICAL DATA:  Screening. EXAM: DIGITAL SCREENING BILATERAL MAMMOGRAM WITH CAD COMPARISON:  None. ACR Breast Density Category c: The breast tissue is heterogeneously dense, which may obscure small masses FINDINGS: There are no findings suspicious for malignancy. Images were processed with CAD. IMPRESSION: No mammographic evidence of malignancy. A result letter of this screening mammogram will be mailed directly to the patient. RECOMMENDATION: Screening mammogram in one year. (Code:SM-B-01Y) BI-RADS CATEGORY  1: Negative. Electronically Signed   By: Fidela Salisbury M.D.   On: 05/07/2017 14:23    Assessment & Plan:   Problem List Items Addressed This Visit    Adenomatous polyp of colon    She is scheduled for GI follow up       Aortic atherosclerosis ([place])    noed on chest angiogram in 2017.  Statin advised but deferred by patient.      Encounter for preventive health examination    Annual comprehensive preventive exam was done as well as an evaluation and management of chronic conditions .  During the course of the visit the patient was educated and counseled about appropriate screening and preventive services including :  diabetes screening, lipid analysis with projected  10 year  risk for CAD , nutrition  counseling, breast, cervical and colorectal cancer screening, and recommended immunizations.  Printed recommendations for health maintenance screenings was give      History of TIA (transient ischemic attack)    Suggested by unprovoked episode of vertigo lasting 3 hours in 2017.  MRI/MRA and carotid ultrasouds were  normal for age,  but prior CT of chest noted atherosclerotc placque in thoracic aorta .  continue asa daily,  Advised to consider statin therapy but she has deferred.       Hyperlipidemia    Untreated due to fear of statins.  10 yr risk of ASCVG using FRC is 20% .  She has deferred until she can discuss with Dr Rockey Situ   Lab Results  Component Value Date   CHOL 213 (H) 05/10/2017   HDL 55.40 05/10/2017   LDLCALC 144 (H) 05/10/2017   LDLDIRECT 142.0 05/10/2017   TRIG 69.0 05/10/2017   CHOLHDL 4 05/10/2017         Insomnia due to anxiety and fear  Current symptoms suggest restless leg syndrome,  Iron stores are normal.  Trial of requip      Neutropenia (HCC)    Mild, intermittent, she has no history of recurrent bacterial infections.  B12 level was < 300 so supplementation recommended.   Lab Results  Component Value Date   HALPFXTK24 097 06/19/2016     Lab Results  Component Value Date   WBC 4.6 06/05/2017   HGB 13.0 06/05/2017   HCT 38.6 06/05/2017   MCV 93.6 06/05/2017   PLT 244.0 06/05/2017         RESOLVED: Osteopenia    Bone Density scores  From 2015 reviewed with patient today she has osteopenia,  Moderate.  Would repeat in 2 years and consider therapy then if there is a significant change. Continue calcium, vitamin d and weight bearing exercise on a regular basis.       Pain of right heel    Bone spur noted on palin films done today.  Podiatry referral in process.      Restless legs syndrome    Trial of ropiniorole offered,  IDA ruled out.       White coat syndrome with hypertension    Home readings have been normal and machine has been  certified to be accurate.  Renal function and lytes are normal. No changes today; continue losartan 25 mg daily.   Lab Results  Component Value Date   CREATININE 0.72 05/10/2017   Lab Results  Component Value Date   NA 139 05/10/2017   K 4.1 05/10/2017   CL 103 05/10/2017   CO2 28 05/10/2017          Other Visit Diagnoses    Pain of plantar aspect of heel    -  Primary   Relevant Orders   DG Foot Complete Right (Completed)   Restless legs       Relevant Orders   CBC with Differential/Platelet (Completed)   Iron, TIBC and Ferritin Panel (Completed)     A total of 40 minutes was spent with patient more than half of which was spent in counseling patient on the above mentioned issues , reviewing and explaining recent labs and imaging studies done, and coordination of care. I am having Ms. Imperato start on Tdap. I am also having her maintain her aspirin and losartan.  Meds ordered this encounter  Medications  . Tdap (BOOSTRIX) 5-2.5-18.5 LF-MCG/0.5 injection    Sig: Inject 0.5 mLs into the muscle once.    Dispense:  0.5 mL    Refill:  0    There are no discontinued medications.  Follow-up: Return in about 6 months (around 12/06/2017).   Crecencio Mc, MD

## 2017-06-06 LAB — IRON,TIBC AND FERRITIN PANEL
%SAT: 36 % (ref 11–50)
FERRITIN: 117 ng/mL (ref 20–288)
Iron: 133 ug/dL (ref 45–160)
TIBC: 373 ug/dL (ref 250–450)

## 2017-06-07 ENCOUNTER — Other Ambulatory Visit: Payer: Self-pay | Admitting: Internal Medicine

## 2017-06-07 MED ORDER — ROPINIROLE HCL 0.25 MG PO TABS: 0.2500 mg | ORAL_TABLET | Freq: Every day | ORAL | 0 refills | Status: DC

## 2017-06-08 DIAGNOSIS — G2581 Restless legs syndrome: Secondary | ICD-10-CM | POA: Insufficient documentation

## 2017-06-08 DIAGNOSIS — M79671 Pain in right foot: Secondary | ICD-10-CM | POA: Insufficient documentation

## 2017-06-08 NOTE — Assessment & Plan Note (Signed)
She is scheduled for GI follow up

## 2017-06-08 NOTE — Assessment & Plan Note (Signed)
noed on chest angiogram in 2017.  Statin advised but deferred by patient.

## 2017-06-08 NOTE — Assessment & Plan Note (Signed)
Annual comprehensive preventive exam was done as well as an evaluation and management of chronic conditions .  During the course of the visit the patient was educated and counseled about appropriate screening and preventive services including :  diabetes screening, lipid analysis with projected  10 year  risk for CAD , nutrition counseling, breast, cervical and colorectal cancer screening, and recommended immunizations.  Printed recommendations for health maintenance screenings was give 

## 2017-06-08 NOTE — Assessment & Plan Note (Signed)
Current symptoms suggest restless leg syndrome,  Iron stores are normal.  Trial of requip

## 2017-06-08 NOTE — Assessment & Plan Note (Signed)
Suggested by unprovoked episode of vertigo lasting 3 hours in 2017.  MRI/MRA and carotid ultrasouds were  normal for age,  but prior CT of chest noted atherosclerotc placque in thoracic aorta .  continue asa daily,  Advised to consider statin therapy but she has deferred.

## 2017-06-08 NOTE — Assessment & Plan Note (Signed)
Bone Density scores  From 2015 reviewed with patient today she has osteopenia,  Moderate.  Would repeat in 2 years and consider therapy then if there is a significant change. Continue calcium, vitamin d and weight bearing exercise on a regular basis.

## 2017-06-08 NOTE — Assessment & Plan Note (Signed)
Bone spur noted on palin films done today.  Podiatry referral in process.

## 2017-06-08 NOTE — Assessment & Plan Note (Signed)
Mild, intermittent, she has no history of recurrent bacterial infections.  B12 level was < 300 so supplementation recommended.   Lab Results  Component Value Date   VITAMINB12 282 06/19/2016     Lab Results  Component Value Date   WBC 4.6 06/05/2017   HGB 13.0 06/05/2017   HCT 38.6 06/05/2017   MCV 93.6 06/05/2017   PLT 244.0 06/05/2017

## 2017-06-08 NOTE — Assessment & Plan Note (Signed)
Untreated due to fear of statins.  10 yr risk of ASCVG using FRC is 20% .  She has deferred until she can discuss with Dr Rockey Situ   Lab Results  Component Value Date   CHOL 213 (H) 05/10/2017   HDL 55.40 05/10/2017   LDLCALC 144 (H) 05/10/2017   LDLDIRECT 142.0 05/10/2017   TRIG 69.0 05/10/2017   CHOLHDL 4 05/10/2017

## 2017-06-08 NOTE — Assessment & Plan Note (Addendum)
Home readings have been normal and machine has been certified to be accurate.  Renal function and lytes are normal. No changes today; continue losartan 25 mg daily.   Lab Results  Component Value Date   CREATININE 0.72 05/10/2017   Lab Results  Component Value Date   NA 139 05/10/2017   K 4.1 05/10/2017   CL 103 05/10/2017   CO2 28 05/10/2017

## 2017-06-08 NOTE — Assessment & Plan Note (Signed)
Trial of ropiniorole offered,  IDA ruled out.

## 2017-06-11 ENCOUNTER — Other Ambulatory Visit: Payer: Self-pay | Admitting: Internal Medicine

## 2017-06-11 ENCOUNTER — Ambulatory Visit: Payer: PPO | Admitting: Cardiovascular Disease

## 2017-06-11 DIAGNOSIS — M7731 Calcaneal spur, right foot: Secondary | ICD-10-CM

## 2017-06-21 DIAGNOSIS — R143 Flatulence: Secondary | ICD-10-CM | POA: Diagnosis not present

## 2017-06-21 DIAGNOSIS — Z86010 Personal history of colonic polyps: Secondary | ICD-10-CM | POA: Diagnosis not present

## 2017-06-30 NOTE — Progress Notes (Signed)
Cardiology Office Note  Date:  07/01/2017   ID:  Sally Morgan, DOB 02-24-1946, MRN [id number]  PCP:  Crecencio Mc, MD   Chief Complaint  Patient presents with  . other    12 month follow up. Patient denies chest pain and SOB. Meds reviewed verbally with patient.     HPI:  Sally Morgan  is a pleasant 71 year-old woman who has a history of  hyperlipidemia,  vein ablation in on the left  post procedure DVT noted on ultrasound.  started on aspirin,  changed to xarelto twice a day after the thrombus extended, for 2 weeks Dilated aortic root Baseline EKG with diffuse T-wave abnormality  She presents for routine followup of her hyperlipidemia and dilated aortic root  Bp good at home Wants to stay on aspirin, 3 a week.  Worried about  developing a clot in her leg, also feels her blood is thick  We reviewed previous CT scan chest 2015 showing ascending aortic dilatation 3.7 cm Minimal aortic arch plaque, minimal descending aorta plaque, no coronary calcification   also reviewed previous echocardiogram  showing stable aortic root 4.1 cm in 2016 echocardiogram 3.5 cm in 2017 Echocardiogram in 2015 showed aortic root 4.2 cm with ejection fraction 45-50%, mildly dilated left ventricle (possible stress cardiomyopathy)   feels well with no complaints Reports her blood pressure is stable on her current medications She prefers not to be on a cholesterol medication despite aortic plaquing, mild  EKG on today's visit shows normal sinus rhythm with rate 71 bpm, diffuse T-wave abnormality anterior precordial leads, inferior leads, no change from prior study  PMH:   has a past medical history of Arthritis; Diverticulitis, colon; and GERD (gastroesophageal reflux disease).  PSH:    Past Surgical History:  Procedure Laterality Date  . CESAREAN SECTION     x 2     Current Outpatient Prescriptions  Medication Sig Dispense Refill  . aspirin 81 MG tablet Take 81 mg by mouth daily.    Marland Kitchen  losartan (COZAAR) 25 MG tablet take 1 tablet by mouth once daily 30 tablet 0  . rOPINIRole (REQUIP) 0.25 MG tablet Take 1 tablet (0.25 mg total) by mouth at bedtime. 90 tablet 0   No current facility-administered medications for this visit.      Allergies:   Lisinopril   Social History:  The patient  reports that she has never smoked. She has never used smokeless tobacco. She reports that she drinks about 2.4 oz of alcohol per week . She reports that she does not use drugs.   Family History:   family history includes Appendicitis in her father; Breast cancer in her cousin; Cancer (age of onset: 18) in her brother; Early death in her father and mother; Tuberculosis in her mother.    Review of Systems: Review of Systems  Constitutional: Negative.   Respiratory: Negative.   Cardiovascular: Negative.   Gastrointestinal: Negative.   Musculoskeletal: Negative.   Neurological: Negative.   Psychiatric/Behavioral: Negative.   All other systems reviewed and are negative.    PHYSICAL EXAM: VS:  BP (!) 142/82 (BP Location: Left Arm, Patient Position: Sitting, Cuff Size: Normal)   Pulse 71   Ht 5\' 5"  (1.651 m)   Wt 159 lb (72.1 kg)   BMI 26.46 kg/m  , BMI Body mass index is 26.46 kg/m. GEN: Well nourished, well developed, in no acute distress  HEENT: normal  Neck: no JVD, carotid bruits, or masses Cardiac: RRR; no murmurs,  rubs, or gallops,no edema  Respiratory:  clear to auscultation bilaterally, normal work of breathing GI: soft, nontender, nondistended, + BS MS: no deformity or atrophy  Skin: warm and dry, no rash Neuro:  Strength and sensation are intact Psych: euthymic mood, full affect    Recent Labs: 05/10/2017: ALT 17; BUN 14; Creatinine, Ser 0.72; Potassium 4.1; Sodium 139; TSH 1.23 06/05/2017: Hemoglobin 13.0; Platelets 244.0    Lipid Panel Lab Results  Component Value Date   CHOL 213 (H) 05/10/2017   HDL 55.40 05/10/2017   LDLCALC 144 (H) 05/10/2017   TRIG 69.0  05/10/2017      Wt Readings from Last 3 Encounters:  07/01/17 159 lb (72.1 kg)  06/05/17 157 lb 9.6 oz (71.5 kg)  04/08/17 155 lb 1.9 oz (70.4 kg)       ASSESSMENT AND PLAN:  Deep venous thrombosis of femoral vein with thrombophlebitis, unspecified laterality (HCC) Denies any further thrombosis in her legs Prefers to stay on aspirin 2 or 3 times per week Discussed the downside of aspirin including GI bleeding  Aortic atherosclerosis (HCC) Minimal plaque noted on CT scan Likely okay to not be on a statin, recommended she continue last modification  Mixed hyperlipidemia Discussed with her, she will continue to monitor diet, exercise  Dilated aortic root ([place]) Discussed with her in detail, images reviewed CT scan, echocardiograms She prefers to have repeat echocardiogram beginning of 2019  Congestive dilated cardiomyopathy ([place]) - Plan: EKG 12-Lead Appears euvolemic, no further workup needed  Disposition:   F/U  12 months   Total encounter time more than 25 minutes  Greater than 50% was spent in counseling and coordination of care with the patient    Orders Placed This Encounter  Procedures  . EKG 12-Lead     Signed, Esmond Plants, M.D., Ph.D. 07/01/2017  [place], [place]

## 2017-07-01 ENCOUNTER — Ambulatory Visit (INDEPENDENT_AMBULATORY_CARE_PROVIDER_SITE_OTHER): Payer: PPO | Admitting: Cardiovascular Disease

## 2017-07-01 ENCOUNTER — Encounter: Payer: Self-pay | Admitting: Cardiovascular Disease

## 2017-07-01 VITALS — BP 142/82 | HR 71 | Ht 65.0 in | Wt 159.0 lb

## 2017-07-01 DIAGNOSIS — I420 Dilated cardiomyopathy: Secondary | ICD-10-CM

## 2017-07-01 DIAGNOSIS — I700 Atherosclerosis of aorta: Secondary | ICD-10-CM | POA: Diagnosis not present

## 2017-07-01 DIAGNOSIS — I77810 Thoracic aortic ectasia: Secondary | ICD-10-CM | POA: Diagnosis not present

## 2017-07-01 DIAGNOSIS — E782 Mixed hyperlipidemia: Secondary | ICD-10-CM

## 2017-07-01 DIAGNOSIS — I82419 Acute embolism and thrombosis of unspecified femoral vein: Secondary | ICD-10-CM | POA: Diagnosis not present

## 2017-07-01 NOTE — Patient Instructions (Addendum)
Medication Instructions:   No medication changes made  Labwork:  No new labs needed  Testing/Procedures:  Echo in January 2019 for dilated aorta, root and ascending aorta   Follow-Up: It was a pleasure seeing you in the office today. Please call us if you have new issues that need to be addressed before your next appt.  [phone]  Your physician wants you to follow-up in: 12 months.  You will receive a reminder letter in the mail two months in advance. If you don't receive a letter, please call our office to schedule the follow-up appointment.  If you need a refill on your cardiac medications before your next appointment, please call your pharmacy.    Echocardiogram An echocardiogram, or echocardiography, uses sound waves (ultrasound) to produce an image of your heart. The echocardiogram is simple, painless, obtained within a short period of time, and offers valuable information to your health care provider. The images from an echocardiogram can provide information such as:  Evidence of coronary artery disease (CAD).  Heart size.  Heart muscle function.  Heart valve function.  Aneurysm detection.  Evidence of a past heart attack.  Fluid buildup around the heart.  Heart muscle thickening.  Assess heart valve function.  Tell a health care provider about:  Any allergies you have.  All medicines you are taking, including vitamins, herbs, eye drops, creams, and over-the-counter medicines.  Any problems you or family members have had with anesthetic medicines.  Any blood disorders you have.  Any surgeries you have had.  Any medical conditions you have.  Whether you are pregnant or may be pregnant. What happens before the procedure? No special preparation is needed. Eat and drink normally. What happens during the procedure?  In order to produce an image of your heart, gel will be applied to your chest and a wand-like tool (transducer) will be moved over  your chest. The gel will help transmit the sound waves from the transducer. The sound waves will harmlessly bounce off your heart to allow the heart images to be captured in real-time motion. These images will then be recorded.  You may need an IV to receive a medicine that improves the quality of the pictures. What happens after the procedure? You may return to your normal schedule including diet, activities, and medicines, unless your health care provider tells you otherwise. This information is not intended to replace advice given to you by your health care provider. Make sure you discuss any questions you have with your health care provider. Document Released: 09/28/2000 Document Revised: 05/19/2016 Document Reviewed: 06/08/2013 Elsevier Interactive Patient Education  2017 [employer].

## 2017-07-02 ENCOUNTER — Ambulatory Visit (INDEPENDENT_AMBULATORY_CARE_PROVIDER_SITE_OTHER): Payer: PPO | Admitting: Podiatry

## 2017-07-02 DIAGNOSIS — M722 Plantar fascial fibromatosis: Secondary | ICD-10-CM | POA: Diagnosis not present

## 2017-07-02 MED ORDER — METHYLPREDNISOLONE 4 MG PO TABS: ORAL_TABLET | ORAL | 0 refills | Status: DC

## 2017-07-02 MED ORDER — DICLOFENAC SODIUM 75 MG PO TBEC: 75.0000 mg | DELAYED_RELEASE_TABLET | Freq: Two times a day (BID) | ORAL | 1 refills | Status: DC

## 2017-07-02 NOTE — Progress Notes (Signed)
   Subjective:    Patient ID: Sally Morgan, female    DOB: September 20, 1946, 71 y.o.   MRN: [id number]  HPI    Review of Systems     Objective:   Physical Exam        Assessment & Plan:

## 2017-07-03 ENCOUNTER — Ambulatory Visit: Payer: PPO | Admitting: Orthotics

## 2017-07-03 DIAGNOSIS — M722 Plantar fascial fibromatosis: Secondary | ICD-10-CM

## 2017-07-03 DIAGNOSIS — Q667 Congenital pes cavus, unspecified foot: Secondary | ICD-10-CM

## 2017-07-03 NOTE — Progress Notes (Signed)
Patient came into today to be cast for Custom Foot Orthotics. Upon recommendation of Dr. 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

## 2017-07-05 NOTE — Progress Notes (Signed)
   Subjective: Patient is a 71 year old female presenting today as a new patient for sharp pain and tenderness in the right heel that has been present for the past 4-5 months. Patient states that it hurts in the mornings with the first steps out of bed and after standing for long periods of time. Walking also increases her pain. She has used ice therapy, stretching and has taken ibuprofen for treatment. Patient presents today for further treatment and evaluation.  Past Medical History:  Diagnosis Date  . Arthritis   . Diverticulitis, colon   . GERD (gastroesophageal reflux disease)      Objective: Physical Exam General: The patient is alert and oriented x3 in no acute distress.  Dermatology: Skin is warm, dry and supple bilateral lower extremities. Negative for open lesions or macerations bilateral.   Vascular: Dorsalis Pedis and Posterior Tibial pulses palpable bilateral.  Capillary fill time is immediate to all digits.  Neurological: Epicritic and protective threshold intact bilateral.   Musculoskeletal: Tenderness to palpation at the medial calcaneal tubercale and through the insertion of the plantar fascia of the right foot. All other joints range of motion within normal limits bilateral. Strength 5/5 in all groups bilateral.     Assessment: 1. Plantar fasciitis right 2. Pain in right foot  Plan of Care:  1. Patient evaluated. Xrays in Epic from 06/05/17 reviewed.   2. Injection of 0.5cc Celestone soluspan injected into the right plantar fascia  3. Rx for Medrol Dose pack placed 4. Rx for Diclofenac 75mg  PO BID ordered for patient. 4. Plantar fascial band(s) dispensed 5. Instructed patient regarding therapies and modalities at home to alleviate symptoms.  6. Appointment with Liliane Channel for custom molded orthotics. 7. Return to clinic in 4 weeks.     Edrick Kins, DPM [facility]  Dr. Edrick Kins, DPM    [address], [place] [address]                Office [phone]  Fax [phone]

## 2017-07-09 MED ORDER — BETAMETHASONE SOD PHOS & ACET 6 (3-3) MG/ML IJ SUSP: 3.0000 mg | Freq: Once | INTRAMUSCULAR | Status: AC

## 2017-07-10 NOTE — Telephone Encounter (Signed)
error 

## 2017-07-18 ENCOUNTER — Telehealth: Payer: Self-pay | Admitting: Podiatry

## 2017-07-18 NOTE — Telephone Encounter (Signed)
Patient was prescribed Diclofenac 75 mlg at last visit. Patient took for only 2 days and was experiencing blood in her urine so she quit taking. Wants to know if there is anything else that you can give her that wont be so hard on her urinary tract?

## 2017-07-19 MED ORDER — NABUMETONE 750 MG PO TABS: 750.0000 mg | ORAL_TABLET | Freq: Every day | ORAL | 0 refills | Status: DC

## 2017-07-19 NOTE — Addendum Note (Signed)
Addended by: Graceann Congress D on: 07/19/2017 05:10 PM   Modules accepted: Orders

## 2017-07-19 NOTE — Telephone Encounter (Signed)
Discontinue diclofenac. Please prescribe Nabumetone 750mg  #60 1 refill. Thanks, Dr. Amalia Hailey

## 2017-07-19 NOTE — Telephone Encounter (Signed)
Patient has been notified of medication change.  rx has been sent to pharmacy

## 2017-07-30 ENCOUNTER — Ambulatory Visit (INDEPENDENT_AMBULATORY_CARE_PROVIDER_SITE_OTHER): Payer: PPO | Admitting: Podiatry

## 2017-07-30 DIAGNOSIS — M722 Plantar fascial fibromatosis: Secondary | ICD-10-CM | POA: Diagnosis not present

## 2017-07-31 ENCOUNTER — Ambulatory Visit: Payer: PPO | Admitting: Orthotics

## 2017-07-31 DIAGNOSIS — Q667 Congenital pes cavus, unspecified foot: Secondary | ICD-10-CM

## 2017-07-31 DIAGNOSIS — M722 Plantar fascial fibromatosis: Secondary | ICD-10-CM

## 2017-07-31 NOTE — Progress Notes (Signed)
   Subjective: Patient is a 71 year old female presenting today for follow-up evaluation of plantar fasciitis of the right foot. She states her pain has improved but is not completely alleviated yet. She states the pain is worse in the morning when she wakes up. She has been wearing the fascial brace which seems to be helping the pain. Patient presents today for further treatment and evaluation.   Past Medical History:  Diagnosis Date  . Arthritis   . Diverticulitis, colon   . GERD (gastroesophageal reflux disease)      Objective: Physical Exam General: The patient is alert and oriented x3 in no acute distress.  Dermatology: Skin is warm, dry and supple bilateral lower extremities. Negative for open lesions or macerations bilateral.   Vascular: Dorsalis Pedis and Posterior Tibial pulses palpable bilateral.  Capillary fill time is immediate to all digits.  Neurological: Epicritic and protective threshold intact bilateral.   Musculoskeletal: Tenderness to palpation at the medial calcaneal tubercale and through the insertion of the plantar fascia of the right foot. All other joints range of motion within normal limits bilateral. Strength 5/5 in all groups bilateral.     Assessment: 1. Plantar fasciitis right 2. Pain in right foot  Plan of Care:  1. Patient evaluated. 2. Injection of 0.5cc Celestone soluspan injected into the right plantar fascia  3. Continue wearing fascial brace.  4. Continue taking meloxicam 15 mg when necessary. 5. Orthotics will be dispensed tomorrow, 07/31/17. 6. Return to clinic in 4 weeks.   Edrick Kins, DPM [facility]  Dr. Edrick Kins, DPM    [address], [place] [address]                Office [phone]  Fax [phone]

## 2017-07-31 NOTE — Progress Notes (Signed)
Going to send f/o back to [facility]..hug arch more and extend forefoot.

## 2017-08-08 MED ORDER — BETAMETHASONE SOD PHOS & ACET 6 (3-3) MG/ML IJ SUSP: 3.0000 mg | Freq: Once | INTRAMUSCULAR | Status: AC

## 2017-08-14 ENCOUNTER — Ambulatory Visit: Payer: PPO | Admitting: Orthotics

## 2017-08-14 DIAGNOSIS — M722 Plantar fascial fibromatosis: Secondary | ICD-10-CM

## 2017-08-14 DIAGNOSIS — Q667 Congenital pes cavus, unspecified foot: Secondary | ICD-10-CM

## 2017-08-14 NOTE — Progress Notes (Signed)
Patients f/o sent back to [facility] to cut more narrow, extend to full, hug arch more, and heel punch

## 2017-08-19 ENCOUNTER — Other Ambulatory Visit: Payer: Self-pay | Admitting: Cardiovascular Disease

## 2017-08-21 ENCOUNTER — Encounter: Payer: PPO | Admitting: Orthotics

## 2017-08-28 ENCOUNTER — Ambulatory Visit: Payer: PPO | Admitting: Orthotics

## 2017-08-28 DIAGNOSIS — M722 Plantar fascial fibromatosis: Secondary | ICD-10-CM

## 2017-08-28 DIAGNOSIS — Q667 Congenital pes cavus, unspecified foot: Secondary | ICD-10-CM

## 2017-08-28 NOTE — Progress Notes (Signed)
Patient came in to pick up modified f/o.  She is pleased thus far with the increased arch height; but may come back and get more aggressive if needed for comfort.

## 2017-08-30 ENCOUNTER — Ambulatory Visit: Payer: PPO | Admitting: Podiatry

## 2017-09-13 ENCOUNTER — Ambulatory Visit: Payer: PPO | Admitting: Podiatry

## 2017-09-17 ENCOUNTER — Encounter: Payer: Self-pay | Admitting: *Deleted

## 2017-09-18 ENCOUNTER — Encounter
Admission: RE | Disposition: A | Discharge status: Home / Self Care | Payer: Self-pay | Source: Ambulatory Visit | Attending: Unknown Physician Specialty

## 2017-09-18 ENCOUNTER — Ambulatory Visit
Admission: RE | Admit: 2017-09-18 | Discharge: 2017-09-18 | Disposition: A | Discharge status: Home / Self Care | Payer: PPO | Source: Ambulatory Visit | Attending: Unknown Physician Specialty | Admitting: Unknown Physician Specialty

## 2017-09-18 ENCOUNTER — Ambulatory Visit: Payer: PPO | Admitting: Anesthesiology

## 2017-09-18 ENCOUNTER — Encounter: Payer: Self-pay | Admitting: Anesthesiology

## 2017-09-18 DIAGNOSIS — Z800 Family history of malignant neoplasm of digestive organs: Secondary | ICD-10-CM | POA: Insufficient documentation

## 2017-09-18 DIAGNOSIS — Z7952 Long term (current) use of systemic steroids: Secondary | ICD-10-CM | POA: Insufficient documentation

## 2017-09-18 DIAGNOSIS — K644 Residual hemorrhoidal skin tags: Secondary | ICD-10-CM | POA: Diagnosis not present

## 2017-09-18 DIAGNOSIS — K5730 Diverticulosis of large intestine without perforation or abscess without bleeding: Secondary | ICD-10-CM | POA: Diagnosis not present

## 2017-09-18 DIAGNOSIS — I420 Dilated cardiomyopathy: Secondary | ICD-10-CM | POA: Insufficient documentation

## 2017-09-18 DIAGNOSIS — Z79899 Other long term (current) drug therapy: Secondary | ICD-10-CM | POA: Insufficient documentation

## 2017-09-18 DIAGNOSIS — I10 Essential (primary) hypertension: Secondary | ICD-10-CM | POA: Insufficient documentation

## 2017-09-18 DIAGNOSIS — Z8673 Personal history of transient ischemic attack (TIA), and cerebral infarction without residual deficits: Secondary | ICD-10-CM | POA: Diagnosis not present

## 2017-09-18 DIAGNOSIS — Z7982 Long term (current) use of aspirin: Secondary | ICD-10-CM | POA: Diagnosis not present

## 2017-09-18 DIAGNOSIS — I739 Peripheral vascular disease, unspecified: Secondary | ICD-10-CM | POA: Diagnosis not present

## 2017-09-18 DIAGNOSIS — Z1211 Encounter for screening for malignant neoplasm of colon: Secondary | ICD-10-CM | POA: Diagnosis not present

## 2017-09-18 DIAGNOSIS — Z86010 Personal history of colonic polyps: Secondary | ICD-10-CM | POA: Diagnosis not present

## 2017-09-18 DIAGNOSIS — K648 Other hemorrhoids: Secondary | ICD-10-CM | POA: Diagnosis not present

## 2017-09-18 DIAGNOSIS — K5790 Diverticulosis of intestine, part unspecified, without perforation or abscess without bleeding: Secondary | ICD-10-CM | POA: Diagnosis not present

## 2017-09-18 DIAGNOSIS — Z791 Long term (current) use of non-steroidal anti-inflammatories (NSAID): Secondary | ICD-10-CM | POA: Insufficient documentation

## 2017-09-18 DIAGNOSIS — I700 Atherosclerosis of aorta: Secondary | ICD-10-CM | POA: Insufficient documentation

## 2017-09-18 DIAGNOSIS — K640 First degree hemorrhoids: Secondary | ICD-10-CM | POA: Diagnosis not present

## 2017-09-18 HISTORY — DX: Transient cerebral ischemic attack, unspecified: G45.9

## 2017-09-18 HISTORY — PX: COLONOSCOPY WITH PROPOFOL: SHX5780

## 2017-09-18 HISTORY — DX: Atherosclerosis of aorta: I70.0

## 2017-09-18 HISTORY — DX: Dilated cardiomyopathy: I42.0

## 2017-09-18 LAB — HM COLONOSCOPY

## 2017-09-18 SURGERY — COLONOSCOPY WITH PROPOFOL
Anesthesia: General

## 2017-09-18 MED ORDER — PROPOFOL 500 MG/50ML IV EMUL
INTRAVENOUS | Status: AC
  Filled 2017-09-18: qty 50

## 2017-09-18 MED ORDER — PROPOFOL 10 MG/ML IV BOLUS
INTRAVENOUS | Status: DC | PRN
  Administered 2017-09-18: 100 mg via INTRAVENOUS

## 2017-09-18 MED ORDER — SODIUM CHLORIDE 0.9 % IV SOLN
INTRAVENOUS | Status: DC
  Administered 2017-09-18: 1000 mL via INTRAVENOUS
  Administered 2017-09-18: 10:00:00 via INTRAVENOUS

## 2017-09-18 MED ORDER — FENTANYL CITRATE (PF) 100 MCG/2ML IJ SOLN
INTRAMUSCULAR | Status: AC
  Filled 2017-09-18: qty 2

## 2017-09-18 MED ORDER — SODIUM CHLORIDE 0.9 % IV SOLN: INTRAVENOUS | Status: DC

## 2017-09-18 MED ORDER — LIDOCAINE 2% (20 MG/ML) 5 ML SYRINGE
INTRAMUSCULAR | Status: DC | PRN
  Administered 2017-09-18: 40 mg via INTRAVENOUS

## 2017-09-18 MED ORDER — PROPOFOL 500 MG/50ML IV EMUL
INTRAVENOUS | Status: DC | PRN
  Administered 2017-09-18: 150 ug/kg/min via INTRAVENOUS

## 2017-09-18 MED ORDER — FENTANYL CITRATE (PF) 100 MCG/2ML IJ SOLN
INTRAMUSCULAR | Status: DC | PRN
  Administered 2017-09-18 (×2): 50 ug via INTRAVENOUS

## 2017-09-18 MED ORDER — PHENYLEPHRINE HCL 10 MG/ML IJ SOLN
INTRAMUSCULAR | Status: DC | PRN
  Administered 2017-09-18: 100 ug via INTRAVENOUS

## 2017-09-18 NOTE — Op Note (Signed)
[facility] Gastroenterology Patient Name: Sally Morgan Procedure Date: 09/18/2017 9:38 AM MRN: [id number] Account #: [id number] Date of Birth: Feb 16, 1946 Admit Type: Outpatient Age: 71 Room: [facility] ENDO ROOM 3 Gender: Female Note Status: Finalized Procedure:            Colonoscopy Indications:          High risk colon cancer surveillance: Personal history                        of colonic polyps Providers:            Manya Silvas, MD Referring MD:         Deborra Medina, MD (Referring MD) Medicines:            Propofol per Anesthesia Complications:        No immediate complications. Procedure:            Pre-Anesthesia Assessment:                       - After reviewing the risks and benefits, the patient                        was deemed in satisfactory condition to undergo the                        procedure.                       After obtaining informed consent, the colonoscope was                        passed under direct vision. Throughout the procedure,                        the patient's blood pressure, pulse, and oxygen                        saturations were monitored continuously. The Olympus                        PCF-H180AL colonoscope ( S#: [id number] ) was introduced                        through the anus and advanced to the the cecum,                        identified by appendiceal orifice and ileocecal valve.                        The colonoscopy was performed without difficulty. The                        patient tolerated the procedure well. The quality of                        the bowel preparation was excellent. Findings:      Many small-mouthed diverticula were found in the sigmoid colon.      External hemorrhoids were found during endoscopy. The hemorrhoids were       small and Grade I (internal hemorrhoids that do not prolapse).  The exam was otherwise without abnormality. Impression:           - Diverticulosis in the sigmoid  colon.                       - External hemorrhoids.                       - The examination was otherwise normal.                       - No specimens collected. Recommendation:       - Repeat colonoscopy in 5 years for surveillance. Manya Silvas, MD 09/18/2017 10:02:40 AM This report has been signed electronically. Number of Addenda: 0 Note Initiated On: 09/18/2017 9:38 AM Scope Withdrawal Time: 0 hours 9 minutes 36 seconds  Total Procedure Duration: 0 hours 14 minutes 19 seconds       [facility]

## 2017-09-18 NOTE — Anesthesia Preprocedure Evaluation (Signed)
Anesthesia Evaluation  Patient identified by MRN, date of birth, ID band Patient awake    Reviewed: Allergy & Precautions, H&P , NPO status , Patient's Chart, lab work & pertinent test results, reviewed documented beta blocker date and time   History of Anesthesia Complications Negative for: history of anesthetic complications  Airway Mallampati: I  TM Distance: >3 FB Neck ROM: full    Dental  (+) Dental Advidsory Given, Caps, Teeth Intact   Pulmonary neg pulmonary ROS,           Cardiovascular Exercise Tolerance: Good hypertension, + Peripheral Vascular Disease  (-) CAD, (-) Past MI, (-) Cardiac Stents and (-) CABG (-) dysrhythmias (-) Valvular Problems/Murmurs     Neuro/Psych neg Seizures TIA Neuromuscular disease negative psych ROS   GI/Hepatic Neg liver ROS, GERD  ,  Endo/Other  negative endocrine ROS  Renal/GU negative Renal ROS  negative genitourinary   Musculoskeletal   Abdominal   Peds  Hematology negative hematology ROS (+)   Anesthesia Other Findings Past Medical History: No date: Aortic atherosclerosis (HCC) No date: Arthritis No date: Dilated congestive cardiomyopathy (HCC) No date: Diverticulitis, colon No date: GERD (gastroesophageal reflux disease) No date: TIA (transient ischemic attack)   Reproductive/Obstetrics negative OB ROS                             Anesthesia Physical Anesthesia Plan  ASA: II  Anesthesia Plan: General   Post-op Pain Management:    Induction: Intravenous  PONV Risk Score and Plan: 3 and Propofol infusion  Airway Management Planned: Nasal Cannula  Additional Equipment:   Intra-op Plan:   Post-operative Plan:   Informed Consent: I have reviewed the patients History and Physical, chart, labs and discussed the procedure including the risks, benefits and alternatives for the proposed anesthesia with the patient or authorized  representative who has indicated his/her understanding and acceptance.   Dental Advisory Given  Plan Discussed with: Anesthesiologist, CRNA and Surgeon  Anesthesia Plan Comments:         Anesthesia Quick Evaluation

## 2017-09-18 NOTE — Anesthesia Post-op Follow-up Note (Signed)
Anesthesia QCDR form completed.        

## 2017-09-18 NOTE — Transfer of Care (Signed)
Immediate Anesthesia Transfer of Care Note  Patient: Sally Morgan  Procedure(s) Performed: COLONOSCOPY WITH PROPOFOL (N/A )  Patient Location: PACU and Endoscopy Unit  Anesthesia Type:General  Level of Consciousness: sedated  Airway & Oxygen Therapy: Patient Spontanous Breathing and Patient connected to nasal cannula oxygen  Post-op Assessment: Report given to RN and Post -op Vital signs reviewed and stable  Post vital signs: Reviewed and stable  Last Vitals:  Vitals:   09/18/17 0815  BP: 137/77  Pulse: 77  Resp: 16  Temp: (!) 36.1 C  SpO2: 100%    Last Pain:  Vitals:   09/18/17 0815  TempSrc: Tympanic         Complications: No apparent anesthesia complications

## 2017-09-18 NOTE — H&P (Signed)
Primary Care Physician:  Crecencio Mc, MD Primary Gastroenterologist:  Dr. Vira Agar  Pre-Procedure History & Physical: HPI:  Sally Morgan is a 71 y.o. female is here for an colonoscopy.   Past Medical History:  Diagnosis Date  . Aortic atherosclerosis ([place])   . Arthritis   . Dilated congestive cardiomyopathy ([place])   . Diverticulitis, colon   . GERD (gastroesophageal reflux disease)   . TIA (transient ischemic attack)     Past Surgical History:  Procedure Laterality Date  . CESAREAN SECTION     x 2   . colonoscopy with polypectomy      Prior to Admission medications   Medication Sig Start Date End Date Taking? Authorizing Provider  aspirin 81 MG tablet Take 81 mg by mouth daily.   Yes [provider]  diclofenac (VOLTAREN) 75 MG EC tablet Take 1 tablet (75 mg total) by mouth 2 (two) times daily. 07/02/17  Yes Edrick Kins, DPM  ibuprofen (ADVIL,MOTRIN) 200 MG tablet Take by mouth.   Yes [provider]  losartan (COZAAR) 25 MG tablet take 1 tablet by mouth once daily 04/16/17  Yes Gollan, Kathlene November, MD  losartan (COZAAR) 25 MG tablet take 1 tablet by mouth once daily 08/19/17  Yes Gollan, Kathlene November, MD  methylPREDNISolone (MEDROL) 4 MG tablet Take as directed 07/02/17  Yes Edrick Kins, DPM  nabumetone (RELAFEN) 750 MG tablet Take 1 tablet (750 mg total) by mouth daily. 07/19/17  Yes Edrick Kins, DPM  rOPINIRole (REQUIP) 0.25 MG tablet Take 1 tablet (0.25 mg total) by mouth at bedtime. 06/07/17  Yes Crecencio Mc, MD    Allergies as of 06/22/2017 - Review Complete 06/05/2017  Allergen Reaction Noted  . Lisinopril Cough 12/08/2015    Family History  Problem Relation Age of Onset  . Cancer Brother 71       colon ca  . Early death Mother   . Tuberculosis Mother   . Early death Father   . Appendicitis Father   . Breast cancer Cousin        paternal    Social History   Socioeconomic History  . Marital status: Widowed    Spouse name: Not  on file  . Number of children: Not on file  . Years of education: Not on file  . Highest education level: Not on file  Social Needs  . Financial resource strain: Not on file  . Food insecurity - worry: Not on file  . Food insecurity - inability: Not on file  . Transportation needs - medical: Not on file  . Transportation needs - non-medical: Not on file  Occupational History  . Not on file  Tobacco Use  . Smoking status: Never Smoker  . Smokeless tobacco: Never Used  Substance and Sexual Activity  . Alcohol use: Yes    Alcohol/week: 2.4 oz    Types: 1 Glasses of wine, 3 Standard drinks or equivalent per week    Comment: OCC  . Drug use: No  . Sexual activity: Not Currently  Other Topics Concern  . Not on file  Social History Narrative  . Not on file    Review of Systems: See HPI, otherwise negative ROS  Physical Exam: BP 137/77   Pulse 77   Temp (!) 97 F (36.1 C) (Tympanic)   Resp 16   Ht 5\' 5"  (1.651 m)   Wt 70.3 kg (155 lb)   SpO2 100%   BMI 25.79 kg/m  General:   Alert,  pleasant and cooperative in NAD Head:  Normocephalic and atraumatic. Neck:  Supple; no masses or thyromegaly. Lungs:  Clear throughout to auscultation.    Heart:  Regular rate and rhythm. Abdomen:  Soft, nontender and nondistended. Normal bowel sounds, without guarding, and without rebound.   Neurologic:  Alert and  oriented x4;  grossly normal neurologically.  Impression/Plan: Sally Morgan is here for an colonoscopy to be performed for [facility] colon polyps and family history of colon cancer in a brother.  Risks, benefits, limitations, and alternatives regarding  colonoscopy have been reviewed with the patient.  Questions have been answered.  All parties agreeable.   Gaylyn Cheers, MD  09/18/2017, 9:37 AM

## 2017-09-18 NOTE — Anesthesia Postprocedure Evaluation (Signed)
Anesthesia Post Note  Patient: Sally Morgan  Procedure(s) Performed: COLONOSCOPY WITH PROPOFOL (N/A )  Patient location during evaluation: Endoscopy Anesthesia Type: General Level of consciousness: awake and alert Pain management: pain level controlled Vital Signs Assessment: post-procedure vital signs reviewed and stable Respiratory status: spontaneous breathing, nonlabored ventilation, respiratory function stable and patient connected to nasal cannula oxygen Cardiovascular status: blood pressure returned to baseline and stable Postop Assessment: no apparent nausea or vomiting Anesthetic complications: no     Last Vitals:  Vitals:   09/18/17 1020 09/18/17 1030  BP: 125/80 125/77  Pulse: 66 83  Resp: 15 (!) 21  Temp:    SpO2: 100% 100%    Last Pain:  Vitals:   09/18/17 1000  TempSrc: Tympanic                 Martha Clan

## 2017-09-19 ENCOUNTER — Encounter: Payer: Self-pay | Admitting: Unknown Physician Specialty

## 2017-09-20 ENCOUNTER — Ambulatory Visit: Payer: PPO | Admitting: Podiatry

## 2017-10-02 ENCOUNTER — Encounter: Payer: Self-pay | Admitting: Internal Medicine

## 2017-10-29 ENCOUNTER — Ambulatory Visit: Payer: PPO | Admitting: Podiatry

## 2017-10-29 ENCOUNTER — Encounter: Payer: Self-pay | Admitting: Podiatry

## 2017-10-29 DIAGNOSIS — M722 Plantar fascial fibromatosis: Secondary | ICD-10-CM

## 2017-10-31 NOTE — Progress Notes (Signed)
   Subjective: Patient is a 72 year old female presenting today for follow-up evaluation of plantar fasciitis of the right foot. She reports improvement in the pain of the right foot but states it is still present. Wearing the inserts helps alleviate the pain but the injections did not provide any relief. She states the Meloxicam caused internal bleeding. Patient presents today for further treatment and evaluation.   Past Medical History:  Diagnosis Date  . Aortic atherosclerosis ([place])   . Arthritis   . Dilated congestive cardiomyopathy ([place])   . Diverticulitis, colon   . GERD (gastroesophageal reflux disease)   . TIA (transient ischemic attack)      Objective: Physical Exam General: The patient is alert and oriented x3 in no acute distress.  Dermatology: Skin is warm, dry and supple bilateral lower extremities. Negative for open lesions or macerations bilateral.   Vascular: Dorsalis Pedis and Posterior Tibial pulses palpable bilateral.  Capillary fill time is immediate to all digits.  Neurological: Epicritic and protective threshold intact bilateral.   Musculoskeletal: Tenderness to palpation at the medial calcaneal tubercale and through the insertion of the plantar fascia of the right foot. All other joints range of motion within normal limits bilateral. Strength 5/5 in all groups bilateral.     Assessment: 1. Plantar fasciitis right 2. Pain in right foot  Plan of Care:  1. Patient evaluated. 2. Appointment with Liliane Channel for custom molded orthotics modification to adjust pressure on the heel. 3. Discontinue taking Meloxicam. 4. Prescription for antiinflammatory pain cream to be dispensed from [employer]. 5. Continue wearing good sneakers. 6. Return to clinic as needed.    Edrick Kins, DPM [facility]  Dr. Edrick Kins, DPM    [address], [place] [address]                Office [phone]    Fax [phone]

## 2017-11-01 MED ORDER — NONFORMULARY OR COMPOUNDED ITEM: 2 refills | Status: DC

## 2017-11-20 ENCOUNTER — Other Ambulatory Visit: Payer: Self-pay | Admitting: Cardiovascular Disease

## 2017-11-20 ENCOUNTER — Other Ambulatory Visit: Payer: PPO | Admitting: Orthotics

## 2017-11-20 DIAGNOSIS — I420 Dilated cardiomyopathy: Secondary | ICD-10-CM

## 2017-11-20 DIAGNOSIS — I77810 Thoracic aortic ectasia: Secondary | ICD-10-CM

## 2017-11-21 ENCOUNTER — Other Ambulatory Visit: Payer: Self-pay

## 2017-11-21 ENCOUNTER — Ambulatory Visit (INDEPENDENT_AMBULATORY_CARE_PROVIDER_SITE_OTHER): Payer: PPO

## 2017-11-21 DIAGNOSIS — I420 Dilated cardiomyopathy: Secondary | ICD-10-CM | POA: Diagnosis not present

## 2017-11-21 DIAGNOSIS — I77810 Thoracic aortic ectasia: Secondary | ICD-10-CM | POA: Diagnosis not present

## 2017-11-27 ENCOUNTER — Telehealth: Payer: Self-pay | Admitting: Cardiovascular Disease

## 2017-11-27 NOTE — Telephone Encounter (Signed)
Please call with echocardiogram results.  

## 2017-11-27 NOTE — Telephone Encounter (Signed)
S/w patient and let her know that once Dr Rockey Situ reviews the results we will let her know. She verbalized understanding.

## 2018-04-09 ENCOUNTER — Ambulatory Visit (INDEPENDENT_AMBULATORY_CARE_PROVIDER_SITE_OTHER): Payer: PPO

## 2018-04-09 VITALS — BP 138/72 | HR 78 | Temp 98.0°F | Resp 14 | Ht 65.0 in | Wt 156.8 lb

## 2018-04-09 DIAGNOSIS — Z0000 Encounter for general adult medical examination without abnormal findings: Secondary | ICD-10-CM

## 2018-04-09 NOTE — Patient Instructions (Addendum)
  Sally Morgan , Thank you for taking time to come for your Medicare Wellness Visit. I appreciate your ongoing commitment to your health goals. Please review the following plan we discussed and let me know if I can assist you in the future.   Follow up as needed.    Bring a copy of your [place] and/or Living Will to be scanned into chart.  Have a great day!  These are the goals we discussed: Goals    . Maintain Healthy Lifestyle     Stay active, walk as tolerated for exercise Stay hydrated Healthy diet       This is a list of the screening recommended for you and due dates:  Health Maintenance  Topic Date Due  . Tetanus Vaccine  02/03/2013  . Flu Shot  05/15/2018  . Mammogram  05/08/2019  . Colon Cancer Screening  09/18/2022  . DEXA scan (bone density measurement)  Completed  .  Hepatitis C: One time screening is recommended by Center for Disease Control  (CDC) for  adults born from [birth year] through 1964.   Completed

## 2018-04-09 NOTE — Progress Notes (Addendum)
Subjective:   Sally Morgan is a 72 y.o. female who presents for Medicare Annual (Subsequent) preventive examination.  Review of Systems:  No ROS.  Medicare Wellness Visit. Additional risk factors are reflected in the social history.  Cardiac Risk Factors include: advanced age (>20men, >43 women)     Objective:     Vitals: BP 138/72 (BP Location: Left Arm, Patient Position: Sitting, Cuff Size: Normal)   Pulse 78   Temp 98 F (36.7 C) (Oral)   Resp 14   Ht 5\' 5"  (1.651 m)   Wt 156 lb 12.8 oz (71.1 kg)   SpO2 95%   BMI 26.09 kg/m   Body mass index is 26.09 kg/m.  Advanced Directives 04/09/2018 09/18/2017 04/08/2017 10/12/2015  Does Patient Have a Medical Advance Directive? Yes Yes Yes Yes  Type of Paramedic of [place];Living will Living will Living will;Healthcare Power of [place];Living will  Does patient want to make changes to medical advance directive? No - Patient declined - No - Patient declined No - Patient declined  Copy of [place] in Chart? No - copy requested - No - copy requested No - copy requested    Tobacco Social History   Tobacco Use  Smoking Status Never Smoker  Smokeless Tobacco Never Used     Counseling given: Not Answered   Clinical Intake:  Pre-visit preparation completed: Yes  Pain : No/denies pain     Diabetes: No  How often do you need to have someone help you when you read instructions, pamphlets, or other written materials from your doctor or pharmacy?: 1 - Never  Interpreter Needed?: No     Past Medical History:  Diagnosis Date  . Aortic atherosclerosis ([place])   . Arthritis   . Dilated congestive cardiomyopathy ([place])   . Diverticulitis, colon   . GERD (gastroesophageal reflux disease)   . TIA (transient ischemic attack)    Past Surgical History:  Procedure Laterality Date  . CESAREAN SECTION     x 2   . colonoscopy with polypectomy    .  COLONOSCOPY WITH PROPOFOL N/A 09/18/2017   Procedure: COLONOSCOPY WITH PROPOFOL;  Surgeon: Manya Silvas, MD;  Location: [facility] ENDOSCOPY;  Service: Endoscopy;  Laterality: N/A;   Family History  Problem Relation Age of Onset  . Cancer Brother 85       colon ca  . Early death Mother   . Tuberculosis Mother   . Early death Father   . Appendicitis Father   . Breast cancer Cousin        paternal   Social History   Socioeconomic History  . Marital status: Widowed    Spouse name: Not on file  . Number of children: Not on file  . Years of education: Not on file  . Highest education level: Not on file  Occupational History  . Not on file  Social Needs  . Financial resource strain: Not hard at all  . Food insecurity:    Worry: Never true    Inability: Never true  . Transportation needs:    Medical: No    Non-medical: No  Tobacco Use  . Smoking status: Never Smoker  . Smokeless tobacco: Never Used  Substance and Sexual Activity  . Alcohol use: Yes    Alcohol/week: 2.4 oz    Types: 1 Glasses of wine, 3 Standard drinks or equivalent per week    Comment: OCC  . Drug use: No  .  Sexual activity: Not Currently  Lifestyle  . Physical activity:    Days per week: 3 days    Minutes per session: 30 min  . Stress: Not at all  Relationships  . Social connections:    Talks on phone: Not on file    Gets together: Not on file    Attends religious service: Not on file    Active member of club or organization: Not on file    Attends meetings of clubs or organizations: Not on file    Relationship status: Not on file  Other Topics Concern  . Not on file  Social History Narrative  . Not on file    Outpatient Encounter Medications as of 04/09/2018  Medication Sig  . Multiple Vitamins-Minerals (WOMENS MULTIVITAMIN PO) Take 1 tablet by mouth.  . Omega-3 Fatty Acids (FISH OIL) 1000 MG CAPS Take 1 capsule by mouth.  Marland Kitchen aspirin 81 MG tablet Take 81 mg by mouth daily.  Marland Kitchen losartan (COZAAR)  25 MG tablet take 1 tablet by mouth once daily  . [DISCONTINUED] diclofenac (VOLTAREN) 75 MG EC tablet Take 1 tablet (75 mg total) by mouth 2 (two) times daily.  . [DISCONTINUED] ibuprofen (ADVIL,MOTRIN) 200 MG tablet Take by mouth.  . [DISCONTINUED] losartan (COZAAR) 25 MG tablet take 1 tablet by mouth once daily  . [DISCONTINUED] methylPREDNISolone (MEDROL) 4 MG tablet Take as directed  . [DISCONTINUED] nabumetone (RELAFEN) 750 MG tablet Take 1 tablet (750 mg total) by mouth daily.  . [DISCONTINUED] NONFORMULARY OR COMPOUNDED ITEM See pharmacy note  . [DISCONTINUED] rOPINIRole (REQUIP) 0.25 MG tablet Take 1 tablet (0.25 mg total) by mouth at bedtime.   Facility-Administered Encounter Medications as of 04/09/2018  Medication  . betamethasone acetate-betamethasone sodium phosphate (CELESTONE) injection 3 mg  . betamethasone acetate-betamethasone sodium phosphate (CELESTONE) injection 3 mg    Activities of Daily Living In your present state of health, do you have any difficulty performing the following activities: 04/09/2018  Hearing? N  Vision? N  Difficulty concentrating or making decisions? N  Walking or climbing stairs? Y  Comment Pain in R groin when walking a great distance or sitting for an extended period. Hx of childhood groin strain.   Dressing or bathing? N  Doing errands, shopping? N  Preparing Food and eating ? N  Using the Toilet? N  In the past six months, have you accidently leaked urine? N  Do you have problems with loss of bowel control? N  Managing your Medications? N  Managing your Finances? N  Housekeeping or managing your Housekeeping? N  Some recent data might be hidden    Patient Care Team: Crecencio Mc, MD as PCP - General (Internal Medicine) Minna Merritts, MD as Consulting Physician (Cardiology)    Assessment:   This is a routine wellness examination for Sally Morgan.  The goal of the wellness visit is to assist the patient how to close the gaps in care  and create a preventative care plan for the patient.   The roster of all physicians providing medical care to patient is listed in the Snapshot section of the chart.  Osteoporosis risk reviewed.    Safety issues reviewed; Smoke and carbon monoxide detectors in the home. No firearms in the home. Wears seatbelts when driving or riding with others. No violence in the home.  They do not have excessive sun exposure.  Discussed the need for sun protection: hats, long sleeves and the use of sunscreen if there is significant sun exposure.  Patient is alert, normal appearance, oriented to person/place/and time. Correctly identified the president of the [place] and recalls of 2/3 words. Performs simple calculations and can read correct time from watch face. Displays appropriate judgement.  No new identified risk were noted.  No failures at ADL's or IADL's.   BMI- discussed the importance of a healthy diet, water intake and the benefits of aerobic exercise. Educational material provided.   24 hour diet recall: Regular diet in moderation  Dental- every 6 months.  Eye- Visual acuity not assessed per patient preference since they have regular follow up with the ophthalmologist.  Wears corrective lenses.  Sleep patterns- Sleeps 5 hours. Wakes feeling rested.    TDAP vaccine deferred per patient preference.  Follow up with insurance.  Educational material provided.  Patient Concerns: None at this time. Follow up with PCP as needed.  Exercise Activities and Dietary recommendations Current Exercise Habits: Home exercise routine, Type of exercise: walking, Time (Minutes): 30, Frequency (Times/Week): 3, Weekly Exercise (Minutes/Week): 90, Intensity: Moderate  Goals    . Maintain Healthy Lifestyle     Stay active, walk as tolerated for exercise Stay hydrated Healthy diet       Fall Risk Fall Risk  04/09/2018 04/08/2017 10/12/2015 11/03/2013  Falls in the past year? No No No No   Depression  Screen PHQ 2/9 Scores 04/09/2018 04/08/2017 10/12/2015 11/03/2013  PHQ - 2 Score 0 0 0 0  PHQ- 9 Score - 0 - -     Cognitive Function MMSE - Mini Mental State Exam 04/09/2018 04/08/2017 10/12/2015  Orientation to time 5 5 5   Orientation to Place 5 5 5   Registration 3 3 3   Attention/ Calculation 5 5 5   Recall 2 3 3   Language- name 2 objects 2 2 2   Language- repeat 1 1 1   Language- follow 3 step command 3 3 3   Language- read & follow direction 1 1 1   Write a sentence 1 1 1   Copy design 1 1 1   Total score 29 30 30         Immunization History  Administered Date(s) Administered  . Influenza Whole 08/06/2010  . Pneumococcal Conjugate-13 11/02/2013  . Pneumococcal Polysaccharide-23 12/08/2015  . Td 02/04/2003   Screening Tests Health Maintenance  Topic Date Due  . TETANUS/TDAP  02/03/2013  . INFLUENZA VACCINE  05/15/2018  . MAMMOGRAM  05/08/2019  . COLONOSCOPY  09/18/2022  . DEXA SCAN  Completed  . Hepatitis C Screening  Completed      Plan:    End of life planning; Advance aging; Advanced directives discussed. Copy of current HCPOA/Living Will requested.    I have personally reviewed and noted the following in the patient's chart:   . Medical and social history . Use of alcohol, tobacco or illicit drugs  . Current medications and supplements . Functional ability and status . Nutritional status . Physical activity . Advanced directives . List of other physicians . Hospitalizations, surgeries, and ER visits in previous 12 months . Vitals . Screenings to include cognitive, depression, and falls . Referrals and appointments  In addition, I have reviewed and discussed with patient certain preventive protocols, quality metrics, and best practice recommendations. A written personalized care plan for preventive services as well as general preventive health recommendations were provided to patient.     OBrien-Blaney, Fredi Hurtado L, LPN  4/54/0981   I have reviewed the above  information and agree with above.   Deborra Medina, MD

## 2018-04-11 ENCOUNTER — Ambulatory Visit (INDEPENDENT_AMBULATORY_CARE_PROVIDER_SITE_OTHER): Payer: PPO | Admitting: Internal Medicine

## 2018-04-11 ENCOUNTER — Ambulatory Visit (INDEPENDENT_AMBULATORY_CARE_PROVIDER_SITE_OTHER): Payer: PPO

## 2018-04-11 ENCOUNTER — Encounter: Payer: Self-pay | Admitting: Internal Medicine

## 2018-04-11 VITALS — BP 132/82 | HR 70 | Temp 97.7°F | Resp 14 | Ht 65.0 in | Wt 157.0 lb

## 2018-04-11 DIAGNOSIS — G8929 Other chronic pain: Secondary | ICD-10-CM

## 2018-04-11 DIAGNOSIS — M25551 Pain in right hip: Secondary | ICD-10-CM | POA: Diagnosis not present

## 2018-04-11 NOTE — Progress Notes (Signed)
Subjective:  Patient ID: Sally Morgan, female    DOB: 07-30-1946  Age: 72 y.o. MRN: [id number]  CC: The primary encounter diagnosis was Pain of right hip joint. A diagnosis of Chronic right hip pain was also pertinent to this visit.  HPI Sally Morgan presents for evaluation of right sided groin pain  That has been present for the past year.  The pain is aggravated by inactivity and improves after ten minutes of activity. It is present daily.  There is no history of fall.  She has a history of remote injury in high school/college during a track meet that was never diagnosed, but she remembers thinking that she tore or strained a hip flexor.   Outpatient Medications Prior to Visit  Medication Sig Dispense Refill  . aspirin 81 MG tablet Take 81 mg by mouth daily.    Marland Kitchen losartan (COZAAR) 25 MG tablet take 1 tablet by mouth once daily 90 tablet 3  . Multiple Vitamins-Minerals (WOMENS MULTIVITAMIN PO) Take 1 tablet by mouth.    . Omega-3 Fatty Acids (FISH OIL) 1000 MG CAPS Take 1 capsule by mouth.     Facility-Administered Medications Prior to Visit  Medication Dose Route Frequency Provider Last Rate Last Dose  . betamethasone acetate-betamethasone sodium phosphate (CELESTONE) injection 3 mg  3 mg Intramuscular Once Daylene Katayama M, DPM      . betamethasone acetate-betamethasone sodium phosphate (CELESTONE) injection 3 mg  3 mg Intramuscular Once Edrick Kins, DPM        Review of Systems;  Patient denies headache, fevers, malaise, unintentional weight loss, skin rash, eye pain, sinus congestion and sinus pain, sore throat, dysphagia,  hemoptysis , cough, dyspnea, wheezing, chest pain, palpitations, orthopnea, edema, abdominal pain, nausea, melena, diarrhea, constipation, flank pain, dysuria, hematuria, urinary  Frequency, nocturia, numbness, tingling, seizures,  Focal weakness, Loss of consciousness,  Tremor, insomnia, depression, anxiety, and suicidal ideation.      Objective:  BP  132/82 (BP Location: Left Arm, Patient Position: Sitting, Cuff Size: Normal)   Pulse 70   Temp 97.7 F (36.5 C) (Oral)   Resp 14   Ht 5\' 5"  (1.651 m)   Wt 157 lb (71.2 kg)   SpO2 98%   BMI 26.13 kg/m   BP Readings from Last 3 Encounters:  04/11/18 132/82  04/09/18 138/72  09/18/17 125/77    Wt Readings from Last 3 Encounters:  04/11/18 157 lb (71.2 kg)  04/09/18 156 lb 12.8 oz (71.1 kg)  09/18/17 155 lb (70.3 kg)    General appearance: alert, cooperative and appears stated age Ears: normal TM's and external ear canals both ears Throat: lips, mucosa, and tongue normal; teeth and gums normal Neck: no adenopathy, no carotid bruit, supple, symmetrical, trachea midline and thyroid not enlarged, symmetric, no tenderness/mass/nodules Back: symmetric, no curvature. ROM normal. No CVA tenderness. Lungs: clear to auscultation bilaterally Heart: regular rate and rhythm, S1, S2 normal, no murmur, click, rub or gallop Abdomen: soft, non-tender; bowel sounds normal; no masses,  no organomegaly Pulses: 2+ and symmetric Skin: Skin color, texture, turgor normal. No rashes or lesions Lymph nodes: Cervical, supraclavicular, and axillary nodes normal. MSK: full ROM right hip,  No reprodicible pain , no masses   No results found for: HGBA1C  Lab Results  Component Value Date   CREATININE 0.72 05/10/2017   CREATININE 0.70 06/19/2016   CREATININE 0.64 11/01/2015    Lab Results  Component Value Date   WBC 4.6 06/05/2017   HGB  13.0 06/05/2017   HCT 38.6 06/05/2017   PLT 244.0 06/05/2017   GLUCOSE 90 05/10/2017   CHOL 213 (H) 05/10/2017   TRIG 69.0 05/10/2017   HDL 55.40 05/10/2017   LDLDIRECT 142.0 05/10/2017   LDLCALC 144 (H) 05/10/2017   ALT 17 05/10/2017   AST 20 05/10/2017   NA 139 05/10/2017   K 4.1 05/10/2017   CL 103 05/10/2017   CREATININE 0.72 05/10/2017   BUN 14 05/10/2017   CO2 28 05/10/2017   TSH 1.23 05/10/2017   MICROALBUR 0.4 11/05/2013    No results  found.  Assessment & Plan:   Problem List Items Addressed This Visit    Chronic right hip pain    Present intermittently for a year,  Now more persistent, History is suggestive of post traumatic arthritis .  Plain films ordered to evaluate joint space. PT evaluation suggested but deferred.  Stretching and strengthening exercises recommended .        Other Visit Diagnoses    Pain of right hip joint    -  Primary   Relevant Orders   DG HIP UNILAT WITH PELVIS 2-3 VIEWS RIGHT     A total of 25 minutes of face to face time was spent with patient more than half of which was spent in counselling about the above mentioned conditions  and coordination of care  I am having Jennette Banker. [place] maintain her aspirin, losartan, Fish Oil, and Multiple Vitamins-Minerals (WOMENS MULTIVITAMIN PO). We will continue to administer betamethasone acetate-betamethasone sodium phosphate and betamethasone acetate-betamethasone sodium phosphate.  No orders of the defined types were placed in this encounter.   There are no discontinued medications.  Follow-up: No follow-ups on file.   Crecencio Mc, MD

## 2018-04-11 NOTE — Patient Instructions (Signed)

## 2018-04-12 DIAGNOSIS — M25551 Pain in right hip: Secondary | ICD-10-CM

## 2018-04-12 DIAGNOSIS — G8929 Other chronic pain: Secondary | ICD-10-CM | POA: Insufficient documentation

## 2018-04-12 NOTE — Assessment & Plan Note (Addendum)
Present intermittently for a year,  Now more persistent, History is suggestive of post traumatic arthritis .  Plain films ordered to evaluate joint space. PT evaluation suggested but deferred.  Stretching and strengthening exercises recommended .

## 2018-06-03 ENCOUNTER — Ambulatory Visit: Payer: PPO | Admitting: Internal Medicine

## 2018-06-06 ENCOUNTER — Ambulatory Visit (INDEPENDENT_AMBULATORY_CARE_PROVIDER_SITE_OTHER): Payer: PPO | Admitting: Internal Medicine

## 2018-06-06 ENCOUNTER — Encounter: Payer: Self-pay | Admitting: Internal Medicine

## 2018-06-06 VITALS — BP 130/76 | HR 77 | Temp 97.9°F | Resp 14 | Ht 65.0 in | Wt 158.0 lb

## 2018-06-06 DIAGNOSIS — E782 Mixed hyperlipidemia: Secondary | ICD-10-CM | POA: Diagnosis not present

## 2018-06-06 DIAGNOSIS — M25551 Pain in right hip: Secondary | ICD-10-CM | POA: Diagnosis not present

## 2018-06-06 DIAGNOSIS — T39395A Adverse effect of other nonsteroidal anti-inflammatory drugs [NSAID], initial encounter: Secondary | ICD-10-CM | POA: Diagnosis not present

## 2018-06-06 DIAGNOSIS — K2960 Other gastritis without bleeding: Secondary | ICD-10-CM

## 2018-06-06 DIAGNOSIS — I10 Essential (primary) hypertension: Secondary | ICD-10-CM

## 2018-06-06 DIAGNOSIS — R748 Abnormal levels of other serum enzymes: Secondary | ICD-10-CM

## 2018-06-06 DIAGNOSIS — G8929 Other chronic pain: Secondary | ICD-10-CM

## 2018-06-06 NOTE — Patient Instructions (Addendum)
You should avoid using aspirin,  Ibuprofen and aleve  More than one day per week given your past episode of bleeding   If you have pain,  Use ice for 15 minutes every few hours.   And Tylenol  You can use  up to 2000 mg of acetominophen (tylenol) every day safely  In divided doses (500 mg every 6 hours  Or 1000 mg every 12 hours.)  We can also ry topical voltaren for small areas    Return for  fasting labs

## 2018-06-06 NOTE — Assessment & Plan Note (Signed)
Occurred last year along with hematuria during NSAID use from Dr Amalia Hailey

## 2018-06-06 NOTE — Progress Notes (Signed)
Subjective:  Patient ID: Sally Morgan, female    DOB: [birth year]  Age: 72 y.o. MRN: [id number]  CC: The primary encounter diagnosis was Mixed hyperlipidemia. Diagnoses of NSAID induced gastritis, Elevated liver enzymes, Essential hypertension, Chronic right hip pain, and White coat syndrome with hypertension were also pertinent to this visit.  HPI RINIYAH SPEICH presents for follow up on hypertension , and chronic right hip pain   Plain films noted normal  Hip joint,  But the radiologist noted enthesopathy with calcification of a ligament superior to greater trochanter. She has a history of an injury in high school.   PT vs sports medicine referral was offered, but she has deferred referral because currently she is not in pain unless she does yard work  Patient is taking her medications as prescribed and notes no adverse effects.  Home BP readings have been done about once per week and are  generally < 130/80 .  She is avoiding added salt in her diet and walking regularly about 3 times per week for exercise  .Home readings are lower than here    . Tolerating losartan 25 mg daily   Chronic insomnia :  nothing tolerated or successful .  using melatonin with inconsistent  Results.  Tried Requip since legs were restless,  Did nt tolerate medication   Had gastritis from and interstitital cystitis (presumed based on conversation today where she reorts gross hematuria and abdominal pain ) from a short course of meloxicam ( stopped the medication after 3 days) prescribed by podiatry  for plantar fasciitis last October.    Outpatient Medications Prior to Visit  Medication Sig Dispense Refill  . aspirin 81 MG tablet Take 81 mg by mouth daily.    Marland Kitchen losartan (COZAAR) 25 MG tablet take 1 tablet by mouth once daily 90 tablet 3  . Multiple Vitamins-Minerals (WOMENS MULTIVITAMIN PO) Take 1 tablet by mouth.    . Omega-3 Fatty Acids (FISH OIL) 1000 MG CAPS Take 1 capsule by mouth.      Facility-Administered Medications Prior to Visit  Medication Dose Route Frequency Provider Last Rate Last Dose  . betamethasone acetate-betamethasone sodium phosphate (CELESTONE) injection 3 mg  3 mg Intramuscular Once Daylene Katayama M, DPM      . betamethasone acetate-betamethasone sodium phosphate (CELESTONE) injection 3 mg  3 mg Intramuscular Once Edrick Kins, DPM        Review of Systems;  Patient denies headache, fevers, malaise, unintentional weight loss, skin rash, eye pain, sinus congestion and sinus pain, sore throat, dysphagia,  hemoptysis , cough, dyspnea, wheezing, chest pain, palpitations, orthopnea, edema, abdominal pain, nausea, melena, diarrhea, constipation, flank pain, dysuria, hematuria, urinary  Frequency, nocturia, numbness, tingling, seizures,  Focal weakness, Loss of consciousness,  Tremor, insomnia, depression, anxiety, and suicidal ideation.      Objective:  BP 130/76 (BP Location: Left Arm, Patient Position: Sitting, Cuff Size: Normal)   Pulse 77   Temp 97.9 F (36.6 C) (Oral)   Resp 14   Ht 5\' 5"  (1.651 m)   Wt 158 lb (71.7 kg)   SpO2 97%   BMI 26.29 kg/m   BP Readings from Last 3 Encounters:  06/06/18 130/76  04/11/18 132/82  04/09/18 138/72    Wt Readings from Last 3 Encounters:  06/06/18 158 lb (71.7 kg)  04/11/18 157 lb (71.2 kg)  04/09/18 156 lb 12.8 oz (71.1 kg)    General appearance: alert, cooperative and appears stated age Ears: normal TM's and external  ear canals both ears Throat: lips, mucosa, and tongue normal; teeth and gums normal Neck: no adenopathy, no carotid bruit, supple, symmetrical, trachea midline and thyroid not enlarged, symmetric, no tenderness/mass/nodules Back: symmetric, no curvature. ROM normal. No CVA tenderness. Lungs: clear to auscultation bilaterally Heart: regular rate and rhythm, S1, S2 normal, no murmur, click, rub or gallop Abdomen: soft, non-tender; bowel sounds normal; no masses,  no  organomegaly Pulses: 2+ and symmetric Skin: Skin color, texture, turgor normal. No rashes or lesions Lymph nodes: Cervical, supraclavicular, and axillary nodes normal.  No results found for: HGBA1C  Lab Results  Component Value Date   CREATININE 0.72 05/10/2017   CREATININE 0.70 06/19/2016   CREATININE 0.64 11/01/2015    Lab Results  Component Value Date   WBC 4.6 06/05/2017   HGB 13.0 06/05/2017   HCT 38.6 06/05/2017   PLT 244.0 06/05/2017   GLUCOSE 90 05/10/2017   CHOL 213 (H) 05/10/2017   TRIG 69.0 05/10/2017   HDL 55.40 05/10/2017   LDLDIRECT 142.0 05/10/2017   LDLCALC 144 (H) 05/10/2017   ALT 17 05/10/2017   AST 20 05/10/2017   NA 139 05/10/2017   K 4.1 05/10/2017   CL 103 05/10/2017   CREATININE 0.72 05/10/2017   BUN 14 05/10/2017   CO2 28 05/10/2017   TSH 1.23 05/10/2017   MICROALBUR 0.4 11/05/2013    No results found.  Assessment & Plan:   Problem List Items Addressed This Visit    Chronic right hip pain    Secondary to OA and enthesopathy  Of ligaments noted on plain films.  She had a self reported episodes of interstitial cystitis and gastritis from 3 days of meloxicam.   Advised to limit NSAID use to one day per week,  Use ice and tylenol . Could try topical voltaren if pain worsens.       RESOLVED: Elevated liver enzymes   Hyperlipidemia - Primary    Untreated due to fear of statins.  10 yr risk of ASCVD using FRC is 20% .  She continues to defer therapy .    Lab Results  Component Value Date   CHOL 213 (H) 05/10/2017   HDL 55.40 05/10/2017   LDLCALC 144 (H) 05/10/2017   LDLDIRECT 142.0 05/10/2017   TRIG 69.0 05/10/2017   CHOLHDL 4 05/10/2017         Relevant Orders   Lipid panel   TSH   NSAID induced gastritis    Occurred last year along with hematuria during NSAID use from Dr Tobe Sos coat syndrome with hypertension    Home readings have been consistently < 130/80.  No changes today       Other Visit Diagnoses     Essential hypertension       Relevant Orders   Comprehensive metabolic panel   Microalbumin / creatinine urine ratio     A total of 25 minutes of face to face time was spent with patient more than half of which was spent in counselling about the above mentioned conditions  and coordination of care   I am having Jennette Banker. [place] maintain her aspirin, losartan, Fish Oil, and Multiple Vitamins-Minerals (WOMENS MULTIVITAMIN PO). We will continue to administer betamethasone acetate-betamethasone sodium phosphate and betamethasone acetate-betamethasone sodium phosphate.  No orders of the defined types were placed in this encounter.   There are no discontinued medications.  Follow-up: No follow-ups on file.   Crecencio Mc, MD

## 2018-06-08 NOTE — Assessment & Plan Note (Signed)
Secondary to OA and enthesopathy  Of ligaments noted on plain films.  She had a self reported episodes of interstitial cystitis and gastritis from 3 days of meloxicam.   Advised to limit NSAID use to one day per week,  Use ice and tylenol . Could try topical voltaren if pain worsens.

## 2018-06-08 NOTE — Assessment & Plan Note (Signed)
Home readings have been consistently < 130/80.  No changes today

## 2018-06-08 NOTE — Assessment & Plan Note (Signed)
Untreated due to fear of statins.  10 yr risk of ASCVD using FRC is 20% .  She continues to defer therapy .    Lab Results  Component Value Date   CHOL 213 (H) 05/10/2017   HDL 55.40 05/10/2017   LDLCALC 144 (H) 05/10/2017   LDLDIRECT 142.0 05/10/2017   TRIG 69.0 05/10/2017   CHOLHDL 4 05/10/2017

## 2018-06-12 ENCOUNTER — Other Ambulatory Visit (INDEPENDENT_AMBULATORY_CARE_PROVIDER_SITE_OTHER): Payer: PPO

## 2018-06-12 DIAGNOSIS — I10 Essential (primary) hypertension: Secondary | ICD-10-CM | POA: Diagnosis not present

## 2018-06-12 DIAGNOSIS — E782 Mixed hyperlipidemia: Secondary | ICD-10-CM

## 2018-06-12 LAB — COMPREHENSIVE METABOLIC PANEL
ALBUMIN: 4.4 g/dL (ref 3.5–5.2)
ALT: 17 U/L (ref 0–35)
AST: 21 U/L (ref 0–37)
Alkaline Phosphatase: 63 U/L (ref 39–117)
BUN: 13 mg/dL (ref 6–23)
CHLORIDE: 103 meq/L (ref 96–112)
CO2: 27 mEq/L (ref 19–32)
Calcium: 9.5 mg/dL (ref 8.4–10.5)
Creatinine, Ser: 0.69 mg/dL (ref 0.40–1.20)
GFR: 88.98 mL/min (ref >60.00)
Glucose, Bld: 96 mg/dL (ref 70–99)
Potassium: 3.7 mEq/L (ref 3.5–5.1)
SODIUM: 138 meq/L (ref 135–145)
Total Bilirubin: 0.8 mg/dL (ref 0.2–1.2)
Total Protein: 7.6 g/dL (ref 6.0–8.3)

## 2018-06-12 LAB — LIPID PANEL
CHOLESTEROL: 218 mg/dL — AB (ref 0–200)
HDL: 63.7 mg/dL (ref >39.00)
LDL CALC: 140 mg/dL — AB (ref 0–99)
NonHDL: 154.59
TRIGLYCERIDES: 73 mg/dL (ref 0.0–149.0)
Total CHOL/HDL Ratio: 3
VLDL: 14.6 mg/dL (ref 0.0–40.0)

## 2018-06-12 LAB — TSH: TSH: 0.92 u[IU]/mL (ref 0.35–4.50)

## 2018-06-12 NOTE — Addendum Note (Signed)
Addended by: Leeanne Rio on: 06/12/2018 09:40 AM   Modules accepted: Orders

## 2018-08-31 NOTE — Progress Notes (Signed)
Cardiology Office Note  Date:  09/02/2018   ID:  Sally Morgan, DOB 1946/01/31, MRN [id number]  PCP:  Crecencio Mc, MD   Chief Complaint  Patient presents with  . other    12 month follow up. Meds reviewed by the pt. verbally. Pt. c/o indigestion when bending over to work outside.     HPI:  Sally Morgan  is a pleasant 72 year-old woman who has a history of  hyperlipidemia,  vein ablation in on the left  post procedure DVT noted on ultrasound.  started on aspirin,  changed to xarelto twice a day after the thrombus extended, for 2 weeks Dilated aortic root Baseline EKG with diffuse T-wave abnormality Mild plaque in the aorta  She presents for routine followup of her hyperlipidemia and dilated aortic root  In follow-up today she denies any problems with her legs Still has varicosities left upper leg, occasionally tender but overall not a problem No leg swelling/edema  We have reviewed previous echocardiograms and CT scans No progression in aortic root, ascending aorta CT scan chest 2015 showing ascending aortic dilatation 3.7 cm Minimal aortic arch plaque, minimal descending aorta plaque, no coronary calcification  Echo, numbers reviewed with her today  stable aortic root 4.1 cm in 2016 echocardiogram 3.5 cm in 2017 Echocardiogram in 2015 showed aortic root 4.2 cm with ejection fraction 45-50%, mildly dilated left ventricle (possible stress cardiomyopathy)  Good exercise tolerance, no chest pain or shortness of breath on exertion  EKG personally reviewed by myself on todays visit Shows normal sinus rhythm with rate 72 bpm, T wave abnormality precordial leads, inferior leads, no change from previous EKGs   PMH:   has a past medical history of Aortic atherosclerosis ([place]), Arthritis, Dilated congestive cardiomyopathy ([place]), Diverticulitis, colon, GERD (gastroesophageal reflux disease), and TIA (transient ischemic attack).  PSH:    Past Surgical History:  Procedure  Laterality Date  . CESAREAN SECTION     x 2   . colonoscopy with polypectomy    . COLONOSCOPY WITH PROPOFOL N/A 09/18/2017   Procedure: COLONOSCOPY WITH PROPOFOL;  Surgeon: Manya Silvas, MD;  Location: [facility] ENDOSCOPY;  Service: Endoscopy;  Laterality: N/A;    Current Outpatient Medications  Medication Sig Dispense Refill  . losartan (COZAAR) 25 MG tablet take 1 tablet by mouth once daily 90 tablet 3  . Multiple Vitamins-Minerals (WOMENS MULTIVITAMIN PO) Take 1 tablet by mouth.    . Omega-3 Fatty Acids (FISH OIL) 1000 MG CAPS Take 1 capsule by mouth.     Current Facility-Administered Medications  Medication Dose Route Frequency Provider Last Rate Last Dose  . betamethasone acetate-betamethasone sodium phosphate (CELESTONE) injection 3 mg  3 mg Intramuscular Once Daylene Katayama M, DPM      . betamethasone acetate-betamethasone sodium phosphate (CELESTONE) injection 3 mg  3 mg Intramuscular Once Edrick Kins, DPM         Allergies:   Lisinopril and Meloxicam   Social History:  The patient  reports that she has never smoked. She has never used smokeless tobacco. She reports that she drinks about 4.0 standard drinks of alcohol per week. She reports that she does not use drugs.   Family History:   family history includes Appendicitis in her father; Breast cancer in her cousin; Cancer (age of onset: 59) in her brother; Early death in her father and mother; Tuberculosis in her mother.    Review of Systems: Review of Systems  Constitutional: Negative.   Respiratory: Negative.  Cardiovascular: Negative.   Gastrointestinal: Negative.   Musculoskeletal: Negative.   Neurological: Negative.   Psychiatric/Behavioral: Negative.   All other systems reviewed and are negative.    PHYSICAL EXAM: VS:  BP 140/80 (BP Location: Left Arm, Patient Position: Sitting, Cuff Size: Normal)   Pulse 72   Ht 5\' 5"  (1.651 m)   Wt 160 lb (72.6 kg)   BMI 26.63 kg/m  , BMI Body mass index is 26.63  kg/m. Constitutional:  oriented to person, place, and time. No distress.  HENT:  Head: Grossly normal Eyes:  no discharge. No scleral icterus.  Neck: No JVD, no carotid bruits  Cardiovascular: Regular rate and rhythm, no murmurs appreciated Pulmonary/Chest: Clear to auscultation bilaterally, no wheezes or rails Abdominal: Soft.  no distension.  no tenderness.  Musculoskeletal: Normal range of motion Neurological:  normal muscle tone. Coordination normal. No atrophy Skin: Skin warm and dry Psychiatric: normal affect, pleasant  Recent Labs: 06/12/2018: ALT 17; BUN 13; Creatinine, Ser 0.69; Potassium 3.7; Sodium 138; TSH 0.92    Lipid Panel Lab Results  Component Value Date   CHOL 218 (H) 06/12/2018   HDL 63.70 06/12/2018   LDLCALC 140 (H) 06/12/2018   TRIG 73.0 06/12/2018      Wt Readings from Last 3 Encounters:  09/02/18 160 lb (72.6 kg)  06/06/18 158 lb (71.7 kg)  04/11/18 157 lb (71.2 kg)       ASSESSMENT AND PLAN:  Deep venous thrombosis of femoral vein with thrombophlebitis, unspecified laterality ([place]) Reports that she should not take more than aspirin 81 mg daily Denies any further problems, no swelling Occasionally with sore varicosities left upper leg  Aortic atherosclerosis ([facility]) Minimal plaque noted on CT scan Previously did not want to be on a statin No further work-up at this time  Mixed hyperlipidemia diet, exercise Weight has been stable  Dilated aortic root ([facility]) Discussed with her in detail, images reviewed CT scan, echocardiograms Stable aortic root size and a sending aorta She prefers a repeat echocardiogram beginning of 2021  Congestive dilated cardiomyopathy ([place]) - Plan: EKG 12-Lead Appears euvolemic, No further work-up, ejection fraction  Disposition:   F/U  12 months   Total encounter time more than 25 minutes  Greater than 50% was spent in counseling and coordination of care with the patient    No orders of the defined types  were placed in this encounter.    Signed, Esmond Plants, M.D., Ph.D. 09/02/2018  [place], [place]

## 2018-09-02 ENCOUNTER — Ambulatory Visit: Payer: PPO | Admitting: Cardiovascular Disease

## 2018-09-02 ENCOUNTER — Encounter: Payer: Self-pay | Admitting: Cardiovascular Disease

## 2018-09-02 VITALS — BP 140/80 | HR 72 | Ht 65.0 in | Wt 160.0 lb

## 2018-09-02 DIAGNOSIS — I700 Atherosclerosis of aorta: Secondary | ICD-10-CM

## 2018-09-02 DIAGNOSIS — I82419 Acute embolism and thrombosis of unspecified femoral vein: Secondary | ICD-10-CM | POA: Diagnosis not present

## 2018-09-02 DIAGNOSIS — I420 Dilated cardiomyopathy: Secondary | ICD-10-CM

## 2018-09-02 DIAGNOSIS — I77810 Thoracic aortic ectasia: Secondary | ICD-10-CM | POA: Diagnosis not present

## 2018-09-02 DIAGNOSIS — E782 Mixed hyperlipidemia: Secondary | ICD-10-CM | POA: Diagnosis not present

## 2018-09-02 NOTE — Patient Instructions (Signed)

## 2018-09-06 ENCOUNTER — Other Ambulatory Visit: Payer: Self-pay | Admitting: Cardiovascular Disease

## 2018-09-15 DIAGNOSIS — H353131 Nonexudative age-related macular degeneration, bilateral, early dry stage: Secondary | ICD-10-CM | POA: Diagnosis not present

## 2018-09-16 IMAGING — DX DG FOOT COMPLETE 3+V*R*
3 series · 3 of 3 positions shown · non-contrast
Comparison: None in PACs

CLINICAL DATA: Right heel pain for the past month with no known
injury.

EXAM:
RIGHT FOOT COMPLETE - 3+ VIEW

[foot ap]
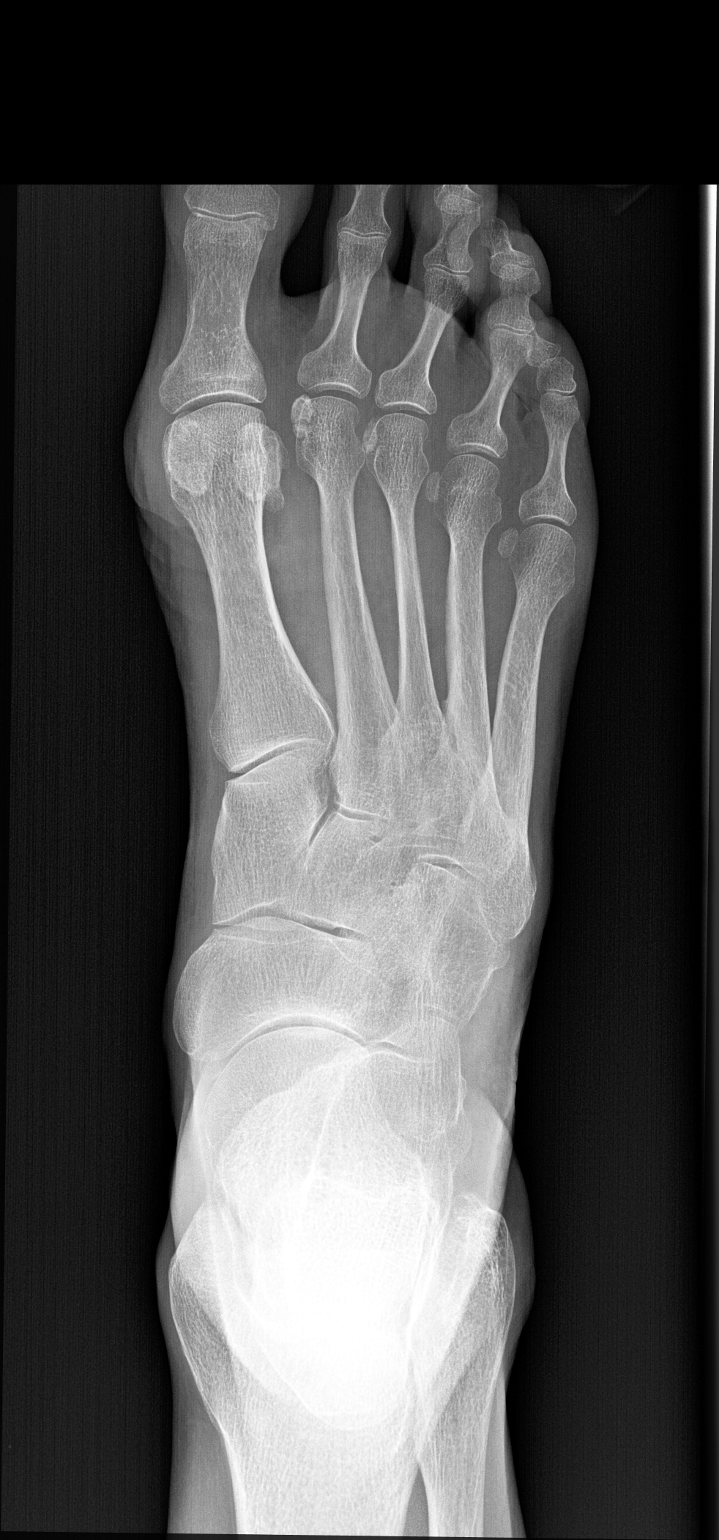

[foot obl (oblique)]
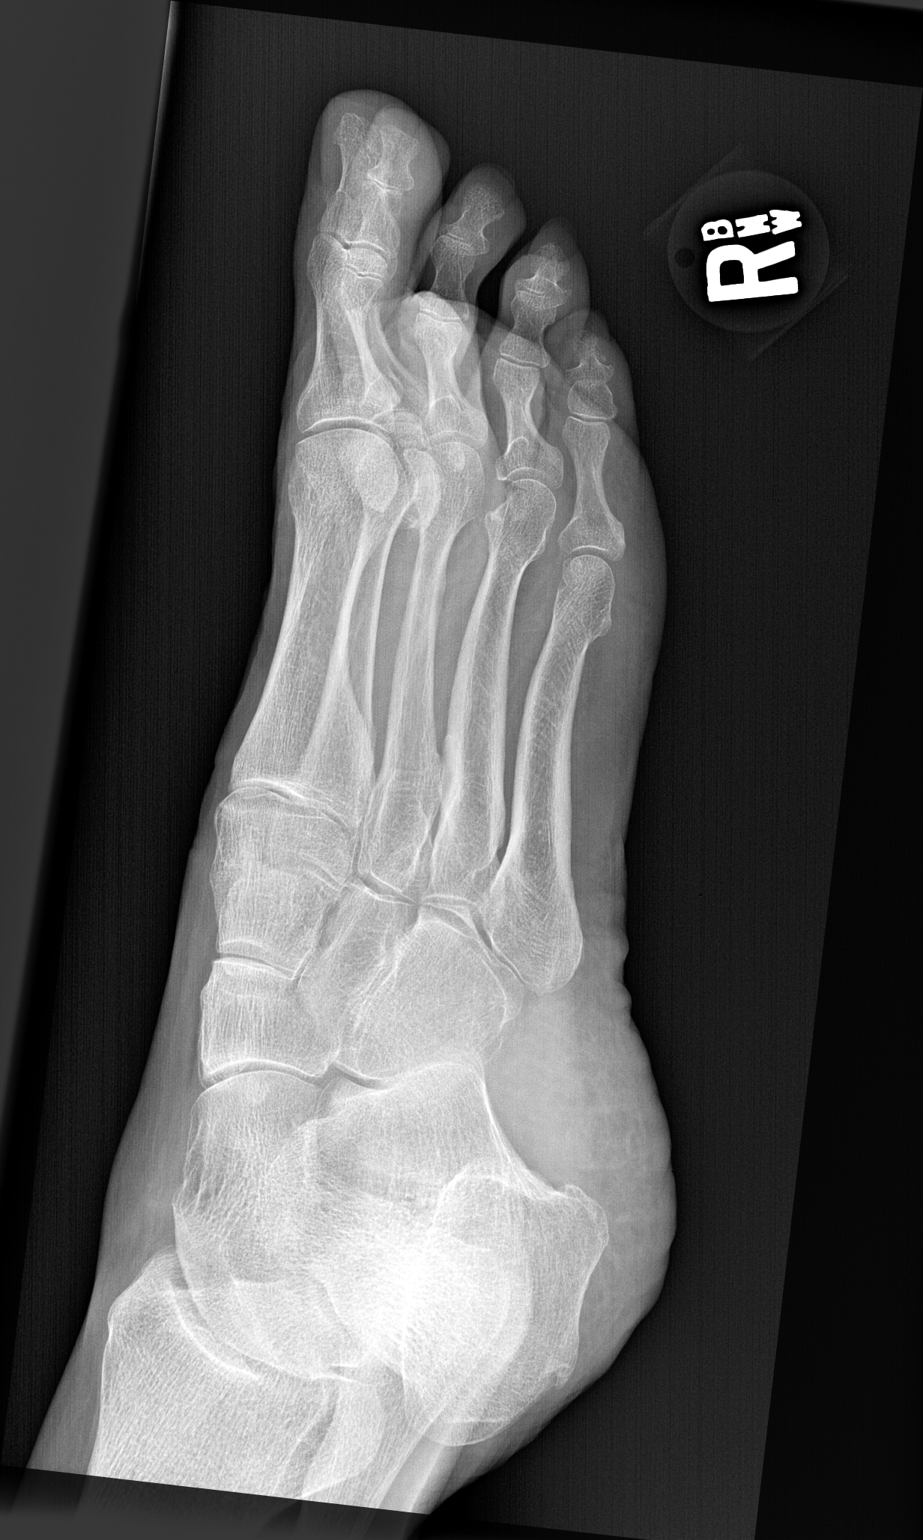

[foot lat]
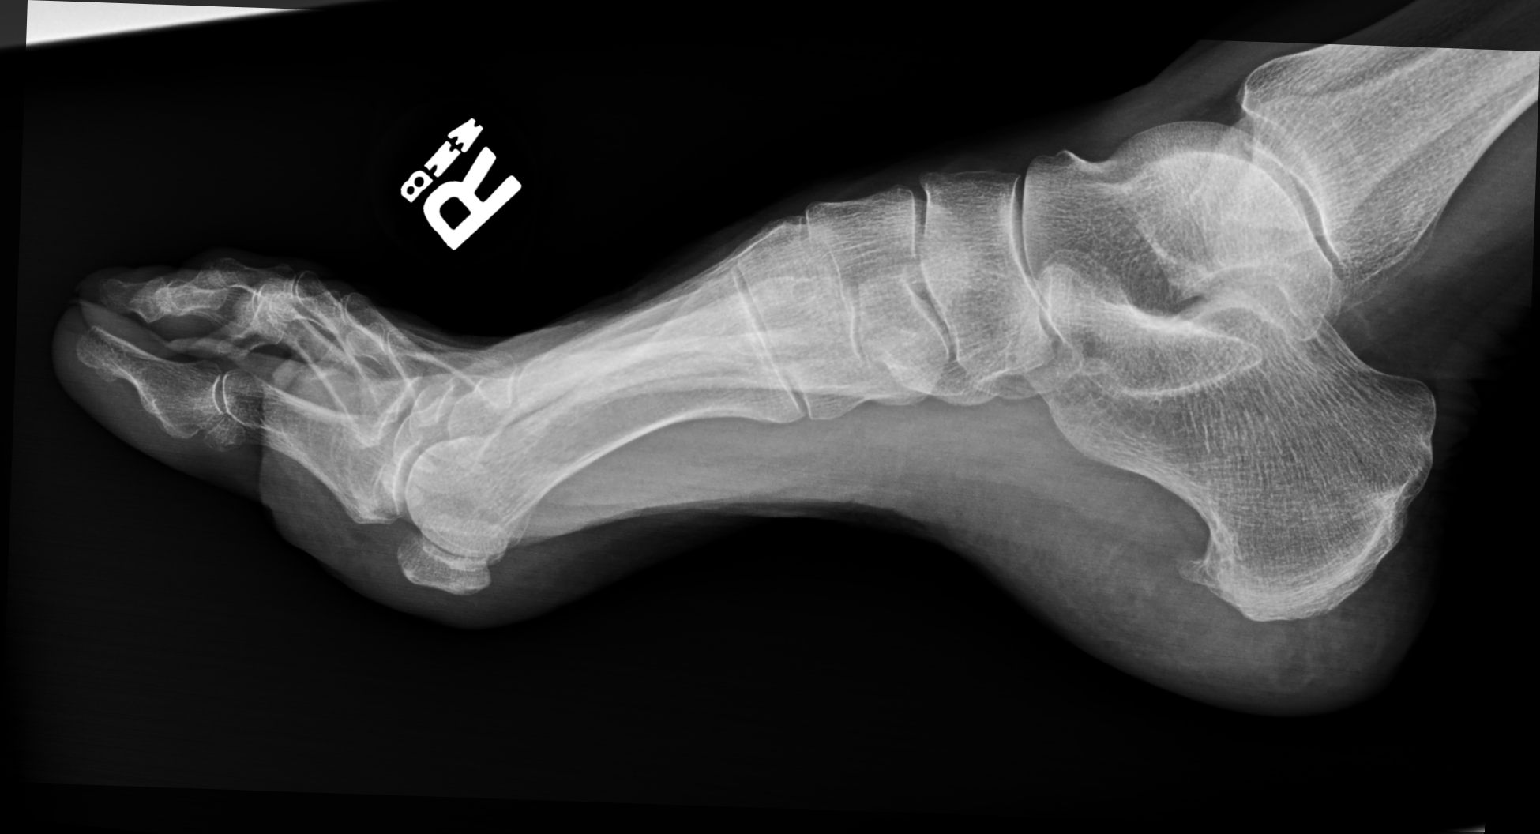

[3 of 3 positions shown; findings below may reference images not displayed]

FINDINGS: The bones of the right foot are subjectively adequately mineralized.
There is a moderate-sized plantar calcaneal spur. Otherwise the
calcaneus is unremarkable. The other tarsal bones as well as the
metatarsals and phalanges exhibit no acute abnormalities. No
significant osteoarthritic changes are observed. The soft tissues
are unremarkable.
IMPRESSION: There is a plantar calcaneal spur. The overlying soft tissues are
grossly normal. No acute bony abnormality of the right foot.

## 2018-09-29 ENCOUNTER — Ambulatory Visit (INDEPENDENT_AMBULATORY_CARE_PROVIDER_SITE_OTHER): Payer: PPO | Admitting: Family Medicine

## 2018-09-29 ENCOUNTER — Encounter: Payer: Self-pay | Admitting: Family Medicine

## 2018-09-29 VITALS — BP 132/82 | HR 73 | Temp 98.0°F | Resp 16 | Ht 65.0 in | Wt 162.0 lb

## 2018-09-29 DIAGNOSIS — R300 Dysuria: Secondary | ICD-10-CM

## 2018-09-29 DIAGNOSIS — R350 Frequency of micturition: Secondary | ICD-10-CM | POA: Diagnosis not present

## 2018-09-29 DIAGNOSIS — N390 Urinary tract infection, site not specified: Secondary | ICD-10-CM | POA: Diagnosis not present

## 2018-09-29 DIAGNOSIS — R319 Hematuria, unspecified: Secondary | ICD-10-CM | POA: Diagnosis not present

## 2018-09-29 DIAGNOSIS — Z87898 Personal history of other specified conditions: Secondary | ICD-10-CM

## 2018-09-29 LAB — POCT URINALYSIS DIPSTICK
BILIRUBIN UA: NEGATIVE
GLUCOSE UA: NEGATIVE
Ketones, UA: NEGATIVE
Nitrite, UA: NEGATIVE
PH UA: 6 (ref 5.0–8.0)
Protein, UA: NEGATIVE
Spec Grav, UA: 1.015 (ref 1.010–1.025)
Urobilinogen, UA: 0.2 E.U./dL

## 2018-09-29 MED ORDER — SCOPOLAMINE 1 MG/3DAYS TD PT72: 1.0000 | MEDICATED_PATCH | TRANSDERMAL | 2 refills | Status: DC

## 2018-09-29 MED ORDER — AMOXICILLIN-POT CLAVULANATE 875-125 MG PO TABS: 1.0000 | ORAL_TABLET | Freq: Two times a day (BID) | ORAL | 0 refills | Status: DC

## 2018-09-29 NOTE — Progress Notes (Signed)
Subjective:    Patient ID: Sally Morgan, female    DOB: [birth year], 72 y.o.   MRN: [id number]  HPI  Presents to clinic c/o increased urinary frequency, burning and pressure for 3-4 days.  Patient is concerned that she has UTI and would like this cleared up before she goes on her cruise after the new year.  Patient denies any nausea, vomiting or diarrhea.  Patient denies fever or chills.  Patient would also like refill of scopolamine patch to use on her cruise to help motion sickness.  Recent lab reviewed: CMP Latest Ref Rng & Units 06/12/2018 05/10/2017 06/19/2016  Glucose 70 - 99 mg/dL 96 90 93  BUN 6 - 23 mg/dL 13 14 14   Creatinine 0.40 - 1.20 mg/dL 0.69 0.72 0.70  Sodium 135 - 145 mEq/L 138 139 138  Potassium 3.5 - 5.1 mEq/L 3.7 4.1 4.0  Chloride 96 - 112 mEq/L 103 103 106  CO2 19 - 32 mEq/L 27 28 28   Calcium 8.4 - 10.5 mg/dL 9.5 9.6 9.2  Total Protein 6.0 - 8.3 g/dL 7.6 7.4 7.4  Total Bilirubin 0.2 - 1.2 mg/dL 0.8 0.8 0.5  Alkaline Phos 39 - 117 U/L 63 54 65  AST 0 - 37 U/L 21 20 23   ALT 0 - 35 U/L 17 17 24    Creat clearance calculated today: 87 ml/min  Liver functions stable  Patient Active Problem List   Diagnosis Date Noted  . NSAID induced gastritis 06/06/2018  . Chronic right hip pain 04/12/2018  . Restless legs syndrome 06/08/2017  . Pain of right heel 06/08/2017  . History of TIA (transient ischemic attack) 06/07/2016  . Insomnia due to anxiety and fear 12/08/2015  . Congestive dilated cardiomyopathy ([place]) 07/26/2014  . Dilated aortic root ([place]) 07/26/2014  . Travel sickness 04/02/2014  . Aortic atherosclerosis ([place]) 11/17/2013  . Hyperlipidemia 11/08/2013  . Encounter for preventive health examination 11/03/2013  . White coat syndrome with hypertension 11/02/2013  . DVT femoral (deep venous thrombosis) with thrombophlebitis ([place]) 11/02/2013  . Adenomatous polyp of colon 02/25/2012  . Breast screening, unspecified 11/25/2011  . Neutropenia ([place]) 11/25/2011   . Neuropathy, cervical plexus 11/25/2011  . Arthritis   . Diverticulitis, colon   . GERD (gastroesophageal reflux disease)   . LIPOMA 03/29/2008  . VARICOSE VEINS, LOWER EXTREMITIES 03/29/2008  . ACTINIC KERATOSIS 03/29/2008   Social History   Tobacco Use  . Smoking status: Never Smoker  . Smokeless tobacco: Never Used  Substance Use Topics  . Alcohol use: Yes    Alcohol/week: 4.0 standard drinks    Types: 1 Glasses of wine, 3 Standard drinks or equivalent per week    Comment: OCC   Review of Systems  Constitutional: Negative for chills, fatigue and fever.  HENT: Negative for congestion, ear pain, sinus pain and sore throat.   Eyes: Negative.   Respiratory: Negative for cough, shortness of breath and wheezing.   Cardiovascular: Negative for chest pain, palpitations and leg swelling.  Gastrointestinal: Negative for abdominal pain, diarrhea, nausea and vomiting.  Genitourinary:  + dysuria, frequency and urgency.  Musculoskeletal: Negative for arthralgias and myalgias.  Skin: Negative for color change, pallor and rash.  Neurological: Negative for syncope, light-headedness and headaches.  Psychiatric/Behavioral: The patient is not nervous/anxious.       Objective:   Physical Exam Vitals signs and nursing note reviewed.  Constitutional:      General: She is not in acute distress.    Appearance: She is not  toxic-appearing.  HENT:     Head: Normocephalic and atraumatic.     Mouth/Throat:     Mouth: Mucous membranes are moist.  Eyes:     General: No scleral icterus.    Extraocular Movements: Extraocular movements intact.     Conjunctiva/sclera: Conjunctivae normal.  Neck:     Musculoskeletal: Neck supple. No neck rigidity.  Cardiovascular:     Rate and Rhythm: Normal rate and regular rhythm.     Heart sounds: Normal heart sounds.  Pulmonary:     Effort: Pulmonary effort is normal. No respiratory distress.     Breath sounds: Normal breath sounds.  Abdominal:      General: There is no distension.     Tenderness: There is abdominal tenderness (mild left suprapbic tenderness. ).  Skin:    General: Skin is dry.     Coloration: Skin is not pale.  Neurological:     Mental Status: She is alert and oriented to person, place, and time.  Psychiatric:        Mood and Affect: Mood normal.        Behavior: Behavior normal.       Vitals:   09/29/18 1001  BP: 132/82  Pulse: 73  Resp: 16  Temp: 98 F (36.7 C)  SpO2: 97%    Assessment & Plan:   UTI with hematuria, frequency of urination, dysuria - urinalysis is positive for both bacteria and blood.  We will treat for UTI.  Past urine culture did show patient to have some resistances to antibiotics, so Augmentin was chosen due to it shown to be sensitive in the past with a UTI.  We have sent out another urine culture today to be sure we are on correct antibiotic.  Patient advised to increase water intake, avoid excess sugary/caffeinated beverages, wear cotton underwear and be sure to wipe front to back.  History of motion sickness - patient prescribed scopolamine patches to use as needed on her cruise.  Patient aware that she will be contacted results of urine culture and if antibiotic does need to be changed we will let her know.  Keep regularly scheduled follow-up with PCP as planned, return to clinic sooner if any issues arise.

## 2018-10-01 ENCOUNTER — Telehealth: Payer: Self-pay | Admitting: Internal Medicine

## 2018-10-01 LAB — URINE CULTURE
MICRO NUMBER: 91501887
SPECIMEN QUALITY: ADEQUATE

## 2018-10-01 NOTE — Telephone Encounter (Signed)
Patient calling to obtain lab results. Nurse triage currently unavailable. Please advise. Would like the results today due to it regarding an antibiotic for possible uti.   Copied from [place] [id number]. Topic: Quick Communication - Lab Results (Clinic Use ONLY) >> Oct 01, 2018  3:36 PM Neta Ehlers, [place] wrote: Called patient to inform them of 09/30/2018 lab results. When patient returns call, triage nurse may disclose results.

## 2018-10-01 NOTE — Telephone Encounter (Signed)
Pt given lab results and documented in result note.  

## 2018-10-13 ENCOUNTER — Telehealth: Payer: Self-pay | Admitting: *Deleted

## 2018-10-13 ENCOUNTER — Ambulatory Visit (INDEPENDENT_AMBULATORY_CARE_PROVIDER_SITE_OTHER): Payer: PPO | Admitting: Internal Medicine

## 2018-10-13 ENCOUNTER — Encounter: Payer: Self-pay | Admitting: Internal Medicine

## 2018-10-13 VITALS — BP 132/90 | HR 78 | Temp 97.9°F | Resp 15 | Ht 65.0 in | Wt 161.0 lb

## 2018-10-13 DIAGNOSIS — R300 Dysuria: Secondary | ICD-10-CM

## 2018-10-13 DIAGNOSIS — N3090 Cystitis, unspecified without hematuria: Secondary | ICD-10-CM | POA: Diagnosis not present

## 2018-10-13 DIAGNOSIS — R399 Unspecified symptoms and signs involving the genitourinary system: Secondary | ICD-10-CM

## 2018-10-13 LAB — POCT URINALYSIS DIPSTICK
BILIRUBIN UA: NEGATIVE
GLUCOSE UA: NEGATIVE
Ketones, UA: NEGATIVE
Nitrite, UA: POSITIVE
Protein, UA: NEGATIVE
Spec Grav, UA: <=1.005 — AB (ref 1.010–1.025)
Urobilinogen, UA: 0.2 E.U./dL
pH, UA: 5.5 (ref 5.0–8.0)

## 2018-10-13 NOTE — Progress Notes (Addendum)
Subjective:  Patient ID: Sally Morgan, female    DOB: 1945/10/19  Age: 72 y.o. MRN: [id number]  CC: The primary encounter diagnosis was UTI symptoms. A diagnosis of Cystitis was also pertinent to this visit.  HPI Sally Morgan presents for persistent UTI symptoms of foul odor and  Cloudiness) .  Hx:  She was treated on Dec 16 with Augmentin for 5 days for  UTI secondary to  enterococcus faecalis which was sensitive  To ampicillin .  Symptoms of suprapubic pressure and right flank pain resolved,  but urine is still malodoorous and cloudy in the morning.  Sheh Has increased water intake.  She is concerned about persistent infection as she is leaving for a cruise to th [place] on Friday. She is not sexually active  And has not been swimming.      Outpatient Medications Prior to Visit  Medication Sig Dispense Refill  . losartan (COZAAR) 25 MG tablet TAKE 1 TABLET BY MOUTH ONCE DAILY 90 tablet 0  . Multiple Vitamins-Minerals (WOMENS MULTIVITAMIN PO) Take 1 tablet by mouth.    . Omega-3 Fatty Acids (FISH OIL) 1000 MG CAPS Take 1 capsule by mouth.    Marland Kitchen scopolamine (TRANSDERM-SCOP, 1.5 MG,) 1 MG/3DAYS Place 1 patch (1.5 mg total) onto the skin every 3 (three) days. 10 patch 2  . amoxicillin-clavulanate (AUGMENTIN) 875-125 MG tablet Take 1 tablet by mouth 2 (two) times daily. 10 tablet 0   Facility-Administered Medications Prior to Visit  Medication Dose Route Frequency Provider Last Rate Last Dose  . betamethasone acetate-betamethasone sodium phosphate (CELESTONE) injection 3 mg  3 mg Intramuscular Once Daylene Katayama M, DPM      . betamethasone acetate-betamethasone sodium phosphate (CELESTONE) injection 3 mg  3 mg Intramuscular Once Edrick Kins, DPM        Review of Systems;  Patient denies headache, fevers, malaise, unintentional weight loss, skin rash, eye pain, sinus congestion and sinus pain, sore throat, dysphagia,  hemoptysis , cough, dyspnea, wheezing, chest pain,  palpitations, orthopnea, edema, abdominal pain, nausea, melena, diarrhea, constipation,  hematuria, urinary  Frequency, nocturia, numbness, tingling, seizures,  Focal weakness, Loss of consciousness,  Tremor, insomnia, depression, anxiety, and suicidal ideation.      Objective:  BP 132/90 (BP Location: Left Arm, Patient Position: Sitting, Cuff Size: Normal)   Pulse 78   Temp 97.9 F (36.6 C) (Oral)   Resp 15   Ht 5\' 5"  (1.651 m)   Wt 161 lb (73 kg)   SpO2 98%   BMI 26.79 kg/m   BP Readings from Last 3 Encounters:  10/13/18 132/90  09/29/18 132/82  09/02/18 140/80    Wt Readings from Last 3 Encounters:  10/13/18 161 lb (73 kg)  09/29/18 162 lb (73.5 kg)  09/02/18 160 lb (72.6 kg)    General appearance: alert, cooperative and appears stated age Back: symmetric, no curvature. ROM normal. No CVA tenderness. Lungs: clear to auscultation bilaterally Heart: regular rate and rhythm, S1, S2 normal, no murmur, click, rub or gallop Abdomen: soft, non-tender; bowel sounds normal; no masses,  no organomegaly  No results found for: HGBA1C  Lab Results  Component Value Date   CREATININE 0.69 06/12/2018   CREATININE 0.72 05/10/2017   CREATININE 0.70 06/19/2016    Lab Results  Component Value Date   WBC 4.6 06/05/2017   HGB 13.0 06/05/2017   HCT 38.6 06/05/2017   PLT 244.0 06/05/2017   GLUCOSE 96 06/12/2018   CHOL 218 (H) 06/12/2018  TRIG 73.0 06/12/2018   HDL 63.70 06/12/2018   LDLDIRECT 142.0 05/10/2017   LDLCALC 140 (H) 06/12/2018   ALT 17 06/12/2018   AST 21 06/12/2018   NA 138 06/12/2018   K 3.7 06/12/2018   CL 103 06/12/2018   CREATININE 0.69 06/12/2018   BUN 13 06/12/2018   CO2 27 06/12/2018   TSH 0.92 06/12/2018   MICROALBUR 0.4 11/05/2013    No results found.  Assessment & Plan:   Problem List Items Addressed This Visit    Cystitis    Treated December 16 with Augmentin bid x 5 days for Enteroccocus faecalis sensitive to ampicillin and vancomycin,   Less sensitive to macrobid, with persistent symptoms.  Repeat culture reveals that she has E Coli resistant to ampicillin and Septra and sensitive to fluroquinolones.  Levaquin 250 mg daily x 5 days,  (chosen due to preferred coverage over cipro by her insurance.)  Patient notified by phone on Jan 1   rx sent to [facility].  .         Other Visit Diagnoses    UTI symptoms    -  Primary   Relevant Orders   POCT urinalysis dipstick (Completed)   Urine Culture (Completed)   Urine Microscopic Only (Completed)      I have discontinued Jennette Banker. [facility]'s amoxicillin-clavulanate. I am also having her start on levofloxacin. Additionally, I am having her maintain her Fish Oil, Multiple Vitamins-Minerals (WOMENS MULTIVITAMIN PO), losartan, and scopolamine. We will continue to administer betamethasone acetate-betamethasone sodium phosphate and betamethasone acetate-betamethasone sodium phosphate.  Meds ordered this encounter  Medications  . levofloxacin (LEVAQUIN) 250 MG tablet    Sig: Take 1 tablet (250 mg total) by mouth daily.    Dispense:  7 tablet    Refill:  0    Medications Discontinued During This Encounter  Medication Reason  . amoxicillin-clavulanate (AUGMENTIN) 875-125 MG tablet Completed Course    Follow-up: No follow-ups on file.   Crecencio Mc, MD

## 2018-10-13 NOTE — Telephone Encounter (Signed)
Copied from [place] [id number]. Topic: Appointment Scheduling - Scheduling Inquiry for Clinic >> Oct 13, 2018  8:17 AM Conception Chancy, [place] wrote: Reason for CRM: patient is calling and was seen on 09/29/18 for a UTI. Patient states the pressure has gone but her urine is still cloudy with a odor smell. She would like to know if she can just come in for a urine sample since there is no appointments available. Patient is leaving for a 10 day cruise on 10/17/18 and does not wont to be on vacation like this. Please contact patient.

## 2018-10-13 NOTE — Telephone Encounter (Signed)
Pt has been scheduled for this afternoon at 4:30pm. Pt was advised to arrive between 4 and 4:15 so we could go ahead and get a urine specimen. Pt gave a verbal understanding.

## 2018-10-14 DIAGNOSIS — N3090 Cystitis, unspecified without hematuria: Secondary | ICD-10-CM | POA: Insufficient documentation

## 2018-10-14 LAB — URINALYSIS, MICROSCOPIC ONLY

## 2018-10-14 NOTE — Assessment & Plan Note (Addendum)
Treated December 16 with Augmentin bid x 5 days for Enteroccocus faecalis sensitive to ampicillin and vancomycin,  Less sensitive to macrobid, with persistent symptoms.  Repeat culture reveals that she has E Coli resistant to ampicillin and Septra and sensitive to fluroquinolones.  Levaquin 250 mg daily x 5 days,  (chosen due to preferred coverage over cipro by her insurance.)  Patient notified by phone on Jan 1   rx sent to [facility].  Marland Kitchen

## 2018-10-15 LAB — URINE CULTURE
MICRO NUMBER:: 91550902
SPECIMEN QUALITY:: ADEQUATE

## 2018-10-15 MED ORDER — LEVOFLOXACIN 250 MG PO TABS: 250.0000 mg | ORAL_TABLET | Freq: Every day | ORAL | 0 refills | Status: DC

## 2018-10-15 NOTE — Addendum Note (Signed)
Addended by: Crecencio Mc on: 10/15/2018 02:45 PM   Modules accepted: Orders

## 2018-12-25 ENCOUNTER — Other Ambulatory Visit: Payer: Self-pay | Admitting: Cardiovascular Disease

## 2019-03-30 ENCOUNTER — Other Ambulatory Visit: Payer: Self-pay

## 2019-03-30 ENCOUNTER — Telehealth: Payer: Self-pay | Admitting: Family Medicine

## 2019-03-30 ENCOUNTER — Ambulatory Visit (INDEPENDENT_AMBULATORY_CARE_PROVIDER_SITE_OTHER): Payer: PPO | Admitting: Family Medicine

## 2019-03-30 DIAGNOSIS — M25562 Pain in left knee: Secondary | ICD-10-CM

## 2019-03-30 DIAGNOSIS — G8929 Other chronic pain: Secondary | ICD-10-CM | POA: Diagnosis not present

## 2019-03-30 NOTE — Telephone Encounter (Signed)
Pt Scheduled for  X-ray 03/31/19 @ 2:45pm

## 2019-03-30 NOTE — Progress Notes (Signed)
Patient ID: Sally Morgan, female   DOB: 06/06/1946, 73 y.o.   MRN: [id number]    Virtual Visit via phone Note  This visit type was conducted due to national recommendations for restrictions regarding the COVID-19 pandemic (e.g. social distancing).  This format is felt to be most appropriate for this patient at this time.  All issues noted in this document were discussed and addressed.  No physical exam was performed (except for noted visual exam findings with Video Visits).   I connected with Patsy Lager today at 11:20 AM EDT by telephone and verified that I am speaking with the correct person using two identifiers. Location patient: home Location provider: [place] Persons participating in the virtual visit: patient, provider  I discussed the limitations, risks, security and privacy concerns of performing an evaluation and management service by telephone and the availability of in person appointments. I also discussed with the patient that there may be a patient responsible charge related to this service. The patient expressed understanding and agreed to proceed.   HPI:  Patient and I connected via telephone due to complaints of left knee pain.  Patient states pain of left knee has been increasing over the past couple of months.  States walking short distances is fine, but there will be times where the pain and knee is very sharp especially when she goes to do a turning type motion and also when trying to lay down at night.  States Tylenol does have somewhat of an effect in helping to reduce pain.  Patient denies any new injuries that could explain the current pain.  Patient wonders if she may be having arthritis and or ligament type injury that is causing the pain.  States the knee does not appear swollen or red.  No fever or chills.  No cough, shortness with or wheezing.  No chest pain.  No GI or GU complaints.   ROS: See pertinent positives and negatives per HPI.  Past  Medical History:  Diagnosis Date  . Aortic atherosclerosis ([place])   . Arthritis   . Dilated congestive cardiomyopathy ([place])   . Diverticulitis, colon   . GERD (gastroesophageal reflux disease)   . TIA (transient ischemic attack)     Past Surgical History:  Procedure Laterality Date  . CESAREAN SECTION     x 2   . colonoscopy with polypectomy    . COLONOSCOPY WITH PROPOFOL N/A 09/18/2017   Procedure: COLONOSCOPY WITH PROPOFOL;  Surgeon: Manya Silvas, MD;  Location: [facility] ENDOSCOPY;  Service: Endoscopy;  Laterality: N/A;    Family History  Problem Relation Age of Onset  . Cancer Brother 27       colon ca  . Early death Mother   . Tuberculosis Mother   . Early death Father   . Appendicitis Father   . Breast cancer Cousin        paternal   Social History   Tobacco Use  . Smoking status: Never Smoker  . Smokeless tobacco: Never Used  Substance Use Topics  . Alcohol use: Yes    Alcohol/week: 4.0 standard drinks    Types: 1 Glasses of wine, 3 Standard drinks or equivalent per week    Comment: OCC    Current Outpatient Medications:  .  levofloxacin (LEVAQUIN) 250 MG tablet, Take 1 tablet (250 mg total) by mouth daily., Disp: 7 tablet, Rfl: 0 .  losartan (COZAAR) 25 MG tablet, TAKE 1 TABLET BY MOUTH EVERY DAY, Disp: 90 tablet, Rfl: 2 .  Multiple Vitamins-Minerals (WOMENS MULTIVITAMIN PO), Take 1 tablet by mouth., Disp: , Rfl:  .  Omega-3 Fatty Acids (FISH OIL) 1000 MG CAPS, Take 1 capsule by mouth., Disp: , Rfl:  .  scopolamine (TRANSDERM-SCOP, 1.5 MG,) 1 MG/3DAYS, Place 1 patch (1.5 mg total) onto the skin every 3 (three) days., Disp: 10 patch, Rfl: 2  Current Facility-Administered Medications:  .  betamethasone acetate-betamethasone sodium phosphate (CELESTONE) injection 3 mg, 3 mg, Intramuscular, Once, Evans, Brent M, DPM .  betamethasone acetate-betamethasone sodium phosphate (CELESTONE) injection 3 mg, 3 mg, Intramuscular, Once, Evans, Dorathy Daft, DPM  EXAM:   GENERAL: alert, oriented, sounds well and in no acute distress  LUNGS: Speaking in full sentences, no signs of respiratory distress, breathing rate appears normal, no obvious gross SOB, gasping or wheezing  PSYCH/NEURO: pleasant and cooperative, no obvious depression or anxiety, speech and thought processing grossly intact  ASSESSMENT AND PLAN:  Discussed the following assessment and plan:  Chronic left knee pain - patient will come to clinic to get an x-ray.  We will also put in orthopedics referral per patient request.  Suspect the pain is related to arthritis.  Advised patient she can continue to use Tylenol as needed to help pain and also suggested topical rub like BenGay or Biofreeze for pain relief.  Advised to put a pillow under her knee whenever sitting to help offset pain.  Also offered to send in some tramadol to use as needed for more moderate to severe pain, however patient declines this Rx and will just use Tylenol and topical rubs.   I discussed the assessment and treatment plan with the patient. The patient was provided an opportunity to ask questions and all were answered. The patient agreed with the plan and demonstrated an understanding of the instructions.   The patient was advised to call back or seek an in-person evaluation if the symptoms worsen or if the condition fails to improve as anticipated.  I provided 15 minutes of non-face-to-face time during this encounter.   Jodelle Green, FNP

## 2019-03-30 NOTE — Telephone Encounter (Signed)
Please call to set up xray appt for patient  We are doing xray appts now to help with lab/xray flow  Thanks  LG

## 2019-03-31 ENCOUNTER — Other Ambulatory Visit: Payer: PPO

## 2019-03-31 ENCOUNTER — Ambulatory Visit (INDEPENDENT_AMBULATORY_CARE_PROVIDER_SITE_OTHER): Payer: PPO

## 2019-03-31 DIAGNOSIS — G8929 Other chronic pain: Secondary | ICD-10-CM

## 2019-03-31 DIAGNOSIS — M25562 Pain in left knee: Secondary | ICD-10-CM | POA: Diagnosis not present

## 2019-04-03 ENCOUNTER — Telehealth: Payer: Self-pay

## 2019-04-03 NOTE — Telephone Encounter (Signed)
Copied from [place] [id number]. Topic: Referral - Request for Referral >> Apr 03, 2019 11:08 AM Erick Blinks wrote: Has patient seen PCP for this complaint? Yes *If NO, is insurance requiring patient see PCP for this issue before PCP can refer them? Referral for which specialty: Orthopedics Preferred provider/office: [facility]  Reason for referral: Knee pain severe  [phone] VM please advise

## 2019-04-03 NOTE — Telephone Encounter (Signed)
Copied from [place] [id number]. Topic: Referral - Request for Referral >> Apr 03, 2019 11:08 AM Erick Blinks wrote: Has patient seen PCP for this complaint? Yes *If NO, is insurance requiring patient see PCP for this issue before PCP can refer them? Referral for which specialty: Orthopedics Preferred provider/office: [facility]  Reason for referral: Knee pain severe  [phone] VM please advise

## 2019-04-06 NOTE — Telephone Encounter (Signed)
Needs a virtual visit.  Since I have never seen her before for this

## 2019-04-07 ENCOUNTER — Ambulatory Visit
Admission: RE | Admit: 2019-04-07 | Discharge: 2019-04-07 | Disposition: A | Discharge status: Home / Self Care | Payer: PPO | Source: Ambulatory Visit | Attending: Family Medicine | Admitting: Family Medicine

## 2019-04-07 ENCOUNTER — Other Ambulatory Visit (HOSPITAL_COMMUNITY): Payer: Self-pay | Admitting: Family Medicine

## 2019-04-07 ENCOUNTER — Other Ambulatory Visit: Payer: Self-pay

## 2019-04-07 ENCOUNTER — Other Ambulatory Visit: Payer: Self-pay | Admitting: Family Medicine

## 2019-04-07 DIAGNOSIS — R600 Localized edema: Secondary | ICD-10-CM

## 2019-04-07 DIAGNOSIS — M25462 Effusion, left knee: Secondary | ICD-10-CM | POA: Insufficient documentation

## 2019-04-07 DIAGNOSIS — M25562 Pain in left knee: Secondary | ICD-10-CM

## 2019-04-07 NOTE — Telephone Encounter (Signed)
Spoke with pt and she stated that she went to [facility] and they have placed a referral for her to orthopedics.

## 2019-04-13 DIAGNOSIS — M2392 Unspecified internal derangement of left knee: Secondary | ICD-10-CM | POA: Diagnosis not present

## 2019-04-13 DIAGNOSIS — M25562 Pain in left knee: Secondary | ICD-10-CM | POA: Diagnosis not present

## 2019-04-13 DIAGNOSIS — M25462 Effusion, left knee: Secondary | ICD-10-CM | POA: Diagnosis not present

## 2019-04-14 ENCOUNTER — Other Ambulatory Visit: Payer: Self-pay | Admitting: Sports Medicine

## 2019-04-14 DIAGNOSIS — M25562 Pain in left knee: Secondary | ICD-10-CM

## 2019-04-14 DIAGNOSIS — M25462 Effusion, left knee: Secondary | ICD-10-CM

## 2019-04-14 DIAGNOSIS — M1712 Unilateral primary osteoarthritis, left knee: Secondary | ICD-10-CM

## 2019-04-29 ENCOUNTER — Other Ambulatory Visit: Payer: Self-pay

## 2019-04-29 ENCOUNTER — Ambulatory Visit
Admission: RE | Admit: 2019-04-29 | Discharge: 2019-04-29 | Disposition: A | Discharge status: Home / Self Care | Payer: PPO | Source: Ambulatory Visit | Attending: Sports Medicine | Admitting: Sports Medicine

## 2019-04-29 DIAGNOSIS — M25562 Pain in left knee: Secondary | ICD-10-CM | POA: Diagnosis not present

## 2019-04-29 DIAGNOSIS — M25462 Effusion, left knee: Secondary | ICD-10-CM | POA: Diagnosis not present

## 2019-04-29 DIAGNOSIS — M1712 Unilateral primary osteoarthritis, left knee: Secondary | ICD-10-CM

## 2019-05-06 DIAGNOSIS — M25462 Effusion, left knee: Secondary | ICD-10-CM | POA: Diagnosis not present

## 2019-05-11 DIAGNOSIS — M25562 Pain in left knee: Secondary | ICD-10-CM | POA: Diagnosis not present

## 2019-05-11 DIAGNOSIS — R29898 Other symptoms and signs involving the musculoskeletal system: Secondary | ICD-10-CM | POA: Diagnosis not present

## 2019-05-11 DIAGNOSIS — M25662 Stiffness of left knee, not elsewhere classified: Secondary | ICD-10-CM | POA: Diagnosis not present

## 2019-05-11 DIAGNOSIS — S83242D Other tear of medial meniscus, current injury, left knee, subsequent encounter: Secondary | ICD-10-CM | POA: Diagnosis not present

## 2019-05-18 DIAGNOSIS — S83242D Other tear of medial meniscus, current injury, left knee, subsequent encounter: Secondary | ICD-10-CM | POA: Diagnosis not present

## 2019-06-04 ENCOUNTER — Other Ambulatory Visit: Payer: Self-pay

## 2019-06-04 ENCOUNTER — Ambulatory Visit (INDEPENDENT_AMBULATORY_CARE_PROVIDER_SITE_OTHER): Payer: PPO

## 2019-06-04 DIAGNOSIS — Z1231 Encounter for screening mammogram for malignant neoplasm of breast: Secondary | ICD-10-CM

## 2019-06-04 DIAGNOSIS — Z0000 Encounter for general adult medical examination without abnormal findings: Secondary | ICD-10-CM | POA: Diagnosis not present

## 2019-06-04 NOTE — Patient Instructions (Addendum)
  Sally Morgan , Thank you for taking time to come for your Medicare Wellness Visit. I appreciate your ongoing commitment to your health goals. Please review the following plan we discussed and let me know if I can assist you in the future.   These are the goals we discussed: Goals      Patient Stated   . Increase physical activity (pt-stated)     Walk for exercise        This is a list of the screening recommended for you and due dates:  Health Maintenance  Topic Date Due  . Tetanus Vaccine  02/03/2013  . Mammogram  05/08/2019  . Flu Shot  05/16/2019  . Colon Cancer Screening  09/18/2022  . DEXA scan (bone density measurement)  Completed  .  Hepatitis C: One time screening is recommended by Center for Disease Control  (CDC) for  adults born from [birth year] through 1964.   Completed

## 2019-06-04 NOTE — Progress Notes (Signed)
Subjective:   Sally Morgan is a 73 y.o. female who presents for Medicare Annual (Subsequent) preventive examination.  Review of Systems:  No ROS.  Medicare Wellness Virtual Visit.  Visual/audio telehealth visit, UTA vital signs.   See social history for additional risk factors.   Cardiac Risk Factors include: advanced age (>32men, >43 women)     Objective:     Vitals: There were no vitals taken for this visit.  There is no height or weight on file to calculate BMI.  Advanced Directives 06/04/2019 04/09/2018 09/18/2017 04/08/2017 10/12/2015  Does Patient Have a Medical Advance Directive? Yes Yes Yes Yes Yes  Type of Paramedic of [place];Living will [place];Living will Living will Living will;Healthcare Power of [place];Living will  Does patient want to make changes to medical advance directive? No - Patient declined No - Patient declined - No - Patient declined No - Patient declined  Copy of [place] in Chart? No - copy requested No - copy requested - No - copy requested No - copy requested    Tobacco Social History   Tobacco Use  Smoking Status Never Smoker  Smokeless Tobacco Never Used     Counseling given: Not Answered   Clinical Intake:  Pre-visit preparation completed: Yes        Diabetes: No  How often do you need to have someone help you when you read instructions, pamphlets, or other written materials from your doctor or pharmacy?: 1 - Never  Interpreter Needed?: No     Past Medical History:  Diagnosis Date  . Aortic atherosclerosis ([place])   . Arthritis   . Dilated congestive cardiomyopathy ([place])   . Diverticulitis, colon   . GERD (gastroesophageal reflux disease)   . TIA (transient ischemic attack)    Past Surgical History:  Procedure Laterality Date  . CESAREAN SECTION     x 2   . colonoscopy with polypectomy    . COLONOSCOPY WITH PROPOFOL N/A  09/18/2017   Procedure: COLONOSCOPY WITH PROPOFOL;  Surgeon: Manya Silvas, MD;  Location: [facility] ENDOSCOPY;  Service: Endoscopy;  Laterality: N/A;   Family History  Problem Relation Age of Onset  . Cancer Brother 35       colon ca  . Early death Mother   . Tuberculosis Mother   . Early death Father   . Appendicitis Father   . Breast cancer Cousin        paternal   Social History   Socioeconomic History  . Marital status: Widowed    Spouse name: Not on file  . Number of children: Not on file  . Years of education: Not on file  . Highest education level: Not on file  Occupational History  . Not on file  Social Needs  . Financial resource strain: Not hard at all  . Food insecurity    Worry: Never true    Inability: Never true  . Transportation needs    Medical: No    Non-medical: No  Tobacco Use  . Smoking status: Never Smoker  . Smokeless tobacco: Never Used  Substance and Sexual Activity  . Alcohol use: Yes    Alcohol/week: 4.0 standard drinks    Types: 1 Glasses of wine, 3 Standard drinks or equivalent per week    Comment: OCC  . Drug use: No  . Sexual activity: Not Currently  Lifestyle  . Physical activity    Days per week:  3 days    Minutes per session: 30 min  . Stress: Not at all  Relationships  . Social Herbalist on phone: Not on file    Gets together: Not on file    Attends religious service: Not on file    Active member of club or organization: Not on file    Attends meetings of clubs or organizations: Not on file    Relationship status: Not on file  Other Topics Concern  . Not on file  Social History Narrative  . Not on file    Outpatient Encounter Medications as of 06/04/2019  Medication Sig  . levofloxacin (LEVAQUIN) 250 MG tablet Take 1 tablet (250 mg total) by mouth daily.  Marland Kitchen losartan (COZAAR) 25 MG tablet TAKE 1 TABLET BY MOUTH EVERY DAY  . Multiple Vitamins-Minerals (WOMENS MULTIVITAMIN PO) Take 1 tablet by mouth.  . Omega-3  Fatty Acids (FISH OIL) 1000 MG CAPS Take 1 capsule by mouth.  Marland Kitchen scopolamine (TRANSDERM-SCOP, 1.5 MG,) 1 MG/3DAYS Place 1 patch (1.5 mg total) onto the skin every 3 (three) days.   Facility-Administered Encounter Medications as of 06/04/2019  Medication  . betamethasone acetate-betamethasone sodium phosphate (CELESTONE) injection 3 mg  . betamethasone acetate-betamethasone sodium phosphate (CELESTONE) injection 3 mg    Activities of Daily Living In your present state of health, do you have any difficulty performing the following activities: 06/04/2019  Hearing? N  Vision? N  Difficulty concentrating or making decisions? N  Walking or climbing stairs? N  Dressing or bathing? N  Doing errands, shopping? N  Preparing Food and eating ? N  Using the Toilet? N  In the past six months, have you accidently leaked urine? N  Do you have problems with loss of bowel control? N  Managing your Medications? N  Managing your Finances? N  Housekeeping or managing your Housekeeping? N  Some recent data might be hidden    Patient Care Team: Crecencio Mc, MD as PCP - General (Internal Medicine) Minna Merritts, MD as Consulting Physician (Cardiology)    Assessment:   This is a routine wellness examination for Sally Morgan.  I connected with patient 06/04/19 at  9:00 AM EDT by a video/audio enabled telemedicine application and verified that I am speaking with the correct person using two identifiers. Patient stated full name and DOB. Patient gave permission to continue with virtual visit. Patient's location was at home and Nurse's location was at [place] office.   Health Maintenance Due: Influenza vaccine 2020- discussed; to be completed in season with doctor or local pharmacy.    Tdap- discussed; to be completed with doctor in visit or local pharmacy.    Mammogram- ordered today. She plans to schedule. Update all pending maintenance due as appropriate.   See completed HM at the end of note.    Eye: Visual acuity not assessed. Virtual visit. Wears corrective lenses. Followed by their ophthalmologist every months.   Dental: Visits every 6 months.    Hearing: Demonstrates normal hearing during visit.  Safety:  Patient feels safe at home- yes Patient does have smoke detectors at home- yes Patient does wear sunscreen or protective clothing when in direct sunlight - yes Patient does wear seat belt when in a moving vehicle - yes Patient drives- yes Adequate lighting in walkways free from debris- yes Grab bars and handrails used as appropriate- yes Ambulates with no assistive device Cell phone on person when ambulating outside of the home- yes  Social Alcohol  intake -  yes      Smoking history- never   Smokers in home? none Illicit drug use? none  Depression: PHQ 2 &9 complete. See screening below. Denies irritability, anhedonia, sadness/tearfullness.  Stable.   Falls: See screening below.    Medication: Taking as directed and without issues.   Covid-19: Precautions and sickness symptoms discussed. Wears mask, social distancing, hand hygiene as appropriate.   Activities of Daily Living Patient denies needing assistance with: household chores, feeding themselves, getting from bed to chair, getting to the toilet, bathing/showering, dressing, managing money, or preparing meals.   Memory: Patient is alert. Patient denies difficulty focusing or concentrating. Correctly identified the president of the [place], season and recall. Patient likes to read a little for brain stimulation.  BMI- discussed the importance of a healthy diet, water intake and the benefits of aerobic exercise.  Educational material provided.  Physical activity- no routine. Torn meniscus; awaiting second opinion before surgery.   Diet: regular. She does not take red yeast rice but monitors her diet. Water: good intake Caffeine: tea in the morning and weak coffee once in awhile  Advanced Directive:  End of life planning; Advance aging; Advanced directives discussed.  Copy of current HCPOA/Living Will requested.    Other Providers Patient Care Team: Crecencio Mc, MD as PCP - General (Internal Medicine) Minna Merritts, MD as Consulting Physician (Cardiology)  Exercise Activities and Dietary recommendations Current Exercise Habits: The patient does not participate in regular exercise at present  Goals      Patient Stated   . Increase physical activity (pt-stated)     Walk for exercise        Fall Risk Fall Risk  06/04/2019 03/30/2019 04/09/2018 04/08/2017 10/12/2015  Falls in the past year? 0 0 No No No  Number falls in past yr: - 0 - - -  Injury with Fall? - 0 - - -  Follow up - Falls evaluation completed - - -   Timed Get Up and Go performed: no, virtual visit  Depression Screen PHQ 2/9 Scores 06/04/2019 03/30/2019 04/09/2018 04/08/2017  PHQ - 2 Score 0 0 0 0  PHQ- 9 Score - 0 - 0     Cognitive Function MMSE - Mini Mental State Exam 04/09/2018 04/08/2017 10/12/2015  Orientation to time 5 5 5   Orientation to Place 5 5 5   Registration 3 3 3   Attention/ Calculation 5 5 5   Recall 2 3 3   Language- name 2 objects 2 2 2   Language- repeat 1 1 1   Language- follow 3 step command 3 3 3   Language- read & follow direction 1 1 1   Write a sentence 1 1 1   Copy design 1 1 1   Total score 29 30 30      6CIT Screen 06/04/2019  What Year? 0 points  What month? 0 points  What time? 0 points  Count back from 20 0 points  Months in reverse 0 points  Repeat phrase 0 points  Total Score 0    Immunization History  Administered Date(s) Administered  . Influenza Whole 08/06/2010  . Influenza-Unspecified 09/13/2018  . Pneumococcal Conjugate-13 11/02/2013  . Pneumococcal Polysaccharide-23 12/08/2015  . Td 02/04/2003   Screening Tests Health Maintenance  Topic Date Due  . TETANUS/TDAP  02/03/2013  . MAMMOGRAM  05/08/2019  . INFLUENZA VACCINE  05/16/2019  . COLONOSCOPY   09/18/2022  . DEXA SCAN  Completed  . Hepatitis C Screening  Completed  Plan:    Keep all routine maintenance appointments.   Follow up 06/08/19 at 8:00  Medicare Attestation I have personally reviewed: The patient's medical and social history Their use of alcohol, tobacco or illicit drugs Their current medications and supplements The patient's functional ability including ADLs,fall risks, home safety risks, cognitive, and hearing and visual impairment Diet and physical activities Evidence for depression   In addition, I have reviewed and discussed with patient certain preventive protocols, quality metrics, and best practice recommendations. A written personalized care plan for preventive services as well as general preventive health recommendations were provided to patient.     Varney Biles, LPN  [id number]

## 2019-06-05 ENCOUNTER — Ambulatory Visit: Payer: PPO | Admitting: Internal Medicine

## 2019-06-05 ENCOUNTER — Ambulatory Visit: Payer: PPO

## 2019-06-08 ENCOUNTER — Ambulatory Visit (INDEPENDENT_AMBULATORY_CARE_PROVIDER_SITE_OTHER): Payer: PPO | Admitting: Internal Medicine

## 2019-06-08 ENCOUNTER — Encounter: Payer: Self-pay | Admitting: Internal Medicine

## 2019-06-08 ENCOUNTER — Ambulatory Visit: Payer: PPO

## 2019-06-08 ENCOUNTER — Other Ambulatory Visit: Payer: Self-pay

## 2019-06-08 DIAGNOSIS — E782 Mixed hyperlipidemia: Secondary | ICD-10-CM

## 2019-06-08 DIAGNOSIS — I77810 Thoracic aortic ectasia: Secondary | ICD-10-CM | POA: Diagnosis not present

## 2019-06-08 DIAGNOSIS — S83207S Unspecified tear of unspecified meniscus, current injury, left knee, sequela: Secondary | ICD-10-CM | POA: Diagnosis not present

## 2019-06-08 DIAGNOSIS — N3090 Cystitis, unspecified without hematuria: Secondary | ICD-10-CM

## 2019-06-08 DIAGNOSIS — R350 Frequency of micturition: Secondary | ICD-10-CM

## 2019-06-08 DIAGNOSIS — I10 Essential (primary) hypertension: Secondary | ICD-10-CM | POA: Diagnosis not present

## 2019-06-08 DIAGNOSIS — I420 Dilated cardiomyopathy: Secondary | ICD-10-CM

## 2019-06-08 DIAGNOSIS — D709 Neutropenia, unspecified: Secondary | ICD-10-CM

## 2019-06-08 DIAGNOSIS — E559 Vitamin D deficiency, unspecified: Secondary | ICD-10-CM | POA: Diagnosis not present

## 2019-06-08 HISTORY — DX: Unspecified tear of unspecified meniscus, current injury, left knee, sequela: S83.207S

## 2019-06-08 NOTE — Assessment & Plan Note (Signed)
EF is normal   Diastolic dysfunction noted.

## 2019-06-08 NOTE — Assessment & Plan Note (Signed)
Stable by most recent ECHO.

## 2019-06-08 NOTE — Progress Notes (Signed)
Telephone  Note  This visit type was conducted due to national recommendations for restrictions regarding the COVID-19 pandemic (e.g. social distancing).  This format is felt to be most appropriate for this patient at this time.  All issues noted in this document were discussed and addressed.  No physical exam was performed (except for noted visual exam findings with Video Visits).   I connected with@ on 06/08/19 at  8:00 AM EDT by a  telephone and verified that I am speaking with the correct person using two identifiers. Location patient: home Location provider: work or home office Persons participating in the virtual visit: patient, provider  I discussed the limitations, risks, security and privacy concerns of performing an evaluation and management service by telephone and the availability of in person appointments. I also discussed with the patient that there may be a patient responsible charge related to this service. The patient expressed understanding and agreed to proceed.  Reason for visit: follow up last visit Dec 2019   HPI:  1) HTN:  She has diastolic dysfunction ,   aortic root dilation that is   unchanged by 2019 ECHO.  Home bp's: 120/80 and also  at orthopedics twice   2) left knee with swelling DUE TO MENISCAL TEAR identified by MRI done in June.  Has had PT  Made it worse . Has not been able to walk or do any outdoor activities.  Has appt with hooten in early October.   3) recurrent symptoms of UTI. .  Treated last October.  symptoms  improved for a few week, , taking cranberry tablet supplements.  "something  doesn't feel right"    ROS: Patient denies headache, fevers, malaise, unintentional weight loss, skin rash, eye pain, sinus congestion and sinus pain, sore throat, dysphagia,  hemoptysis , cough, dyspnea, wheezing, chest pain, palpitations, orthopnea, edema, abdominal pain, nausea, melena, diarrhea, constipation, flank pain, dysuria, hematuria,  nocturia, numbness,  tingling, seizures,  Focal weakness, Loss of consciousness,  Tremor, insomnia, depression, anxiety, and suicidal ideation.      Past Medical History:  Diagnosis Date  . Aortic atherosclerosis ([place])   . Arthritis   . Dilated congestive cardiomyopathy ([place])   . Diverticulitis, colon   . GERD (gastroesophageal reflux disease)   . TIA (transient ischemic attack)     Past Surgical History:  Procedure Laterality Date  . CESAREAN SECTION     x 2   . colonoscopy with polypectomy    . COLONOSCOPY WITH PROPOFOL N/A 09/18/2017   Procedure: COLONOSCOPY WITH PROPOFOL;  Surgeon: Manya Silvas, MD;  Location: [facility] ENDOSCOPY;  Service: Endoscopy;  Laterality: N/A;    Family History  Problem Relation Age of Onset  . Cancer Brother 76       colon ca  . Early death Mother   . Tuberculosis Mother   . Early death Father   . Appendicitis Father   . Breast cancer Cousin        paternal    SOCIAL HX:  reports that she has never smoked. She has never used smokeless tobacco. She reports current alcohol use of about 4.0 standard drinks of alcohol per week. She reports that she does not use drugs.   Current Outpatient Medications:  Marland Kitchen  GARLIC OIL PO, Take by mouth., Disp: , Rfl:  .  losartan (COZAAR) 25 MG tablet, TAKE 1 TABLET BY MOUTH EVERY DAY, Disp: 90 tablet, Rfl: 2 .  Multiple Vitamins-Minerals (WOMENS MULTIVITAMIN PO), Take 1 tablet by mouth., Disp: ,  Rfl:  .  Omega-3 Fatty Acids (FISH OIL) 1000 MG CAPS, Take 1 capsule by mouth., Disp: , Rfl:   Current Facility-Administered Medications:  .  betamethasone acetate-betamethasone sodium phosphate (CELESTONE) injection 3 mg, 3 mg, Intramuscular, Once, Evans, Brent M, DPM .  betamethasone acetate-betamethasone sodium phosphate (CELESTONE) injection 3 mg, 3 mg, Intramuscular, Once, Evans, Dorathy Daft, DPM  EXAM:  VITALS per patient if applicable:  General impression: alert, cooperative and articulate.  No signs of being in distress  Lungs:  speech is fluent sentence length suggests that patient is not short of breath and not punctuated by cough, sneezing or sniffing. Marland Kitchen   Psych: affect normal.  speech is articulate and non pressured .  Denies suicidal thoughts   ASSESSMENT AND PLAN:  Discussed the following assessment and plan:  Neutropenia, unspecified type ([place]) - Plan: CBC with Differential/Platelet  Mixed hyperlipidemia - Plan: TSH, Lipid panel  Vitamin D deficiency - Plan: VITAMIN D 25 Hydroxy (Vit-D Deficiency, Fractures)  White coat syndrome with hypertension - Plan: Comprehensive metabolic panel  Cystitis - Plan: POCT urinalysis dipstick, Urine Microscopic Only, Urine Culture  Acute meniscal tear of left knee, sequela - Plan: Ambulatory referral to Orthopedic Surgery  Dilated aortic root (HCC)  Congestive dilated cardiomyopathy (HCC)  Increased urinary frequency  Acute meniscal tear of knee, left, sequela  White coat syndrome with hypertension Home readings have been consistently < 130/80.  No changes today  Dilated aortic root Stable by most recent ECHO.   Congestive dilated cardiomyopathy EF is normal   Diastolic dysfunction noted.   Increased urinary frequency Repeating UA,micro and culture   Acute meniscal tear of knee, left, sequela Diagnosed by MRI .  Awaiting appointment  with [facility],  Sending to  Adline Mango for second opinion     I discussed the assessment and treatment plan with the patient. The patient was provided an opportunity to ask questions and all were answered. The patient agreed with the plan and demonstrated an understanding of the instructions.   The patient was advised to call back or seek an in-person evaluation if the symptoms worsen or if the condition fails to improve as anticipated.  I provided 30 minutes of non-face-to-face time during this encounter.   Crecencio Mc, MD

## 2019-06-08 NOTE — Assessment & Plan Note (Signed)
Diagnosed by MRI .  Awaiting appointment  with [facility],  Sending to  Adline Mango for second opinion

## 2019-06-08 NOTE — Assessment & Plan Note (Signed)
Home readings have been consistently < 130/80.  No changes today

## 2019-06-08 NOTE — Assessment & Plan Note (Signed)
Repeating UA,micro and culture

## 2019-06-15 ENCOUNTER — Other Ambulatory Visit: Payer: Self-pay

## 2019-06-15 ENCOUNTER — Other Ambulatory Visit (INDEPENDENT_AMBULATORY_CARE_PROVIDER_SITE_OTHER): Payer: PPO

## 2019-06-15 DIAGNOSIS — E782 Mixed hyperlipidemia: Secondary | ICD-10-CM

## 2019-06-15 DIAGNOSIS — N3090 Cystitis, unspecified without hematuria: Secondary | ICD-10-CM

## 2019-06-15 DIAGNOSIS — I10 Essential (primary) hypertension: Secondary | ICD-10-CM

## 2019-06-15 DIAGNOSIS — D709 Neutropenia, unspecified: Secondary | ICD-10-CM

## 2019-06-15 DIAGNOSIS — E559 Vitamin D deficiency, unspecified: Secondary | ICD-10-CM | POA: Diagnosis not present

## 2019-06-15 LAB — LIPID PANEL
Cholesterol: 223 mg/dL — ABNORMAL HIGH (ref 0–200)
HDL: 62.1 mg/dL (ref >39.00)
LDL Cholesterol: 146 mg/dL — ABNORMAL HIGH (ref 0–99)
NonHDL: 160.76
Total CHOL/HDL Ratio: 4
Triglycerides: 76 mg/dL (ref 0.0–149.0)
VLDL: 15.2 mg/dL (ref 0.0–40.0)

## 2019-06-15 LAB — CBC WITH DIFFERENTIAL/PLATELET
Basophils Absolute: 0 10*3/uL (ref 0.0–0.1)
Basophils Relative: 0.4 % (ref 0.0–3.0)
Eosinophils Absolute: 0.1 10*3/uL (ref 0.0–0.7)
Eosinophils Relative: 2.2 % (ref 0.0–5.0)
HCT: 38 % (ref 36.0–46.0)
Hemoglobin: 13.1 g/dL (ref 12.0–15.0)
Lymphocytes Relative: 45.6 % (ref 12.0–46.0)
Lymphs Abs: 2.1 10*3/uL (ref 0.7–4.0)
MCHC: 34.3 g/dL (ref 30.0–36.0)
MCV: 93.4 fl (ref 78.0–100.0)
Monocytes Absolute: 0.4 10*3/uL (ref 0.1–1.0)
Monocytes Relative: 9 % (ref 3.0–12.0)
Neutro Abs: 1.9 10*3/uL (ref 1.4–7.7)
Neutrophils Relative %: 42.8 % — ABNORMAL LOW (ref 43.0–77.0)
Platelets: 218 10*3/uL (ref 150.0–400.0)
RBC: 4.08 Mil/uL (ref 3.87–5.11)
RDW: 13.2 % (ref 11.5–15.5)
WBC: 4.5 10*3/uL (ref 4.0–10.5)

## 2019-06-15 LAB — URINALYSIS, ROUTINE W REFLEX MICROSCOPIC
Bilirubin Urine: NEGATIVE
Ketones, ur: NEGATIVE
Nitrite: POSITIVE — AB
RBC / HPF: NONE SEEN (ref 0–?)
Specific Gravity, Urine: 1.015 (ref 1.000–1.030)
Total Protein, Urine: NEGATIVE
Urine Glucose: NEGATIVE
Urobilinogen, UA: 0.2 (ref 0.0–1.0)
pH: 6 (ref 5.0–8.0)

## 2019-06-15 LAB — COMPREHENSIVE METABOLIC PANEL
ALT: 20 U/L (ref 0–35)
AST: 22 U/L (ref 0–37)
Albumin: 4.3 g/dL (ref 3.5–5.2)
Alkaline Phosphatase: 68 U/L (ref 39–117)
BUN: 15 mg/dL (ref 6–23)
CO2: 26 mEq/L (ref 19–32)
Calcium: 9.4 mg/dL (ref 8.4–10.5)
Chloride: 105 mEq/L (ref 96–112)
Creatinine, Ser: 0.73 mg/dL (ref 0.40–1.20)
GFR: 78.23 mL/min (ref >60.00)
Glucose, Bld: 93 mg/dL (ref 70–99)
Potassium: 3.9 mEq/L (ref 3.5–5.1)
Sodium: 140 mEq/L (ref 135–145)
Total Bilirubin: 0.5 mg/dL (ref 0.2–1.2)
Total Protein: 7.4 g/dL (ref 6.0–8.3)

## 2019-06-15 LAB — TSH: TSH: 1.74 u[IU]/mL (ref 0.35–4.50)

## 2019-06-15 LAB — VITAMIN D 25 HYDROXY (VIT D DEFICIENCY, FRACTURES): VITD: 23.21 ng/mL — ABNORMAL LOW (ref 30.00–100.00)

## 2019-06-17 LAB — URINE CULTURE
MICRO NUMBER:: 829150
SPECIMEN QUALITY:: ADEQUATE

## 2019-06-17 MED ORDER — CEFDINIR 300 MG PO CAPS: 300.0000 mg | ORAL_CAPSULE | Freq: Two times a day (BID) | ORAL | 0 refills | Status: DC

## 2019-06-17 NOTE — Progress Notes (Signed)
LMTCB

## 2019-06-17 NOTE — Progress Notes (Signed)
She has an E Coli UTi that is resistant to everythigng but penicillin and cephalosporin .  I am sending an antibotic called   Cefdinir  to her local pharmacy to take [place] for one week  Daily use of a probiotic advised for 3 weeks.

## 2019-06-17 NOTE — Addendum Note (Signed)
Addended by: Crecencio Mc on: 06/17/2019 10:27 AM   Modules accepted: Orders

## 2019-06-26 ENCOUNTER — Telehealth: Payer: Self-pay | Admitting: Internal Medicine

## 2019-06-26 ENCOUNTER — Encounter: Payer: Self-pay | Admitting: Internal Medicine

## 2019-07-03 DIAGNOSIS — M25562 Pain in left knee: Secondary | ICD-10-CM | POA: Insufficient documentation

## 2019-07-03 DIAGNOSIS — S83242A Other tear of medial meniscus, current injury, left knee, initial encounter: Secondary | ICD-10-CM | POA: Diagnosis not present

## 2019-07-23 ENCOUNTER — Other Ambulatory Visit: Payer: Self-pay

## 2019-07-28 ENCOUNTER — Other Ambulatory Visit: Payer: Self-pay

## 2019-07-28 ENCOUNTER — Ambulatory Visit (INDEPENDENT_AMBULATORY_CARE_PROVIDER_SITE_OTHER): Payer: PPO | Admitting: Internal Medicine

## 2019-07-28 ENCOUNTER — Encounter: Payer: Self-pay | Admitting: Internal Medicine

## 2019-07-28 VITALS — BP 144/82 | HR 75 | Temp 96.1°F | Resp 15 | Ht 65.0 in | Wt 157.0 lb

## 2019-07-28 DIAGNOSIS — Z0000 Encounter for general adult medical examination without abnormal findings: Secondary | ICD-10-CM | POA: Diagnosis not present

## 2019-07-28 DIAGNOSIS — Z780 Asymptomatic menopausal state: Secondary | ICD-10-CM

## 2019-07-28 DIAGNOSIS — S83207S Unspecified tear of unspecified meniscus, current injury, left knee, sequela: Secondary | ICD-10-CM | POA: Diagnosis not present

## 2019-07-28 DIAGNOSIS — Z23 Encounter for immunization: Secondary | ICD-10-CM | POA: Diagnosis not present

## 2019-07-28 DIAGNOSIS — Z1231 Encounter for screening mammogram for malignant neoplasm of breast: Secondary | ICD-10-CM | POA: Diagnosis not present

## 2019-07-28 NOTE — Assessment & Plan Note (Signed)
Awaiting surgical repair but warned that  She already had arthritic changes and would likely need future surgery. She can walk for 20 minutes before the pain sets in .  Her pain has dramatically affected her lifestyle

## 2019-07-28 NOTE — Progress Notes (Signed)
Patient ID: Sally Morgan, female    DOB: 07/01/1946  Age: 73 y.o. MRN: [id number]  The patient is here for annual  examination and preventive  management of other chronic and acute problems.  Annual Mammogram has been scheduled  Flu shot given today Colonoscopy due 2023     The risk factors are reflected in the social history.  The roster of all physicians providing medical care to patient - is listed in the Snapshot section of the chart.  Activities of daily living:  The patient is 100% independent in all ADLs: dressing, toileting, feeding as well as independent mobility  Home safety : The patient has smoke detectors in the home. They wear seatbelts.  There are no firearms at home. There is no violence in the home.   There is no risks for hepatitis, STDs or HIV. There is no   history of blood transfusion. They have no travel history to infectious disease endemic areas of the world.  The patient has seen their dentist in the last six month. They have seen their eye doctor in the last year.  They have deferred audiologic testing in the last year.  They do not  have excessive sun exposure. Discussed the need for sun protection: hats, long sleeves and use of sunscreen if there is significant sun exposure.   Diet: the importance of a healthy diet is discussed. They do have a healthy diet.  The benefits of regular aerobic exercise were discussed. She walks 4 times per week ,  20 minutes.   Depression screen: there are no signs or vegative symptoms of depression- irritability, change in appetite, anhedonia, sadness/tearfullness.  The following portions of the patient's history were reviewed and updated as appropriate: allergies, current medications, past family history, past medical history,  past surgical history, past social history  and problem list.  Visual acuity was not assessed per patient preference since she has regular follow up with her ophthalmologist. Hearing and body mass index  were assessed and reviewed.   During the course of the visit the patient was educated and counseled about appropriate screening and preventive services including : fall prevention , diabetes screening, nutrition counseling, colorectal cancer screening, and recommended immunizations.    CC: The primary encounter diagnosis was Postmenopausal estrogen deficiency. Diagnoses of Need for immunization against influenza, Acute meniscal tear of knee, left, sequela, Encounter for preventive health examination, and Encounter for screening mammogram for breast cancer were also pertinent to this visit.  Left medial knee pain  Since June due to acute menisical tear, recently saw Aluicio,  Meniscal repair was offered, she warned that she wold likely  need more surgery down the road as more OA develops. She has already tried  conservative therapy  With PT and knee injections .  Her knee pain has limited her lifestyle  HTN: home readings have been consistently lower,  Usually  120/75 and at orthopedist's office   brother diagnosed with stage 4 liver cancer at age 25   History Zilah has a past medical history of Aortic atherosclerosis ([place]), Arthritis, Dilated congestive cardiomyopathy ([place]), Diverticulitis, colon, GERD (gastroesophageal reflux disease), and TIA (transient ischemic attack).   She has a past surgical history that includes Cesarean section; colonoscopy with polypectomy; and Colonoscopy with propofol (N/A, 09/18/2017).   Her family history includes Appendicitis in her father; Breast cancer in her cousin; Cancer (age of onset: 79) in her brother; Early death in her father and mother; Tuberculosis in her mother.She reports that she  has never smoked. She has never used smokeless tobacco. She reports current alcohol use of about 4.0 standard drinks of alcohol per week. She reports that she does not use drugs.  Outpatient Medications Prior to Visit  Medication Sig Dispense Refill  . GARLIC OIL PO Take  by mouth.    . losartan (COZAAR) 25 MG tablet TAKE 1 TABLET BY MOUTH EVERY DAY 90 tablet 2  . Multiple Vitamins-Minerals (WOMENS MULTIVITAMIN PO) Take 1 tablet by mouth.    . cefdinir (OMNICEF) 300 MG capsule Take 1 capsule (300 mg total) by mouth 2 (two) times daily. (Patient not taking: Reported on 07/28/2019) 14 capsule 0  . Omega-3 Fatty Acids (FISH OIL) 1000 MG CAPS Take 1 capsule by mouth.     Facility-Administered Medications Prior to Visit  Medication Dose Route Frequency Provider Last Rate Last Dose  . betamethasone acetate-betamethasone sodium phosphate (CELESTONE) injection 3 mg  3 mg Intramuscular Once Daylene Katayama M, DPM      . betamethasone acetate-betamethasone sodium phosphate (CELESTONE) injection 3 mg  3 mg Intramuscular Once Edrick Kins, DPM        Review of Systems   Patient denies headache, fevers, malaise, unintentional weight loss, skin rash, eye pain, sinus congestion and sinus pain, sore throat, dysphagia,  hemoptysis , cough, dyspnea, wheezing, chest pain, palpitations, orthopnea, edema, abdominal pain, nausea, melena, diarrhea, constipation, flank pain, dysuria, hematuria, urinary  Frequency, nocturia, numbness, tingling, seizures,  Focal weakness, Loss of consciousness,  Tremor, insomnia, depression, anxiety, and suicidal ideation.      Objective:  BP (!) 144/82 (BP Location: Left Arm, Patient Position: Sitting, Cuff Size: Normal)   Pulse 75   Temp (!) 96.1 F (35.6 C) (Temporal)   Resp 15   Ht 5\' 5"  (1.651 m)   Wt 157 lb (71.2 kg)   SpO2 94%   BMI 26.13 kg/m   Physical Exam  General appearance: alert, cooperative and appears stated age Head: Normocephalic, without obvious abnormality, atraumatic Eyes: conjunctivae/corneas clear. PERRL, EOM's intact. Fundi benign. Ears: normal TM's and external ear canals both ears Nose: Nares normal. Septum midline. Mucosa normal. No drainage or sinus tenderness. Throat: lips, mucosa, and tongue normal; teeth and  gums normal Neck: no adenopathy, no carotid bruit, no JVD, supple, symmetrical, trachea midline and thyroid not enlarged, symmetric, no tenderness/mass/nodules Lungs: clear to auscultation bilaterally Breasts: normal appearance, no masses or tenderness Heart: regular rate and rhythm, S1, S2 normal, no murmur, click, rub or gallop Abdomen: soft, non-tender; bowel sounds normal; no masses,  no organomegaly Extremities: extremities normal, atraumatic, no cyanosis or edema Pulses: 2+ and symmetric Skin: Skin color, texture, turgor normal. No rashes or lesions Neurologic: Alert and oriented X 3, normal strength and tone. Normal symmetric reflexes. Normal coordination and gait.     Assessment & Plan:   Problem List Items Addressed This Visit      Unprioritized   Encounter for preventive health examination    age appropriate education and counseling updated, referrals for preventative services and immunizations addressed, dietary and smoking counseling addressed, most recent labs reviewed.  I have personally reviewed and have noted:  1) the patient's medical and social history 2) The pt's use of alcohol, tobacco, and illicit drugs 3) The patient's current medications and supplements 4) Functional ability including ADL's, fall risk, home safety risk, hearing and visual impairment 5) Diet and physical activities 6) Evidence for depression or mood disorder 7) The patient's height, weight, and BMI have been recorded in the  chart  I have made referrals, and provided counseling and education based on review of the above      Acute meniscal tear of knee, left, sequela    Awaiting surgical repair but warned that  She already had arthritic changes and would likely need future surgery. She can walk for 20 minutes before the pain sets in .  Her pain has dramatically affected her lifestyle       Encounter for screening mammogram for breast cancer    Breast exam done and  mammogram ordered.         Other Visit Diagnoses    Postmenopausal estrogen deficiency    -  Primary   Relevant Orders   DG Bone Density   Need for immunization against influenza       Relevant Orders   Flu Vaccine QUAD High Dose(Fluad) (Completed)      I have discontinued Jennette Banker. Sones's Fish Oil and cefdinir. I am also having her maintain her Multiple Vitamins-Minerals (WOMENS MULTIVITAMIN PO), losartan, and GARLIC OIL PO. We will continue to administer betamethasone acetate-betamethasone sodium phosphate and betamethasone acetate-betamethasone sodium phosphate.  No orders of the defined types were placed in this encounter.   Medications Discontinued During This Encounter  Medication Reason  . cefdinir (OMNICEF) 300 MG capsule Patient has not taken in last 30 days  . Omega-3 Fatty Acids (FISH OIL) 1000 MG CAPS Patient has not taken in last 30 days    Follow-up: No follow-ups on file.   Crecencio Mc, MD

## 2019-07-28 NOTE — Assessment & Plan Note (Signed)
Breast exam done and mammogram ordered  

## 2019-07-28 NOTE — Patient Instructions (Addendum)
I have ordered your bone density test for Jan 2021  Repeat labs in February 2021   Health Maintenance for Postmenopausal Women Menopause is a normal process in which your ability to get pregnant comes to an end. This process happens slowly over many months or years, usually between the ages of 62 and 10. Menopause is complete when you have missed your menstrual periods for 12 months. It is important to talk with your health care provider about some of the most common conditions that affect women after menopause (postmenopausal women). These include heart disease, cancer, and bone loss (osteoporosis). Adopting a healthy lifestyle and getting preventive care can help to promote your health and wellness. The actions you take can also lower your chances of developing some of these common conditions. What should I know about menopause? During menopause, you may get a number of symptoms, such as:  Hot flashes. These can be moderate or severe.  Night sweats.  Decrease in sex drive.  Mood swings.  Headaches.  Tiredness.  Irritability.  Memory problems.  Insomnia. Choosing to treat or not to treat these symptoms is a decision that you make with your health care provider. Do I need hormone replacement therapy?  Hormone replacement therapy is effective in treating symptoms that are caused by menopause, such as hot flashes and night sweats.  Hormone replacement carries certain risks, especially as you become older. If you are thinking about using estrogen or estrogen with progestin, discuss the benefits and risks with your health care provider. What is my risk for heart disease and stroke? The risk of heart disease, heart attack, and stroke increases as you age. One of the causes may be a change in the body's hormones during menopause. This can affect how your body uses dietary fats, triglycerides, and cholesterol. Heart attack and stroke are medical emergencies. There are many things that you  can do to help prevent heart disease and stroke. Watch your blood pressure  High blood pressure causes heart disease and increases the risk of stroke. This is more likely to develop in people who have high blood pressure readings, are of African descent, or are overweight.  Have your blood pressure checked: ? Every 3-5 years if you are 28-32 years of age. ? Every year if you are 58 years old or older. Eat a healthy diet   Eat a diet that includes plenty of vegetables, fruits, low-fat dairy products, and lean protein.  Do not eat a lot of foods that are high in solid fats, added sugars, or sodium. Get regular exercise Get regular exercise. This is one of the most important things you can do for your health. Most adults should:  Try to exercise for at least 150 minutes each week. The exercise should increase your heart rate and make you sweat (moderate-intensity exercise).  Try to do strengthening exercises at least twice each week. Do these in addition to the moderate-intensity exercise.  Spend less time sitting. Even light physical activity can be beneficial. Other tips  Work with your health care provider to achieve or maintain a healthy weight.  Do not use any products that contain nicotine or tobacco, such as cigarettes, e-cigarettes, and chewing tobacco. If you need help quitting, ask your health care provider.  Know your numbers. Ask your health care provider to check your cholesterol and your blood sugar (glucose). Continue to have your blood tested as directed by your health care provider. Do I need screening for cancer? Depending on your health  history and family history, you may need to have cancer screening at different stages of your life. This may include screening for:  Breast cancer.  Cervical cancer.  Lung cancer.  Colorectal cancer. What is my risk for osteoporosis? After menopause, you may be at increased risk for osteoporosis. Osteoporosis is a condition in  which bone destruction happens more quickly than new bone creation. To help prevent osteoporosis or the bone fractures that can happen because of osteoporosis, you may take the following actions:  If you are 4-39 years old, get at least 1,000 mg of calcium and at least 600 mg of vitamin D per day.  If you are older than age 54 but younger than age 51, get at least 1,200 mg of calcium and at least 600 mg of vitamin D per day.  If you are older than age 9, get at least 1,200 mg of calcium and at least 800 mg of vitamin D per day. Smoking and drinking excessive alcohol increase the risk of osteoporosis. Eat foods that are rich in calcium and vitamin D, and do weight-bearing exercises several times each week as directed by your health care provider. How does menopause affect my mental health? Depression may occur at any age, but it is more common as you become older. Common symptoms of depression include:  Low or sad mood.  Changes in sleep patterns.  Changes in appetite or eating patterns.  Feeling an overall lack of motivation or enjoyment of activities that you previously enjoyed.  Frequent crying spells. Talk with your health care provider if you think that you are experiencing depression. General instructions See your health care provider for regular wellness exams and vaccines. This may include:  Scheduling regular health, dental, and eye exams.  Getting and maintaining your vaccines. These include: ? Influenza vaccine. Get this vaccine each year before the flu season begins. ? Pneumonia vaccine. ? Shingles vaccine. ? Tetanus, diphtheria, and pertussis (Tdap) booster vaccine. Your health care provider may also recommend other immunizations. Tell your health care provider if you have ever been abused or do not feel safe at home. Summary  Menopause is a normal process in which your ability to get pregnant comes to an end.  This condition causes hot flashes, night sweats,  decreased interest in sex, mood swings, headaches, or lack of sleep.  Treatment for this condition may include hormone replacement therapy.  Take actions to keep yourself healthy, including exercising regularly, eating a healthy diet, watching your weight, and checking your blood pressure and blood sugar levels.  Get screened for cancer and depression. Make sure that you are up to date with all your vaccines. This information is not intended to replace advice given to you by your health care provider. Make sure you discuss any questions you have with your health care provider. Document Released: 11/23/2005 Document Revised: 09/24/2018 Document Reviewed: 09/24/2018 Elsevier Patient Education  2020 [employer].

## 2019-07-28 NOTE — Assessment & Plan Note (Signed)

## 2019-11-03 ENCOUNTER — Ambulatory Visit: Payer: PPO | Attending: Internal Medicine

## 2019-11-03 DIAGNOSIS — Z23 Encounter for immunization: Secondary | ICD-10-CM | POA: Insufficient documentation

## 2019-11-03 NOTE — Progress Notes (Signed)
   Covid-19 Vaccination Clinic  Name:  Sally Morgan    MRN: [id number] DOB: [birth year]  11/03/2019  Sally Morgan was observed post Covid-19 immunization for 15 minutes without incidence. She was provided with Vaccine Information Sheet and instruction to access the V-Safe system.   Sally Morgan was instructed to call 911 with any severe reactions post vaccine: Marland Kitchen Difficulty breathing  . Swelling of your face and throat  . A fast heartbeat  . A bad rash all over your body  . Dizziness and weakness    Immunizations Administered    Name Date Dose VIS Date Route   Pfizer COVID-19 Vaccine 11/03/2019  3:47 PM 0.3 mL 09/25/2019 Intramuscular   Manufacturer: [employer], [employer]   Lot: [id number]   [place]: [id number]

## 2019-11-19 ENCOUNTER — Ambulatory Visit (INDEPENDENT_AMBULATORY_CARE_PROVIDER_SITE_OTHER): Payer: PPO

## 2019-11-19 ENCOUNTER — Other Ambulatory Visit: Payer: Self-pay

## 2019-11-19 DIAGNOSIS — I77810 Thoracic aortic ectasia: Secondary | ICD-10-CM | POA: Diagnosis not present

## 2019-11-23 ENCOUNTER — Ambulatory Visit: Payer: PPO | Attending: Internal Medicine

## 2019-11-23 DIAGNOSIS — Z23 Encounter for immunization: Secondary | ICD-10-CM | POA: Insufficient documentation

## 2019-11-23 NOTE — Progress Notes (Signed)
   Covid-19 Vaccination Clinic  Name:  Sally Morgan    MRN: [id number] DOB: January 12, 1946  11/23/2019  [name] was observed post Covid-19 immunization for 15 minutes without incidence. She was provided with Vaccine Information Sheet and instruction to access the V-Safe system.   [name] was instructed to call 911 with any severe reactions post vaccine: Marland Kitchen Difficulty breathing  . Swelling of your face and throat  . A fast heartbeat  . A bad rash all over your body  . Dizziness and weakness    Immunizations Administered    Name Date Dose VIS Date Route   Pfizer COVID-19 Vaccine 11/23/2019  8:26 AM 0.3 mL 09/25/2019 Intramuscular   Manufacturer: [place]   Lot: [id number]   [place]: [id number]

## 2019-11-26 DIAGNOSIS — H353132 Nonexudative age-related macular degeneration, bilateral, intermediate dry stage: Secondary | ICD-10-CM | POA: Diagnosis not present

## 2019-12-22 NOTE — Progress Notes (Signed)
Cardiology Office Note  Date:  12/23/2019   ID:  Sally Morgan, DOB 1945-11-29, MRN [id number]  PCP:  Crecencio Mc, MD   Chief Complaint  Patient presents with  . other    12 month follow up. Meds reviewed by the pt. verbally. "doing well."  Pt. c/o left calf pain when walking.      HPI:  [name]  is a pleasant 74 year-old woman who has a history of  hyperlipidemia,  vein ablation in on the left  post procedure DVT noted on ultrasound.  started on aspirin,  changed to xarelto twice a day after the thrombus extended, for 2 weeks Dilated aortic root 3.7 to 3.9 centimeters Baseline EKG with diffuse T-wave abnormality Mild plaque in the aorta CT scan 2015  She presents for routine followup of her hyperlipidemia and dilated aortic root, aortic atherosclerosis  Good exercise tolerance,  no chest pain or shortness of breath on exertion Walking with her husband QOD Also does Yard work  Did some squats recently, may have done some trauma to left thigh area, tender No leg swelling/edema  Cholesterol running high 220s Does not want statin   previous echocardiograms and CT scans Images pulled up CT scan 2015 CT scan chest 2015 showing ascending aortic dilatation 3.7 cm Mild  aortic arch plaque, minimal descending aorta plaque, no coronary calcification  Echo, numbers reviewed with her today Echo 11/2019: aorta 3.9 cm, reviewed in detail today  stable aortic root 4.1 cm in 2016 echocardiogram 3.5 cm in 2017 Echocardiogram in 2015 showed aortic root 4.2 cm with ejection fraction 45-50%, mildly dilated left ventricle (possible stress cardiomyopathy)  EKG personally reviewed by myself on todays visit Shows normal sinus rhythm with rate 70 bpm, T wave abnormality precordial leads, inferior leads, no change from previous EKGs   PMH:   has a past medical history of Aortic atherosclerosis ([place]), Arthritis, Dilated congestive cardiomyopathy ([place]), Diverticulitis, colon, GERD  (gastroesophageal reflux disease), and TIA (transient ischemic attack).  PSH:    Past Surgical History:  Procedure Laterality Date  . CESAREAN SECTION     x 2   . colonoscopy with polypectomy    . COLONOSCOPY WITH PROPOFOL N/A 09/18/2017   Procedure: COLONOSCOPY WITH PROPOFOL;  Surgeon: Manya Silvas, MD;  Location: [facility] ENDOSCOPY;  Service: Endoscopy;  Laterality: N/A;    Current Outpatient Medications  Medication Sig Dispense Refill  . GARLIC OIL PO Take by mouth.    . losartan (COZAAR) 25 MG tablet TAKE 1 TABLET BY MOUTH EVERY DAY 90 tablet 2  . Multiple Vitamins-Minerals (WOMENS MULTIVITAMIN PO) Take 1 tablet by mouth.     Current Facility-Administered Medications  Medication Dose Route Frequency Provider Last Rate Last Admin  . betamethasone acetate-betamethasone sodium phosphate (CELESTONE) injection 3 mg  3 mg Intramuscular Once Daylene Katayama M, DPM      . betamethasone acetate-betamethasone sodium phosphate (CELESTONE) injection 3 mg  3 mg Intramuscular Once Edrick Kins, DPM         Allergies:   Lisinopril, Meloxicam, and Nickel   Social History:  The patient  reports that she has never smoked. She has never used smokeless tobacco. She reports current alcohol use of about 4.0 standard drinks of alcohol per week. She reports that she does not use drugs.   Family History:   family history includes Appendicitis in her father; Breast cancer in her cousin; Cancer (age of onset: 59) in her brother; Early death in her father and  mother; Tuberculosis in her mother.    Review of Systems: Review of Systems  Constitutional: Negative.   HENT: Negative.   Respiratory: Negative.   Cardiovascular: Negative.   Gastrointestinal: Negative.   Musculoskeletal: Negative.   Neurological: Negative.   Psychiatric/Behavioral: Negative.   All other systems reviewed and are negative.    PHYSICAL EXAM: VS:  BP 130/80 (BP Location: Left Arm, Patient Position: Sitting, Cuff Size:  Normal)   Pulse 70   Ht 5\' 5"  (1.651 m)   Wt 158 lb 8 oz (71.9 kg)   SpO2 99%   BMI 26.38 kg/m  , BMI Body mass index is 26.38 kg/m. Constitutional:  oriented to person, place, and time. No distress.  HENT:  Head: Grossly normal Eyes:  no discharge. No scleral icterus.  Neck: No JVD, no carotid bruits  Cardiovascular: Regular rate and rhythm, no murmurs appreciated Pulmonary/Chest: Clear to auscultation bilaterally, no wheezes or rails Abdominal: Soft.  no distension.  no tenderness.  Musculoskeletal: Normal range of motion Neurological:  normal muscle tone. Coordination normal. No atrophy Skin: Skin warm and dry Psychiatric: normal affect, pleasant   Recent Labs: 06/15/2019: ALT 20; BUN 15; Creatinine, Ser 0.73; Hemoglobin 13.1; Platelets 218.0; Potassium 3.9; Sodium 140; TSH 1.74    Lipid Panel Lab Results  Component Value Date   CHOL 223 (H) 06/15/2019   HDL 62.10 06/15/2019   LDLCALC 146 (H) 06/15/2019   TRIG 76.0 06/15/2019      Wt Readings from Last 3 Encounters:  12/23/19 158 lb 8 oz (71.9 kg)  07/28/19 157 lb (71.2 kg)  10/13/18 161 lb (73 kg)       ASSESSMENT AND PLAN:  Deep venous thrombosis of femoral vein with thrombophlebitis, unspecified laterality (HCC) Occasional leg soreness, overall stable  Aortic atherosclerosis (HCC) Mild plaque noted on CT scan in the aortic arch Does not want to be on a statin No further work-up at this time Recommend she consider CT coronary calcium scoring every 10 years, consider 2025 (10 years from prior CT)  Mixed hyperlipidemia diet, exercise Weight has been stable Does not want a statin/Zetia  Dilated aortic root ([place]) Discussed with her in detail, images reviewed CT scan, echocardiograms Stable aortic root size and ascending aorta 3.9 cm, relatively stable  Congestive dilated cardiomyopathy (HCC)  Ejection fraction 45 to 50% in 2015 Normalized in 2016 Euvolemic, no changes  Abnormal EKG T wave  inversion chronic issue precordial leads, no further work-up at this time  Disposition:   F/U  12 months   Total encounter time more than 25 minutes  Greater than 50% was spent in counseling and coordination of care with the patient    No orders of the defined types were placed in this encounter.    Signed, Esmond Plants, M.D., Ph.D. 12/23/2019  [place], [place]

## 2019-12-23 ENCOUNTER — Ambulatory Visit: Payer: PPO | Admitting: Cardiovascular Disease

## 2019-12-23 ENCOUNTER — Encounter: Payer: Self-pay | Admitting: Cardiovascular Disease

## 2019-12-23 ENCOUNTER — Other Ambulatory Visit: Payer: Self-pay

## 2019-12-23 VITALS — BP 130/80 | HR 70 | Ht 65.0 in | Wt 158.5 lb

## 2019-12-23 DIAGNOSIS — I77810 Thoracic aortic ectasia: Secondary | ICD-10-CM | POA: Diagnosis not present

## 2019-12-23 DIAGNOSIS — E782 Mixed hyperlipidemia: Secondary | ICD-10-CM | POA: Diagnosis not present

## 2019-12-23 DIAGNOSIS — I700 Atherosclerosis of aorta: Secondary | ICD-10-CM

## 2019-12-23 MED ORDER — LOSARTAN POTASSIUM 25 MG PO TABS: 25.0000 mg | ORAL_TABLET | Freq: Every day | ORAL | 3 refills | Status: DC

## 2019-12-23 NOTE — Patient Instructions (Signed)

## 2020-01-11 ENCOUNTER — Other Ambulatory Visit: Payer: Self-pay | Admitting: Internal Medicine

## 2020-01-11 ENCOUNTER — Ambulatory Visit
Admission: RE | Admit: 2020-01-11 | Discharge: 2020-01-11 | Disposition: A | Discharge status: Home / Self Care | Payer: PPO | Source: Ambulatory Visit | Attending: Internal Medicine | Admitting: Internal Medicine

## 2020-01-11 DIAGNOSIS — R928 Other abnormal and inconclusive findings on diagnostic imaging of breast: Secondary | ICD-10-CM

## 2020-01-11 DIAGNOSIS — N6489 Other specified disorders of breast: Secondary | ICD-10-CM

## 2020-01-11 DIAGNOSIS — Z1231 Encounter for screening mammogram for malignant neoplasm of breast: Secondary | ICD-10-CM | POA: Diagnosis not present

## 2020-01-12 ENCOUNTER — Other Ambulatory Visit: Payer: Self-pay | Admitting: Internal Medicine

## 2020-01-12 DIAGNOSIS — N6310 Unspecified lump in the right breast, unspecified quadrant: Secondary | ICD-10-CM

## 2020-01-25 ENCOUNTER — Ambulatory Visit
Admission: RE | Admit: 2020-01-25 | Discharge: 2020-01-25 | Disposition: A | Discharge status: Home / Self Care | Payer: PPO | Source: Ambulatory Visit | Attending: Internal Medicine | Admitting: Internal Medicine

## 2020-01-25 ENCOUNTER — Telehealth: Payer: Self-pay | Admitting: Internal Medicine

## 2020-01-25 DIAGNOSIS — N6489 Other specified disorders of breast: Secondary | ICD-10-CM | POA: Diagnosis not present

## 2020-01-25 DIAGNOSIS — R922 Inconclusive mammogram: Secondary | ICD-10-CM | POA: Diagnosis not present

## 2020-01-25 DIAGNOSIS — R928 Other abnormal and inconclusive findings on diagnostic imaging of breast: Secondary | ICD-10-CM | POA: Insufficient documentation

## 2020-01-25 NOTE — Telephone Encounter (Signed)
Pt thinks she has a UTI and would like a UA. She has frequency to urinate, burning. Please advise

## 2020-01-25 NOTE — Telephone Encounter (Signed)
Spoke with pt and scheduled her for an appt with Dawson Bills, NP tomorrow. Pt is aware of appt date and time.

## 2020-01-25 NOTE — Telephone Encounter (Signed)
LMTCB

## 2020-01-26 ENCOUNTER — Telehealth: Payer: Self-pay | Admitting: Internal Medicine

## 2020-01-26 ENCOUNTER — Ambulatory Visit: Payer: PPO | Admitting: Nurse Practitioner

## 2020-01-26 ENCOUNTER — Other Ambulatory Visit: Payer: Self-pay

## 2020-01-26 ENCOUNTER — Encounter: Payer: Self-pay | Admitting: Nurse Practitioner

## 2020-01-26 VITALS — BP 149/88 | HR 78 | Temp 96.9°F | Resp 15 | Ht 65.0 in | Wt 159.2 lb

## 2020-01-26 DIAGNOSIS — G4700 Insomnia, unspecified: Secondary | ICD-10-CM

## 2020-01-26 DIAGNOSIS — R300 Dysuria: Secondary | ICD-10-CM

## 2020-01-26 LAB — POCT URINALYSIS DIPSTICK
Bilirubin, UA: NEGATIVE
Blood, UA: POSITIVE
Glucose, UA: NEGATIVE
Ketones, UA: NEGATIVE
Nitrite, UA: NEGATIVE
Protein, UA: POSITIVE — AB
Spec Grav, UA: 1.015 (ref 1.010–1.025)
Urobilinogen, UA: 0.2 E.U./dL
pH, UA: 6 (ref 5.0–8.0)

## 2020-01-26 LAB — URINALYSIS, MICROSCOPIC ONLY

## 2020-01-26 MED ORDER — CEPHALEXIN 500 MG PO CAPS: 500.0000 mg | ORAL_CAPSULE | Freq: Two times a day (BID) | ORAL | 0 refills | Status: AC

## 2020-01-26 MED ORDER — NITROFURANTOIN MONOHYD MACRO 100 MG PO CAPS: 100.0000 mg | ORAL_CAPSULE | Freq: Two times a day (BID) | ORAL | 0 refills | Status: DC

## 2020-01-26 NOTE — Telephone Encounter (Signed)
Pt picked up Keflex from drugstore but states that they also gave her Nitrofurantion? She wants a call back to clarify what that is? Please advise

## 2020-01-26 NOTE — Telephone Encounter (Signed)
Which antibiotic is pt supposed to be taking?

## 2020-01-26 NOTE — Patient Instructions (Addendum)
It was nice to see you today.  We have sent urine off a urine culture.  In the meantime, I will treat you with an antibiotic that has been shown to be sensitive to your past urinary tract infections, Keflex.  Let us know if you develop fever, chills, nausea, vomiting, intolerance to medication, abdominal pain back pain.  For your chronic sleep disturbance , I will look into finding a sleep specialist for you.   Urinary Tract Infection, Adult  A urinary tract infection (UTI) is an infection of any part of the urinary tract. The urinary tract includes the kidneys, ureters, bladder, and urethra. These organs make, store, and get rid of urine in the body. Your health care provider may use other names to describe the infection. An upper UTI affects the ureters and kidneys (pyelonephritis). A lower UTI affects the bladder (cystitis) and urethra (urethritis). What are the causes? Most urinary tract infections are caused by bacteria in your genital area, around the entrance to your urinary tract (urethra). These bacteria grow and cause inflammation of your urinary tract. What increases the risk? You are more likely to develop this condition if:  You have a urinary catheter that stays in place (indwelling).  You are not able to control when you urinate or have a bowel movement (you have incontinence).  You are female and you: ? Use a spermicide or diaphragm for birth control. ? Have low estrogen levels. ? Are pregnant.  You have certain genes that increase your risk (genetics).  You are sexually active.  You take antibiotic medicines.  You have a condition that causes your flow of urine to slow down, such as: ? An enlarged prostate, if you are female. ? Blockage in your urethra (stricture). ? A kidney stone. ? A nerve condition that affects your bladder control (neurogenic bladder). ? Not getting enough to drink, or not urinating often.  You have certain medical conditions, such  as: ? Diabetes. ? A weak disease-fighting system (immune system). ? Sickle cell disease. ? Gout. ? Spinal cord injury. What are the signs or symptoms? Symptoms of this condition include:  Needing to urinate right away (urgently).  Frequent urination or passing small amounts of urine frequently.  Pain or burning with urination.  Blood in the urine.  Urine that smells bad or unusual.  Trouble urinating.  Cloudy urine.  Vaginal discharge, if you are female.  Pain in the abdomen or the lower back. You may also have:  Vomiting or a decreased appetite.  Confusion.  Irritability or tiredness.  A fever.  Diarrhea. The first symptom in older adults may be confusion. In some cases, they may not have any symptoms until the infection has worsened. How is this diagnosed? This condition is diagnosed based on your medical history and a physical exam. You may also have other tests, including:  Urine tests.  Blood tests.  Tests for sexually transmitted infections (STIs). If you have had more than one UTI, a cystoscopy or imaging studies may be done to determine the cause of the infections. How is this treated? Treatment for this condition includes:  Antibiotic medicine.  Over-the-counter medicines to treat discomfort.  Drinking enough water to stay hydrated. If you have frequent infections or have other conditions such as a kidney stone, you may need to see a health care provider who specializes in the urinary tract (urologist). In rare cases, urinary tract infections can cause sepsis. Sepsis is a life-threatening condition that occurs when the body responds  to an infection. Sepsis is treated in the hospital with IV antibiotics, fluids, and other medicines. Follow these instructions at home:  Medicines  Take over-the-counter and prescription medicines only as told by your health care provider.  If you were prescribed an antibiotic medicine, take it as told by your health  care provider. Do not stop using the antibiotic even if you start to feel better. General instructions  Make sure you: ? Empty your bladder often and completely. Do not hold urine for long periods of time. ? Empty your bladder after sex. ? Wipe from front to back after a bowel movement if you are female. Use each tissue one time when you wipe.  Drink enough fluid to keep your urine pale yellow.  Keep all follow-up visits as told by your health care provider. This is important. Contact a health care provider if:  Your symptoms do not get better after 1-2 days.  Your symptoms go away and then return. Get help right away if you have:  Severe pain in your back or your lower abdomen.  A fever.  Nausea or vomiting. Summary  A urinary tract infection (UTI) is an infection of any part of the urinary tract, which includes the kidneys, ureters, bladder, and urethra.  Most urinary tract infections are caused by bacteria in your genital area, around the entrance to your urinary tract (urethra).  Treatment for this condition often includes antibiotic medicines.  If you were prescribed an antibiotic medicine, take it as told by your health care provider. Do not stop using the antibiotic even if you start to feel better.  Keep all follow-up visits as told by your health care provider. This is important. This information is not intended to replace advice given to you by your health care provider. Make sure you discuss any questions you have with your health care provider. Document Revised: 09/18/2018 Document Reviewed: 04/10/2018 Elsevier Patient Education  [place]. Findings the last 1 year on discharging her now

## 2020-01-26 NOTE — Telephone Encounter (Signed)
I spoke to Sally Morgan and she is to take the Keflex 500 mg BID x 7 days while we are waiting for her urine culture to return. She voices understanding. We will call her with the urine culture results. She is not using MyChart.  I had canceled the nitrofurantoin 100 mg BID  order placed initially,and decided to treat with Keflex instead d/t the age alert,  but did not notify pharmacy by phone. They filled both medications and the patient was unaware that she was buying 2 antibiotics. She will hold the nitrofurantoin in case she needs it for the future.

## 2020-01-26 NOTE — Progress Notes (Signed)
Established Patient Office Visit  Subjective:  Patient ID: Sally Morgan, female    DOB: 03/26/1946  Age: 74 y.o. MRN: [id number]  CC:  Chief Complaint  Patient presents with  . Acute Visit    urinary frequency, pressure and cloudy urine x1 week    HPI Sally Morgan presents for UTI sx. This is the third one in her lifetime, previously in 2020 and 2019. She has pressure and it almost hurts when she starts to void. She has urgency and frequency and has been up at night and can barely make it to the bathroom. Same sx as when she had E coli UTI. She was trying to apple cider vinegar.    2. Sleep disturbance- for many years and has tried to treat with medication. She falls asleep OK but wakes up at 02:30 and can't sleep the rest of the night. She thinks arthritis wakes her up. No snoring, or apnea. She would like to see a sleep specialist.   Past Medical History:  Diagnosis Date  . Aortic atherosclerosis ([place])   . Arthritis   . Dilated congestive cardiomyopathy ([place])   . Diverticulitis, colon   . GERD (gastroesophageal reflux disease)   . TIA (transient ischemic attack)     Past Surgical History:  Procedure Laterality Date  . CESAREAN SECTION     x 2   . colonoscopy with polypectomy    . COLONOSCOPY WITH PROPOFOL N/A 09/18/2017   Procedure: COLONOSCOPY WITH PROPOFOL;  Surgeon: Manya Silvas, MD;  Location[facility] ENDOSCOPY;  Service: Endoscopy;  Laterality: N/A;    Family History  Problem Relation Age of Onset  . Cancer Brother 61       colon ca  . Early death Mother   . Tuberculosis Mother   . Early death Father   . Appendicitis Father   . Breast cancer Cousin        paternal    Social History   Socioeconomic History  . Marital status: Widowed    Spouse name: Not on file  . Number of children: Not on file  . Years of education: Not on file  . Highest education level: Not on file  Occupational History  . Not on file  Tobacco Use  . Smoking status: Never  Smoker  . Smokeless tobacco: Never Used  Substance and Sexual Activity  . Alcohol use: Yes    Alcohol/week: 4.0 standard drinks    Types: 1 Glasses of wine, 3 Standard drinks or equivalent per week    Comment: OCC  . Drug use: No  . Sexual activity: Not Currently  Other Topics Concern  . Not on file  Social History Narrative  . Not on file   Social Determinants of Health   Financial Resource Strain:   . Difficulty of Paying Living Expenses:   Food Insecurity:   . Worried About Charity fundraiser in the Last Year:   . Arboriculturist in the Last Year:   Transportation Needs:   . Film/video editor (Medical):   Marland Kitchen Lack of Transportation (Non-Medical):   Physical Activity:   . Days of Exercise per Week:   . Minutes of Exercise per Session:   Stress:   . Feeling of Stress :   Social Connections:   . Frequency of Communication with Friends and Family:   . Frequency of Social Gatherings with Friends and Family:   . Attends Religious Services:   . Active Member of Clubs  or Organizations:   . Attends Archivist Meetings:   Marland Kitchen Marital Status:   Intimate Partner Violence:   . Fear of Current or Ex-Partner:   . Emotionally Abused:   Marland Kitchen Physically Abused:   . Sexually Abused:     Outpatient Medications Prior to Visit  Medication Sig Dispense Refill  . GARLIC OIL PO Take by mouth.    . losartan (COZAAR) 25 MG tablet Take 1 tablet (25 mg total) by mouth daily. 90 tablet 3  . Multiple Vitamins-Minerals (WOMENS MULTIVITAMIN PO) Take 1 tablet by mouth.     Facility-Administered Medications Prior to Visit  Medication Dose Route Frequency Provider Last Rate Last Admin  . betamethasone acetate-betamethasone sodium phosphate (CELESTONE) injection 3 mg  3 mg Intramuscular Once Daylene Katayama M, DPM      . betamethasone acetate-betamethasone sodium phosphate (CELESTONE) injection 3 mg  3 mg Intramuscular Once Edrick Kins, DPM        Allergies  Allergen Reactions  .  Lisinopril Cough    cough  . Meloxicam Tinitus    Gastritis and hematuria  . Nickel     ROS Review of Systems  Constitutional: Negative.  Negative for chills and fever.  HENT: Negative for congestion and sore throat.   Eyes: Negative.   Respiratory: Negative for cough and shortness of breath.   Cardiovascular: Negative for chest pain and palpitations.  Gastrointestinal: Negative for abdominal pain, constipation, diarrhea, nausea, rectal pain and vomiting.  Genitourinary: Positive for difficulty urinating, dysuria, frequency and urgency. Negative for flank pain and hematuria.  Musculoskeletal: Arthralgias: ev.       Hips when work outside, fingers not out of the ordinary. It can hurt her at night.   Skin: Negative for rash.  Psychiatric/Behavioral: Negative for agitation.      Objective:    Physical Exam  Constitutional: She is oriented to person, place, and time. She appears well-developed and well-nourished.  Cardiovascular: Normal rate, regular rhythm and normal heart sounds.  Pulmonary/Chest: Effort normal and breath sounds normal.  Abdominal: Soft. There is no abdominal tenderness.  Negative flank pain. No suprapubic tenderness.   Neurological: She is alert and oriented to person, place, and time.  Skin: No rash noted.  Psychiatric: She has a normal mood and affect. Her behavior is normal.    BP (!) 149/88 (BP Location: Left Arm, Patient Position: Sitting, Cuff Size: Normal)   Pulse 78   Temp (!) 96.9 F (36.1 C) (Temporal)   Resp 15   Ht 5\' 5"  (1.651 m)   Wt 159 lb 3.2 oz (72.2 kg)   SpO2 99%   BMI 26.49 kg/m  Wt Readings from Last 3 Encounters:  01/26/20 159 lb 3.2 oz (72.2 kg)  12/23/19 158 lb 8 oz (71.9 kg)  07/28/19 157 lb (71.2 kg)    Health Maintenance Due  Topic Date Due  . TETANUS/TDAP  02/03/2013    There are no preventive care reminders to display for this patient.   Assessment & Plan:   Problem List Items Addressed This Visit    None      Visit Diagnoses    Dysuria    -  Primary   Relevant Orders   POCT Urinalysis Dipstick (Completed)   Urine Culture   Urine Microscopic Only (Completed)   Insomnia, unspecified type       Relevant Orders   Ambulatory referral to Pulmonology      Meds ordered this encounter  Medications  . DISCONTD: nitrofurantoin,  macrocrystal-monohydrate, (MACROBID) 100 MG capsule    Sig: Take 1 capsule (100 mg total) by mouth 2 (two) times daily. Take with food.    Dispense:  10 capsule    Refill:  0    Order Specific Question:   Supervising Provider    Answer:   Einar Pheasant [id number]  . cephALEXin (KEFLEX) 500 MG capsule    Sig: Take 1 capsule (500 mg total) by mouth 2 (two) times daily for 7 days.    Dispense:  14 capsule    Refill:  0    Order Specific Question:   Supervising Provider    Answer:   Einar Pheasant [id number]   We have sent urine off a urine culture.  In the meantime, Dr. Nicki Reaper and I discussed UTI treatment before culture returns and decided to cancel the nitrofurantion and use Keflex instead secondary to her age.   I will treat you with an antibiotic that has been shown to be sensitive to your past urinary tract infections, Keflex. We may need to change the antibiotic if the culture and sensitivity shows that Keflex is resistant. I reviewed this with her and she is agreeable.  Let us know if you develop fever, chills, nausea, vomiting, intolerance to medication, abdominal pain back pain.  For your chronic sleep disturbance , I will look into finding a sleep specialist for you.  Follow-up:  Return if symptoms worsen or fail to improve.    Denice Paradise, NP

## 2020-01-27 ENCOUNTER — Encounter: Payer: Self-pay | Admitting: Nurse Practitioner

## 2020-01-27 NOTE — Telephone Encounter (Signed)
You will

## 2020-01-28 LAB — URINE CULTURE
MICRO NUMBER:: 10357157
SPECIMEN QUALITY:: ADEQUATE

## 2020-04-04 ENCOUNTER — Telehealth: Payer: Self-pay | Admitting: Internal Medicine

## 2020-04-04 ENCOUNTER — Other Ambulatory Visit: Payer: Self-pay

## 2020-04-04 ENCOUNTER — Other Ambulatory Visit (INDEPENDENT_AMBULATORY_CARE_PROVIDER_SITE_OTHER): Payer: PPO

## 2020-04-04 DIAGNOSIS — R300 Dysuria: Secondary | ICD-10-CM | POA: Diagnosis not present

## 2020-04-04 NOTE — Addendum Note (Signed)
Addended by: Leeanne Rio on: 04/04/2020 02:43 PM   Modules accepted: Orders

## 2020-04-04 NOTE — Telephone Encounter (Signed)
Patient thinks she has another UTI. Offered patient a 1pm tomorrow, only appointment available in office, patient has another doctors appt. Elsewhere. She would like to come in and do a urine sample.

## 2020-04-04 NOTE — Telephone Encounter (Signed)
Spoke with pt and she stated that she is having pressure when voiding, frequency, cloudy urine, and odor. Dr. Derrel Nip heard me speaking with the pt and she stated verbally that it is okay with pt to come by and give [place] a urine sample and then schedule an appt with her on Friday at 1pm.

## 2020-04-04 NOTE — Telephone Encounter (Signed)
Spoke with pt and she has been scheduled for both a lab this afternoon and a telephone visit with Dr. Derrel Nip on Friday.

## 2020-04-05 ENCOUNTER — Telehealth: Payer: Self-pay | Admitting: Internal Medicine

## 2020-04-05 ENCOUNTER — Institutional Professional Consult (permissible substitution): Payer: PPO | Admitting: Pulmonary Disease

## 2020-04-05 LAB — URINALYSIS, ROUTINE W REFLEX MICROSCOPIC
Bilirubin, UA: NEGATIVE
Glucose, UA: NEGATIVE
Ketones, UA: NEGATIVE
Nitrite, UA: POSITIVE — AB
Protein,UA: NEGATIVE
Specific Gravity, UA: 1.009 (ref 1.005–1.030)
Urobilinogen, Ur: 0.2 mg/dL (ref 0.2–1.0)
pH, UA: 6.5 (ref 5.0–7.5)

## 2020-04-05 LAB — MICROSCOPIC EXAMINATION
Casts: NONE SEEN /lpf
WBC, UA: >30 /hpf — AB (ref 0–5)

## 2020-04-05 NOTE — Telephone Encounter (Signed)
Pt requesting lab results.

## 2020-04-05 NOTE — Telephone Encounter (Signed)
Pt called about her results from Urine specimen

## 2020-04-05 NOTE — Telephone Encounter (Signed)
Called patient.  She is willing to wait one more day for the culture results.  We will call her on Wednesday to let her know what the results and what antibiotic we are calling in

## 2020-04-06 LAB — URINE CULTURE

## 2020-04-07 ENCOUNTER — Other Ambulatory Visit: Payer: Self-pay | Admitting: Internal Medicine

## 2020-04-07 MED ORDER — CIPROFLOXACIN HCL 250 MG PO TABS: 250.0000 mg | ORAL_TABLET | Freq: Two times a day (BID) | ORAL | 0 refills | Status: DC

## 2020-04-07 NOTE — Progress Notes (Signed)
°  Your labs confirm that you have a UTI, .  I am calling  In the antibiotic  Ciprofloxacin to take twice daily for 5 days  and keep the appointment on Friday.   Please take a probiotic ( Align, Floraque or Culturelle), the generic version of one of these over the counter medications, or an alternative form (kombucha,  Yogurt, or another dietary source) for a minimum of 3 weeks to prevent a serious antibiotic associated diarrhea  Called clostridium dificile colitis.  Taking a probiotic may also prevent vaginitis due to yeast infections and can be continued indefinitely if you feel that it improves your digestion or your elimination (bowels).

## 2020-04-08 ENCOUNTER — Encounter: Payer: Self-pay | Admitting: Internal Medicine

## 2020-04-08 ENCOUNTER — Telehealth (INDEPENDENT_AMBULATORY_CARE_PROVIDER_SITE_OTHER): Payer: PPO | Admitting: Internal Medicine

## 2020-04-08 VITALS — BP 121/81 | Ht 65.0 in | Wt 159.0 lb

## 2020-04-08 DIAGNOSIS — E559 Vitamin D deficiency, unspecified: Secondary | ICD-10-CM | POA: Diagnosis not present

## 2020-04-08 DIAGNOSIS — I10 Essential (primary) hypertension: Secondary | ICD-10-CM

## 2020-04-08 DIAGNOSIS — E782 Mixed hyperlipidemia: Secondary | ICD-10-CM

## 2020-04-08 DIAGNOSIS — N3090 Cystitis, unspecified without hematuria: Secondary | ICD-10-CM

## 2020-04-08 DIAGNOSIS — M8949 Other hypertrophic osteoarthropathy, multiple sites: Secondary | ICD-10-CM | POA: Diagnosis not present

## 2020-04-08 DIAGNOSIS — M159 Polyosteoarthritis, unspecified: Secondary | ICD-10-CM

## 2020-04-08 DIAGNOSIS — R5383 Other fatigue: Secondary | ICD-10-CM | POA: Diagnosis not present

## 2020-04-08 NOTE — Progress Notes (Signed)
Telephone Note  This visit type was conducted due to national recommendations for restrictions regarding the COVID-19 pandemic (e.g. social distancing).  This format is felt to be most appropriate for this patient at this time.  All issues noted in this document were discussed and addressed.  No physical exam was performed (except for noted visual exam findings with Video Visits).   I connected with@ on 04/08/20 at  1:00 PM EDT by telephone and verified that I am speaking with the correct person using two identifiers. Location patient: home Location provider: work or home office Persons participating in the virtual visit: patient, provider  I discussed the limitations, risks, security and privacy concerns of performing an evaluation and management service by telephone and the availability of in person appointments. I also discussed with the patient that there may be a patient responsible charge related to this service. The patient expressed understanding and agreed to proceed.  Reason for visit: recurrent UTI  HPI:  74 yr old female with dysuria ,  Positive urine culture  For  E Coli , pan sensitive    . She started taking ciprofloxain yesterday and is already noting less frequency and dysuria.  Reviewed past year: she has had 3 confirmed UTI's since last August 2020.  One prior to that dec 2019.    Has external hemorrhoids,  Has trouble keeping perineum clean so she cleans several times daily with a washcloth and often notes irritated skin and occasional bleeding due to excessive washing.  Not sexually active.  No vaginal discharge or bleeding.    ROS: See pertinent positives and negatives per HPI.  Past Medical History:  Diagnosis Date  . Aortic atherosclerosis ([place])   . Arthritis   . Dilated congestive cardiomyopathy ([place])   . Diverticulitis, colon   . GERD (gastroesophageal reflux disease)   . TIA (transient ischemic attack)     Past Surgical History:  Procedure Laterality  Date  . CESAREAN SECTION     x 2   . colonoscopy with polypectomy    . COLONOSCOPY WITH PROPOFOL N/A 09/18/2017   Procedure: COLONOSCOPY WITH PROPOFOL;  Surgeon: Manya Silvas, MD;  Location: [facility] ENDOSCOPY;  Service: Endoscopy;  Laterality: N/A;    Family History  Problem Relation Age of Onset  . Cancer Brother 58       colon ca  . Early death Mother   . Tuberculosis Mother   . Early death Father   . Appendicitis Father   . Breast cancer Cousin        paternal    SOCIAL HX:  reports that she has never smoked. She has never used smokeless tobacco. She reports current alcohol use of about 4.0 standard drinks of alcohol per week. She reports that she does not use drugs.  Current Outpatient Medications:  .  ciprofloxacin (CIPRO) 250 MG tablet, Take 1 tablet (250 mg total) by mouth 2 (two) times daily., Disp: 10 tablet, Rfl: 0 .  GARLIC OIL PO, Take by mouth., Disp: , Rfl:  .  losartan (COZAAR) 25 MG tablet, Take 1 tablet (25 mg total) by mouth daily., Disp: 90 tablet, Rfl: 3 .  Multiple Vitamins-Minerals (WOMENS MULTIVITAMIN PO), Take 1 tablet by mouth., Disp: , Rfl:  .  Omega-3 Fatty Acids (FISH OIL) 1000 MG CAPS, Take 1 capsule by mouth daily., Disp: , Rfl:   Current Facility-Administered Medications:  .  betamethasone acetate-betamethasone sodium phosphate (CELESTONE) injection 3 mg, 3 mg, Intramuscular, Once, Edrick Kins, DPM .  betamethasone acetate-betamethasone sodium phosphate (CELESTONE) injection 3 mg, 3 mg, Intramuscular, Once, Evans, Dorathy Daft, DPM  EXAM:  VITALS per patient if applicable:  GENERAL: alert, oriented, appears well and in no acute distress  HEENT: atraumatic, conjunttiva clear, no obvious abnormalities on inspection of external nose and ears  NECK: normal movements of the head and neck  LUNGS: on inspection no signs of respiratory distress, breathing rate appears normal, no obvious gross SOB, gasping or wheezing  CV: no obvious cyanosis  MS:  moves all visible extremities without noticeable abnormality  PSYCH/NEURO: pleasant and cooperative, no obvious depression or anxiety, speech and thought processing grossly intact  ASSESSMENT AND PLAN:  Discussed the following assessment and plan:  White coat syndrome with hypertension - Plan: Comprehensive metabolic panel  Mixed hyperlipidemia - Plan: Lipid panel  Fatigue, unspecified type - Plan: CBC with Differential/Platelet, TSH  Vitamin D deficiency - Plan: VITAMIN D 25 Hydroxy (Vit-D Deficiency, Fractures)  Recurrent cystitis  Primary osteoarthritis involving multiple joints  Recurrent cystitis 3rd UTI in one year,  Responding to Cipro.  Advised to stop using washclothes to clean area as these are too harsh to atrophic skin.  Advised to use bidet or gentle shower stream. Also advised to start cranberry  Tablets daily/  Will add premarin cream if she has another one in the next 3 months   White coat syndrome with hypertension Most recent home reading was today 1210.81 on losartan 25 mg daily . n changes today     Osteoarthritis Patient inquiring about use of turmeric which I have suggested is ok to do. She has a history of NSAID induced gastritis     I discussed the assessment and treatment plan with the patient. The patient was provided an opportunity to ask questions and all were answered. The patient agreed with the plan and demonstrated an understanding of the instructions.   The patient was advised to call back or seek an in-person evaluation if the symptoms worsen or if the condition fails to improve as anticipated.  I provided 30 minutes of non-face-to-face time during this encounter.   Crecencio Mc, MD

## 2020-04-08 NOTE — Assessment & Plan Note (Signed)
Patient inquiring about use of turmeric which I have suggested is ok to do. She has a history of NSAID induced gastritis

## 2020-04-08 NOTE — Assessment & Plan Note (Signed)
3rd UTI in one year,  Responding to Cipro.  Advised to stop using washclothes to clean area as these are too harsh to atrophic skin.  Advised to use bidet or gentle shower stream. Also advised to start cranberry  Tablets daily/  Will add premarin cream if she has another one in the next 3 months

## 2020-04-08 NOTE — Assessment & Plan Note (Signed)
Most recent home reading was today 1210.81 on losartan 25 mg daily . n changes today

## 2020-04-22 ENCOUNTER — Telehealth: Payer: Self-pay | Admitting: Internal Medicine

## 2020-04-22 DIAGNOSIS — F5105 Insomnia due to other mental disorder: Secondary | ICD-10-CM

## 2020-04-22 NOTE — Telephone Encounter (Signed)
Referral to sleep specialist made in April to [employer].  pateint cancelled appt

## 2020-04-22 NOTE — Telephone Encounter (Signed)
Pt cancelled, will call to see if pt wants to r/s BL" [facility] said 17 days ago  "Rejection Reason - Patient Declined" [facility] said about 1 hour ago

## 2020-04-22 NOTE — Assessment & Plan Note (Signed)
patient requested referral to sleep specialist in April during Kim's visit .  She was referred to [facility] but per July correspondence cancelled her appointment

## 2020-05-26 ENCOUNTER — Ambulatory Visit (INDEPENDENT_AMBULATORY_CARE_PROVIDER_SITE_OTHER): Payer: PPO | Admitting: Internal Medicine

## 2020-05-26 ENCOUNTER — Other Ambulatory Visit: Payer: Self-pay

## 2020-05-26 ENCOUNTER — Ambulatory Visit
Admission: RE | Admit: 2020-05-26 | Discharge: 2020-05-26 | Disposition: A | Discharge status: Home / Self Care | Payer: PPO | Source: Ambulatory Visit | Attending: Internal Medicine | Admitting: Internal Medicine

## 2020-05-26 ENCOUNTER — Encounter: Payer: Self-pay | Admitting: Internal Medicine

## 2020-05-26 VITALS — BP 138/84 | HR 86 | Temp 97.7°F | Resp 14 | Ht 65.0 in | Wt 158.2 lb

## 2020-05-26 DIAGNOSIS — G8929 Other chronic pain: Secondary | ICD-10-CM

## 2020-05-26 DIAGNOSIS — N390 Urinary tract infection, site not specified: Secondary | ICD-10-CM

## 2020-05-26 DIAGNOSIS — R1032 Left lower quadrant pain: Secondary | ICD-10-CM

## 2020-05-26 DIAGNOSIS — N3090 Cystitis, unspecified without hematuria: Secondary | ICD-10-CM

## 2020-05-26 DIAGNOSIS — R300 Dysuria: Secondary | ICD-10-CM | POA: Diagnosis not present

## 2020-05-26 DIAGNOSIS — K5730 Diverticulosis of large intestine without perforation or abscess without bleeding: Secondary | ICD-10-CM | POA: Diagnosis not present

## 2020-05-26 DIAGNOSIS — R10A2 Flank pain, left side: Secondary | ICD-10-CM

## 2020-05-26 DIAGNOSIS — R109 Unspecified abdominal pain: Secondary | ICD-10-CM

## 2020-05-26 DIAGNOSIS — I700 Atherosclerosis of aorta: Secondary | ICD-10-CM | POA: Diagnosis not present

## 2020-05-26 DIAGNOSIS — H01001 Unspecified blepharitis right upper eyelid: Secondary | ICD-10-CM | POA: Diagnosis not present

## 2020-05-26 DIAGNOSIS — H01003 Unspecified blepharitis right eye, unspecified eyelid: Secondary | ICD-10-CM | POA: Insufficient documentation

## 2020-05-26 LAB — POCT URINALYSIS DIPSTICK
Bilirubin, UA: NEGATIVE
Blood, UA: POSITIVE
Glucose, UA: NEGATIVE
Ketones, UA: 1
Nitrite, UA: NEGATIVE
Protein, UA: POSITIVE — AB
Spec Grav, UA: 1.015 (ref 1.010–1.025)
Urobilinogen, UA: 0.2 E.U./dL
pH, UA: 7 (ref 5.0–8.0)

## 2020-05-26 LAB — URINALYSIS, MICROSCOPIC ONLY

## 2020-05-26 MED ORDER — SULFAMETHOXAZOLE-TRIMETHOPRIM 800-160 MG PO TABS: 1.0000 | ORAL_TABLET | Freq: Two times a day (BID) | ORAL | 0 refills | Status: DC

## 2020-05-26 NOTE — Progress Notes (Addendum)
Subjective:  Patient ID: Sally Morgan, female    DOB: August 08, 1946  Age: 74 y.o. MRN: [id number]  CC: The primary encounter diagnosis was Dysuria. Diagnoses of Left lower quadrant abdominal pain, Left flank pain, chronic, Recurrent UTI, Recurrent cystitis, and Blepharitis of right upper eyelid, unspecified type were also pertinent to this visit.  HPI Sally Morgan presents for evaluation of bladder pain  \This visit occurred during the SARS-CoV-2 public health emergency.  Safety protocols were in place, including screening questions prior to the visit, additional usage of staff PPE, and extensive cleaning of exam room while observing appropriate contact time as indicated for disinfecting solutions.   Treated for pansensitive E Coli UTI June 24 with cipro.  With complete resolution of symptoms. This is her 4th UTI since December    Recalls having an episode of severe left sided back pain that started in mid June that did not improve at all with NSAIDs and tylenol . The pain lasted for weeks.   Last Friday developed suprapubic pressure and urgency accompanied by urinary hesistancy and a feeling of incomplete voiding while in [place].  After several hours she developed severe urethral pain during a void and suprpubic cramping.  by Saturday morning symptoms resolved, then returned this week.  Has chronic urinary frequency in the morning   Taking cranberry tablets since last UTI  2) right eyelid tender and swollen,  Started this morning.  No discharge   Outpatient Medications Prior to Visit  Medication Sig Dispense Refill  . GARLIC OIL PO Take by mouth.    . losartan (COZAAR) 25 MG tablet Take 1 tablet (25 mg total) by mouth daily. 90 tablet 3  . Multiple Vitamins-Minerals (WOMENS MULTIVITAMIN PO) Take 1 tablet by mouth.    . Omega-3 Fatty Acids (FISH OIL) 1000 MG CAPS Take 1 capsule by mouth daily.    . ciprofloxacin (CIPRO) 250 MG tablet Take 1 tablet (250 mg total) by mouth 2 (two)  times daily. (Patient not taking: Reported on 05/26/2020) 10 tablet 0   Facility-Administered Medications Prior to Visit  Medication Dose Route Frequency Provider Last Rate Last Admin  . betamethasone acetate-betamethasone sodium phosphate (CELESTONE) injection 3 mg  3 mg Intramuscular Once Daylene Katayama M, DPM      . betamethasone acetate-betamethasone sodium phosphate (CELESTONE) injection 3 mg  3 mg Intramuscular Once Edrick Kins, DPM        Review of Systems;  Patient denies headache, fevers, malaise, unintentional weight loss, skin rash, eye pain, sinus congestion and sinus pain, sore throat, dysphagia,  hemoptysis , cough, dyspnea, wheezing, chest pain, palpitations, orthopnea, edema, abdominal pain, nausea, melena, diarrhea, constipation, flank pain, dysuria, hematuria, urinary  Frequency, nocturia, numbness, tingling, seizures,  Focal weakness, Loss of consciousness,  Tremor, insomnia, depression, anxiety, and suicidal ideation.      Objective:  BP 138/84 (BP Location: Left Arm, Patient Position: Sitting, Cuff Size: Normal)   Pulse 86   Temp 97.7 F (36.5 C) (Oral)   Resp 14   Ht 5\' 5"  (1.651 m)   Wt 158 lb 3.2 oz (71.8 kg)   SpO2 96%   BMI 26.33 kg/m   BP Readings from Last 3 Encounters:  05/26/20 138/84  04/08/20 121/81  01/26/20 (!) 149/88    Wt Readings from Last 3 Encounters:  05/26/20 158 lb 3.2 oz (71.8 kg)  04/08/20 159 lb (72.1 kg)  01/26/20 159 lb 3.2 oz (72.2 kg)    General appearance: alert, cooperative and appears  stated age Ears: normal TM's and external ear canals both ears Throat: lips, mucosa, and tongue normal; teeth and gums normal Neck: no adenopathy, no carotid bruit, supple, symmetrical, trachea midline and thyroid not enlarged, symmetric, no tenderness/mass/nodules Back: symmetric, no curvature. ROM normal. No CVA tenderness. Lungs: clear to auscultation bilaterally Heart: regular rate and rhythm, S1, S2 normal, no murmur, click, rub or  gallop Abdomen: soft, non-tender; bowel sounds normal; no masses,  no organomegaly Pulses: 2+ and symmetric Skin: Skin color, texture, turgor normal. No rashes or lesions Lymph nodes: Cervical, supraclavicular, and axillary nodes normal.  No results found for: HGBA1C  Lab Results  Component Value Date   CREATININE 0.73 06/15/2019   CREATININE 0.69 06/12/2018   CREATININE 0.72 05/10/2017    Lab Results  Component Value Date   WBC 4.5 06/15/2019   HGB 13.1 06/15/2019   HCT 38.0 06/15/2019   PLT 218.0 06/15/2019   GLUCOSE 93 06/15/2019   CHOL 223 (H) 06/15/2019   TRIG 76.0 06/15/2019   HDL 62.10 06/15/2019   LDLDIRECT 142.0 05/10/2017   LDLCALC 146 (H) 06/15/2019   ALT 20 06/15/2019   AST 22 06/15/2019   NA 140 06/15/2019   K 3.9 06/15/2019   CL 105 06/15/2019   CREATININE 0.73 06/15/2019   BUN 15 06/15/2019   CO2 26 06/15/2019   TSH 1.74 06/15/2019   MICROALBUR 0.4 11/05/2013    US BREAST LTD UNI RIGHT INC AXILLA  Result Date: 01/25/2020 CLINICAL DATA:  Screening recall for a right breast asymmetry. EXAM: DIGITAL DIAGNOSTIC RIGHT MAMMOGRAM WITH CAD AND TOMO ULTRASOUND RIGHT BREAST COMPARISON:  Previous exam(s). ACR Breast Density Category c: The breast tissue is heterogeneously dense, which may obscure small masses. FINDINGS: In the lateral retroareolar right breast there is a possible obscured oval mass. Mammographic images were processed with CAD. Ultrasound targeted to the retroareolar right breast demonstrates normal fibroglandular tissue. No suspicious masses or areas of shadowing are identified. IMPRESSION: No evidence of right breast malignancy. RECOMMENDATION: Screening mammogram in one year.(Code:SM-B-01Y) I have discussed the findings and recommendations with the patient. If applicable, a reminder letter will be sent to the patient regarding the next appointment. BI-RADS CATEGORY  1: Negative. Electronically Signed   By: Ammie Ferrier M.D.   On: 01/25/2020 14:21     MM DIAG BREAST TOMO UNI RIGHT  Result Date: 01/25/2020 CLINICAL DATA:  Screening recall for a right breast asymmetry. EXAM: DIGITAL DIAGNOSTIC RIGHT MAMMOGRAM WITH CAD AND TOMO ULTRASOUND RIGHT BREAST COMPARISON:  Previous exam(s). ACR Breast Density Category c: The breast tissue is heterogeneously dense, which may obscure small masses. FINDINGS: In the lateral retroareolar right breast there is a possible obscured oval mass. Mammographic images were processed with CAD. Ultrasound targeted to the retroareolar right breast demonstrates normal fibroglandular tissue. No suspicious masses or areas of shadowing are identified. IMPRESSION: No evidence of right breast malignancy. RECOMMENDATION: Screening mammogram in one year.(Code:SM-B-01Y) I have discussed the findings and recommendations with the patient. If applicable, a reminder letter will be sent to the patient regarding the next appointment. BI-RADS CATEGORY  1: Negative. Electronically Signed   By: Ammie Ferrier M.D.   On: 01/25/2020 14:21    Assessment & Plan:   Problem List Items Addressed This Visit      Unprioritized   Blepharitis of eyelid of right eye    Early,  With No signs of bacterial infection presently.. no allergy symptoms either.  advised to use warm compresses and eye wash and contact  us if stye develops       Recurrent cystitis    Given her recent episode of prolonged flank pain, recurrent UTIs  Needs CT to rule out stone,  pyelonephric stranding .  Abscess unlikely given absence of fevers,  N/V but may have a retained stone with biofilm. Empiric Septra to start today,  Cultures pending .  If CT is negative for stones,  pyelonephric stranding , will give patient option of starting Estrace vaginal cream or seeing Urology.  Continue cranberry tablets, probiotics   Addendum:  Culture grew 100K of Enterococcus sensitive to vancomycin>>ampicillin >>>>>macrobid,  And CT was negative for pyelonephritis and stones. Will treat  with augmentin x 1 week         Other Visit Diagnoses    Dysuria    -  Primary   Relevant Orders   POCT Urinalysis Dipstick (Completed)   Urine Culture (Completed)   Urine Microscopic Only (Completed)   CT RENAL STONE STUDY (Completed)   Left lower quadrant abdominal pain       Relevant Orders   CT RENAL STONE STUDY (Completed)   Left flank pain, chronic       Relevant Orders   CT RENAL STONE STUDY (Completed)   Recurrent UTI       Relevant Medications   sulfamethoxazole-trimethoprim (BACTRIM DS) 800-160 MG tablet   Other Relevant Orders   CT RENAL STONE STUDY (Completed)      I provided  30 minutes of  face-to-face time during this encounter reviewing patient's current problems and past surgeries, labs and imaging studies, providing counseling on the above mentioned problems , and coordination  of care .   I have discontinued Lidiya Reise. Kershaw's ciprofloxacin. I am also having her start on sulfamethoxazole-trimethoprim and amoxicillin-clavulanate. Additionally, I am having her maintain her Multiple Vitamins-Minerals (WOMENS MULTIVITAMIN PO), GARLIC OIL PO, losartan, and Fish Oil. We will continue to administer betamethasone acetate-betamethasone sodium phosphate and betamethasone acetate-betamethasone sodium phosphate.  Meds ordered this encounter  Medications  . sulfamethoxazole-trimethoprim (BACTRIM DS) 800-160 MG tablet    Sig: Take 1 tablet by mouth 2 (two) times daily.    Dispense:  10 tablet    Refill:  0  . amoxicillin-clavulanate (AUGMENTIN) 875-125 MG tablet    Sig: Take 1 tablet by mouth 2 (two) times daily.    Dispense:  14 tablet    Refill:  0    Medications Discontinued During This Encounter  Medication Reason  . ciprofloxacin (CIPRO) 250 MG tablet Completed Course    Follow-up: No follow-ups on file.   Crecencio Mc, MD

## 2020-05-26 NOTE — Patient Instructions (Signed)
I am prescribing Septra this time.    CT scan ordered.  You may have to go to [facility] to get it done before the weekend  The right eyelid is enflamed.  Use warm compresses and if it worsens let me know

## 2020-05-26 NOTE — Assessment & Plan Note (Addendum)
Given her recent episode of prolonged flank pain, recurrent UTIs  Needs CT to rule out stone,  pyelonephric stranding .  Abscess unlikely given absence of fevers,  N/V but may have a retained stone with biofilm. Empiric Septra to start today,  Cultures pending .  If CT is negative for stones,  pyelonephric stranding , will give patient option of starting Estrace vaginal cream or seeing Urology.  Continue cranberry tablets, probiotics   Addendum:  Culture grew 100K of Enterococcus sensitive to vancomycin>>ampicillin >>>>>macrobid,  And CT was negative for pyelonephritis and stones. Will treat with augmentin x 1 week

## 2020-05-26 NOTE — Assessment & Plan Note (Signed)
Early,  With No signs of bacterial infection presently.. no allergy symptoms either.  advised to use warm compresses and eye wash and contact us if stye develops

## 2020-05-26 NOTE — Addendum Note (Signed)
Addended by: Crecencio Mc on: 05/26/2020 02:14 PM   Modules accepted: Orders

## 2020-05-27 ENCOUNTER — Telehealth: Payer: Self-pay

## 2020-05-27 NOTE — Telephone Encounter (Signed)
Pt states that she is not going to be home today to get CT results and she would like a call on her cell [phone]

## 2020-05-27 NOTE — Telephone Encounter (Signed)
please let patient know that the CT was normal.

## 2020-05-27 NOTE — Telephone Encounter (Signed)
[place] [facility] called for stat CT results. Results are as followed.  No renal or ureteral stones.  No hydronephrosis.  Left colonic diverticulosis.  No active diverticulitis.  Aortic atherosclerosis.  No acute findings

## 2020-05-27 NOTE — Telephone Encounter (Signed)
Spoke with pt to let her know that CT scan was normal. Pt gave a verbal understanding.

## 2020-05-28 LAB — URINE CULTURE
MICRO NUMBER:: 10819208
SPECIMEN QUALITY:: ADEQUATE

## 2020-05-31 ENCOUNTER — Telehealth: Payer: Self-pay

## 2020-05-31 MED ORDER — AMOXICILLIN-POT CLAVULANATE 875-125 MG PO TABS: 1.0000 | ORAL_TABLET | Freq: Two times a day (BID) | ORAL | 0 refills | Status: DC

## 2020-05-31 NOTE — Addendum Note (Signed)
Addended by: Crecencio Mc on: 05/31/2020 09:05 AM   Modules accepted: Orders

## 2020-05-31 NOTE — Telephone Encounter (Signed)
LMTCB in regards to lab results.  

## 2020-06-03 ENCOUNTER — Telehealth: Payer: Self-pay

## 2020-06-03 ENCOUNTER — Other Ambulatory Visit: Payer: Self-pay | Admitting: Internal Medicine

## 2020-06-03 DIAGNOSIS — N3090 Cystitis, unspecified without hematuria: Secondary | ICD-10-CM

## 2020-06-03 NOTE — Progress Notes (Signed)
If you are  having watery loose stools , you should be  scheduled for a lab visit to submit a stool sample to rule out c dificile colitis.   If she is not having watery diarrhea  and do not start the antibiotic, then I recommend retesting your urine

## 2020-06-03 NOTE — Telephone Encounter (Signed)
LMTCB in regards to lab results.  

## 2020-06-06 ENCOUNTER — Ambulatory Visit (INDEPENDENT_AMBULATORY_CARE_PROVIDER_SITE_OTHER): Payer: PPO

## 2020-06-06 VITALS — Ht 65.0 in | Wt 158.0 lb

## 2020-06-06 DIAGNOSIS — Z0000 Encounter for general adult medical examination without abnormal findings: Secondary | ICD-10-CM | POA: Diagnosis not present

## 2020-06-06 NOTE — Patient Instructions (Addendum)
Sally Morgan , Thank you for taking time to come for your Medicare Wellness Visit. I appreciate your ongoing commitment to your health goals. Please review the following plan we discussed and let me know if I can assist you in the future.   These are the goals we discussed: Goals    . Follow up with Primary Care Provider as needed       This is a list of the screening recommended for you and due dates:  Health Maintenance  Topic Date Due  . Tetanus Vaccine  02/03/2013  . Flu Shot  05/15/2020  . Mammogram  01/10/2021  . Colon Cancer Screening  09/18/2022  . DEXA scan (bone density measurement)  Completed  . COVID-19 Vaccine  Completed  .  Hepatitis C: One time screening is recommended by Center for Disease Control  (CDC) for  adults born from [birth year] through 1964.   Completed    Immunizations Immunization History  Administered Date(s) Administered  . Fluad Quad(high Dose 65+) 07/28/2019  . Influenza Whole 08/06/2010  . Influenza-Unspecified 09/13/2018  . PFIZER SARS-COV-2 Vaccination 11/03/2019, 11/23/2019  . Pneumococcal Conjugate-13 11/02/2013  . Pneumococcal Polysaccharide-23 12/08/2015  . Td 02/04/2003   Advanced directives: yes  Conditions/risks identified: none new  Follow up in one year for your annual wellness visit.   Preventive Care 52 Years and Older, Female Preventive care refers to lifestyle choices and visits with your health care provider that can promote health and wellness. What does preventive care include?  A yearly physical exam. This is also called an annual well check.  Dental exams once or twice a year.  Routine eye exams. Ask your health care provider how often you should have your eyes checked.  Personal lifestyle choices, including:  Daily care of your teeth and gums.  Regular physical activity.  Eating a healthy diet.  Avoiding tobacco and drug use.  Limiting alcohol use.  Practicing safe sex.  Taking low-dose aspirin every  day.  Taking vitamin and mineral supplements as recommended by your health care provider. What happens during an annual well check? The services and screenings done by your health care provider during your annual well check will depend on your age, overall health, lifestyle risk factors, and family history of disease. Counseling  Your health care provider may ask you questions about your:  Alcohol use.  Tobacco use.  Drug use.  Emotional well-being.  Home and relationship well-being.  Sexual activity.  Eating habits.  History of falls.  Memory and ability to understand (cognition).  Work and work Statistician.  Reproductive health. Screening  You may have the following tests or measurements:  Height, weight, and BMI.  Blood pressure.  Lipid and cholesterol levels. These may be checked every 5 years, or more frequently if you are over 42 years old.  Skin check.  Lung cancer screening. You may have this screening every year starting at age 63 if you have a 30-pack-year history of smoking and currently smoke or have quit within the past 15 years.  Fecal occult blood test (FOBT) of the stool. You may have this test every year starting at age 22.  Flexible sigmoidoscopy or colonoscopy. You may have a sigmoidoscopy every 5 years or a colonoscopy every 10 years starting at age 61.  Hepatitis C blood test.  Hepatitis B blood test.  Sexually transmitted disease (STD) testing.  Diabetes screening. This is done by checking your blood sugar (glucose) after you have not eaten for a while (fasting).  You may have this done every 1-3 years.  Bone density scan. This is done to screen for osteoporosis. You may have this done starting at age 62.  Mammogram. This may be done every 1-2 years. Talk to your health care provider about how often you should have regular mammograms. Talk with your health care provider about your test results, treatment options, and if necessary, the need  for more tests. Vaccines  Your health care provider may recommend certain vaccines, such as:  Influenza vaccine. This is recommended every year.  Tetanus, diphtheria, and acellular pertussis (Tdap, Td) vaccine. You may need a Td booster every 10 years.  Zoster vaccine. You may need this after age 88.  Pneumococcal 13-valent conjugate (PCV13) vaccine. One dose is recommended after age 87.  Pneumococcal polysaccharide (PPSV23) vaccine. One dose is recommended after age 48. Talk to your health care provider about which screenings and vaccines you need and how often you need them. This information is not intended to replace advice given to you by your health care provider. Make sure you discuss any questions you have with your health care provider. Document Released: 10/28/2015 Document Revised: 06/20/2016 Document Reviewed: 08/02/2015 Elsevier Interactive Patient Education  2017 [place] Prevention in the Home Falls can cause injuries. They can happen to people of all ages. There are many things you can do to make your home safe and to help prevent falls. What can I do on the outside of my home?  Regularly fix the edges of walkways and driveways and fix any cracks.  Remove anything that might make you trip as you walk through a door, such as a raised step or threshold.  Trim any bushes or trees on the path to your home.  Use bright outdoor lighting.  Clear any walking paths of anything that might make someone trip, such as rocks or tools.  Regularly check to see if handrails are loose or broken. Make sure that both sides of any steps have handrails.  Any raised decks and porches should have guardrails on the edges.  Have any leaves, snow, or ice cleared regularly.  Use sand or salt on walking paths during winter.  Clean up any spills in your garage right away. This includes oil or grease spills. What can I do in the bathroom?  Use night lights.  Install grab bars  by the toilet and in the tub and shower. Do not use towel bars as grab bars.  Use non-skid mats or decals in the tub or shower.  If you need to sit down in the shower, use a plastic, non-slip stool.  Keep the floor dry. Clean up any water that spills on the floor as soon as it happens.  Remove soap buildup in the tub or shower regularly.  Attach bath mats securely with double-sided non-slip rug tape.  Do not have throw rugs and other things on the floor that can make you trip. What can I do in the bedroom?  Use night lights.  Make sure that you have a light by your bed that is easy to reach.  Do not use any sheets or blankets that are too big for your bed. They should not hang down onto the floor.  Have a firm chair that has side arms. You can use this for support while you get dressed.  Do not have throw rugs and other things on the floor that can make you trip. What can I do in the kitchen?  Clean up any spills right away.  Avoid walking on wet floors.  Keep items that you use a lot in easy-to-reach places.  If you need to reach something above you, use a strong step stool that has a grab bar.  Keep electrical cords out of the way.  Do not use floor polish or wax that makes floors slippery. If you must use wax, use non-skid floor wax.  Do not have throw rugs and other things on the floor that can make you trip. What can I do with my stairs?  Do not leave any items on the stairs.  Make sure that there are handrails on both sides of the stairs and use them. Fix handrails that are broken or loose. Make sure that handrails are as long as the stairways.  Check any carpeting to make sure that it is firmly attached to the stairs. Fix any carpet that is loose or worn.  Avoid having throw rugs at the top or bottom of the stairs. If you do have throw rugs, attach them to the floor with carpet tape.  Make sure that you have a light switch at the top of the stairs and the  bottom of the stairs. If you do not have them, ask someone to add them for you. What else can I do to help prevent falls?  Wear shoes that:  Do not have high heels.  Have rubber bottoms.  Are comfortable and fit you well.  Are closed at the toe. Do not wear sandals.  If you use a stepladder:  Make sure that it is fully opened. Do not climb a closed stepladder.  Make sure that both sides of the stepladder are locked into place.  Ask someone to hold it for you, if possible.  Clearly mark and make sure that you can see:  Any grab bars or handrails.  First and last steps.  Where the edge of each step is.  Use tools that help you move around (mobility aids) if they are needed. These include:  Canes.  Walkers.  Scooters.  Crutches.  Turn on the lights when you go into a dark area. Replace any light bulbs as soon as they burn out.  Set up your furniture so you have a clear path. Avoid moving your furniture around.  If any of your floors are uneven, fix them.  If there are any pets around you, be aware of where they are.  Review your medicines with your doctor. Some medicines can make you feel dizzy. This can increase your chance of falling. Ask your doctor what other things that you can do to help prevent falls. This information is not intended to replace advice given to you by your health care provider. Make sure you discuss any questions you have with your health care provider. Document Released: 07/28/2009 Document Revised: 03/08/2016 Document Reviewed: 11/05/2014 Elsevier Interactive Patient Education  2017 [employer].

## 2020-06-06 NOTE — Progress Notes (Signed)
Subjective:   Sally Morgan is a 74 y.o. female who presents for Medicare Annual (Subsequent) preventive examination.  Review of Systems    No ROS.  Medicare Wellness Virtual Visit.    Cardiac Risk Factors include: advanced age (>86men, >56 women)     Objective:    Today's Vitals   06/06/20 0905  Weight: 158 lb (71.7 kg)  Height: 5\' 5"  (1.651 m)   Body mass index is 26.29 kg/m.  Advanced Directives 06/06/2020 06/04/2019 04/09/2018 09/18/2017 04/08/2017 10/12/2015  Does Patient Have a Medical Advance Directive? Yes Yes Yes Yes Yes Yes  Type of Paramedic of [place];Living will [place];Living will [place];Living will Living will Living will;Healthcare Power of [place];Living will  Does patient want to make changes to medical advance directive? No - Patient declined No - Patient declined No - Patient declined - No - Patient declined No - Patient declined  Copy of [place] in Chart? No - copy requested No - copy requested No - copy requested - No - copy requested No - copy requested    Current Medications (verified) Outpatient Encounter Medications as of 06/06/2020  Medication Sig  . amoxicillin-clavulanate (AUGMENTIN) 875-125 MG tablet Take 1 tablet by mouth 2 (two) times daily.  Marland Kitchen GARLIC OIL PO Take by mouth.  . losartan (COZAAR) 25 MG tablet Take 1 tablet (25 mg total) by mouth daily.  . Multiple Vitamins-Minerals (WOMENS MULTIVITAMIN PO) Take 1 tablet by mouth.  . Omega-3 Fatty Acids (FISH OIL) 1000 MG CAPS Take 1 capsule by mouth daily.  Marland Kitchen sulfamethoxazole-trimethoprim (BACTRIM DS) 800-160 MG tablet Take 1 tablet by mouth 2 (two) times daily.   Facility-Administered Encounter Medications as of 06/06/2020  Medication  . betamethasone acetate-betamethasone sodium phosphate (CELESTONE) injection 3 mg  . betamethasone acetate-betamethasone sodium phosphate  (CELESTONE) injection 3 mg    Allergies (verified) Lisinopril, Meloxicam, and Nickel   History: Past Medical History:  Diagnosis Date  . Aortic atherosclerosis ([place])   . Arthritis   . Dilated congestive cardiomyopathy ([place])   . Diverticulitis, colon   . GERD (gastroesophageal reflux disease)   . TIA (transient ischemic attack)    Past Surgical History:  Procedure Laterality Date  . CESAREAN SECTION     x 2   . colonoscopy with polypectomy    . COLONOSCOPY WITH PROPOFOL N/A 09/18/2017   Procedure: COLONOSCOPY WITH PROPOFOL;  Surgeon: Manya Silvas, MD;  Location: [facility] ENDOSCOPY;  Service: Endoscopy;  Laterality: N/A;   Family History  Problem Relation Age of Onset  . Cancer Brother 59       colon ca  . Early death Mother   . Tuberculosis Mother   . Early death Father   . Appendicitis Father   . Breast cancer Cousin        paternal   Social History   Socioeconomic History  . Marital status: Widowed    Spouse name: Not on file  . Number of children: Not on file  . Years of education: Not on file  . Highest education level: Not on file  Occupational History  . Not on file  Tobacco Use  . Smoking status: Never Smoker  . Smokeless tobacco: Never Used  Vaping Use  . Vaping Use: Never used  Substance and Sexual Activity  . Alcohol use: Yes    Alcohol/week: 4.0 standard drinks    Types: 1 Glasses of wine, 3 Standard  drinks or equivalent per week    Comment: OCC  . Drug use: No  . Sexual activity: Not Currently  Other Topics Concern  . Not on file  Social History Narrative  . Not on file   Social Determinants of Health   Financial Resource Strain: Low Risk   . Difficulty of Paying Living Expenses: Not hard at all  Food Insecurity: No Food Insecurity  . Worried About Charity fundraiser in the Last Year: Never true  . Ran Out of Food in the Last Year: Never true  Transportation Needs: No Transportation Needs  . Lack of Transportation (Medical): No  .  Lack of Transportation (Non-Medical): No  Physical Activity: Insufficiently Active  . Days of Exercise per Week: 3 days  . Minutes of Exercise per Session: 30 min  Stress: No Stress Concern Present  . Feeling of Stress : Not at all  Social Connections: Unknown  . Frequency of Communication with Friends and Family: Not on file  . Frequency of Social Gatherings with Friends and Family: Not on file  . Attends Religious Services: Not on file  . Active Member of Clubs or Organizations: Not on file  . Attends Archivist Meetings: Not on file  . Marital Status: Married    Tobacco Counseling Counseling given: Not Answered   Clinical Intake:  Pre-visit preparation completed: Yes        Diabetes: No  How often do you need to have someone help you when you read instructions, pamphlets, or other written materials from your doctor or pharmacy?: 1 - Never  Interpreter Needed?: No      Activities of Daily Living In your present state of health, do you have any difficulty performing the following activities: 06/06/2020  Hearing? N  Vision? N  Difficulty concentrating or making decisions? N  Walking or climbing stairs? N  Dressing or bathing? N  Doing errands, shopping? N  Preparing Food and eating ? N  Using the Toilet? N  In the past six months, have you accidently leaked urine? N  Do you have problems with loss of bowel control? N  Managing your Medications? N  Managing your Finances? N  Housekeeping or managing your Housekeeping? N  Some recent data might be hidden    Patient Care Team: Crecencio Mc, MD as PCP - General (Internal Medicine) Minna Merritts, MD as Consulting Physician (Cardiology)  Indicate any recent Medical Services you may have received from other than [facility] providers in the past year (date may be approximate).     Assessment:   This is a routine wellness examination for Sally Morgan.  I connected with Phil today by telephone and  verified that I am speaking with the correct person using two identifiers. Location patient: home Location provider: work Persons participating in the virtual visit: patient, Marine scientist.    I discussed the limitations, risks, security and privacy concerns of performing an evaluation and management service by telephone and the availability of in person appointments. The patient expressed understanding and verbally consented to this telephonic visit.    Interactive audio and video telecommunications were attempted between this provider and patient, however failed, due to patient having technical difficulties OR patient did not have access to video capability.  We continued and completed visit with audio only.  Some vital signs may be absent or patient reported.   Hearing/Vision screen  Hearing Screening   125Hz  250Hz  500Hz  1000Hz  2000Hz  3000Hz  4000Hz  6000Hz  8000Hz   Right ear:  Left ear:           Comments: Patient is able to hear conversational tones without difficulty.  No issues reported.  Vision Screening Comments: Wears corrective lenses when reading  Age related macular degeneration  Visual acuity not assessed, virtual visit.  Dietary issues and exercise activities discussed: Current Exercise Habits: Home exercise routine, Type of exercise: walking, Time (Minutes): 30, Frequency (Times/Week): 4, Weekly Exercise (Minutes/Week): 120, Intensity: Mild  Goals    . Follow up with Primary Care Provider as needed      Depression Screen PHQ 2/9 Scores 06/06/2020 06/04/2019 03/30/2019 04/09/2018 04/08/2017 10/12/2015 11/03/2013  PHQ - 2 Score 0 0 0 0 0 0 0  PHQ- 9 Score - - 0 - 0 - -    Fall Risk Fall Risk  06/06/2020 05/26/2020 04/08/2020 01/26/2020 07/28/2019  Falls in the past year? 0 0 0 0 0  Number falls in past yr: 0 - - - -  Injury with Fall? - - - - -  Follow up Falls evaluation completed Falls evaluation completed Falls evaluation completed Falls evaluation completed Falls  evaluation completed   Handrails in use when climbing stairs? Yes Home free of loose throw rugs in walkways, pet beds, electrical cords, etc? Yes  Adequate lighting in your home to reduce risk of falls? Yes   ASSISTIVE DEVICES UTILIZED TO PREVENT FALLS:  Life alert? No  Use of a cane, walker or w/c? No  Grab bars in the bathroom? No  Shower chair or bench in shower? No  Elevated toilet seat or a handicapped toilet? Yes   TIMED UP AND GO:  Was the test performed? No . Virtual visit.   Cognitive Function: Patient is alert and oriented x3.  Denies difficulty focusing, making decisions, memory loss.  MMSE - Mini Mental State Exam 04/09/2018 04/08/2017 10/12/2015  Orientation to time 5 5 5   Orientation to Place 5 5 5   Registration 3 3 3   Attention/ Calculation 5 5 5   Recall 2 3 3   Language- name 2 objects 2 2 2   Language- repeat 1 1 1   Language- follow 3 step command 3 3 3   Language- read & follow direction 1 1 1   Write a sentence 1 1 1   Copy design 1 1 1   Total score 29 30 30      6CIT Screen 06/04/2019  What Year? 0 points  What month? 0 points  What time? 0 points  Count back from 20 0 points  Months in reverse 0 points  Repeat phrase 0 points  Total Score 0    Immunizations Immunization History  Administered Date(s) Administered  . Fluad Quad(high Dose 65+) 07/28/2019  . Influenza Whole 08/06/2010  . Influenza-Unspecified 09/13/2018  . PFIZER SARS-COV-2 Vaccination 11/03/2019, 11/23/2019  . Pneumococcal Conjugate-13 11/02/2013  . Pneumococcal Polysaccharide-23 12/08/2015  . Td 02/04/2003    TDAP status: Due, Education has been provided regarding the importance of this vaccine. Advised may receive this vaccine at local pharmacy or Health Dept. Aware to provide a copy of the vaccination record if obtained from local pharmacy or Health Dept. Verbalized acceptance and understanding. Deferred.   Health Maintenance Health Maintenance  Topic Date Due  . TETANUS/TDAP   02/03/2013  . INFLUENZA VACCINE  05/15/2020  . MAMMOGRAM  01/10/2021  . COLONOSCOPY  09/18/2022  . DEXA SCAN  Completed  . COVID-19 Vaccine  Completed  . Hepatitis C Screening  Completed     Dental Screening: Recommended annual dental exams for proper  oral hygiene. Visits every 6 months.   Community Resource Referral / Chronic Care Management: CRR required this visit?  No   CCM required this visit?  No      Plan:   Keep all routine maintenance appointments.   I have personally reviewed and noted the following in the patient's chart:   . Medical and social history . Use of alcohol, tobacco or illicit drugs  . Current medications and supplements . Functional ability and status . Nutritional status . Physical activity . Advanced directives . List of other physicians . Hospitalizations, surgeries, and ER visits in previous 12 months . Vitals . Screenings to include cognitive, depression, and falls . Referrals and appointments  In addition, I have reviewed and discussed with patient certain preventive protocols, quality metrics, and best practice recommendations. A written personalized care plan for preventive services as well as general preventive health recommendations were provided to patient via mail.   Nurse notes: Patient notes if she needs to go to Urology at some point due to repeat bladder infections, she would prefer Dr. Nickolas Madrid.   Varney Biles, LPN   1/60/1093

## 2020-06-08 ENCOUNTER — Encounter: Payer: Self-pay | Admitting: Urology

## 2020-06-08 ENCOUNTER — Other Ambulatory Visit: Payer: Self-pay

## 2020-06-08 ENCOUNTER — Ambulatory Visit: Payer: PPO | Admitting: Urology

## 2020-06-08 VITALS — BP 145/86 | HR 81 | Ht 65.0 in | Wt 155.0 lb

## 2020-06-08 DIAGNOSIS — N3090 Cystitis, unspecified without hematuria: Secondary | ICD-10-CM | POA: Diagnosis not present

## 2020-06-08 DIAGNOSIS — N390 Urinary tract infection, site not specified: Secondary | ICD-10-CM

## 2020-06-08 NOTE — Patient Instructions (Signed)
1.  Take cranberry tablets twice daily to help prevent urinary tract infections 2.  Use Premarin cream 3 times a week(Monday Wednesday Friday) to prevent infections  Urinary Tract Infection, Adult  A urinary tract infection (UTI) is an infection of any part of the urinary tract. The urinary tract includes the kidneys, ureters, bladder, and urethra. These organs make, store, and get rid of urine in the body. Your health care provider may use other names to describe the infection. An upper UTI affects the ureters and kidneys (pyelonephritis). A lower UTI affects the bladder (cystitis) and urethra (urethritis). What are the causes? Most urinary tract infections are caused by bacteria in your genital area, around the entrance to your urinary tract (urethra). These bacteria grow and cause inflammation of your urinary tract. What increases the risk? You are more likely to develop this condition if:  You have a urinary catheter that stays in place (indwelling).  You are not able to control when you urinate or have a bowel movement (you have incontinence).  You are female and you: ? Use a spermicide or diaphragm for birth control. ? Have low estrogen levels. ? Are pregnant.  You have certain genes that increase your risk (genetics).  You are sexually active.  You take antibiotic medicines.  You have a condition that causes your flow of urine to slow down, such as: ? An enlarged prostate, if you are female. ? Blockage in your urethra (stricture). ? A kidney stone. ? A nerve condition that affects your bladder control (neurogenic bladder). ? Not getting enough to drink, or not urinating often.  You have certain medical conditions, such as: ? Diabetes. ? A weak disease-fighting system (immunesystem). ? Sickle cell disease. ? Gout. ? Spinal cord injury. What are the signs or symptoms? Symptoms of this condition include:  Needing to urinate right away (urgently).  Frequent urination or  passing small amounts of urine frequently.  Pain or burning with urination.  Blood in the urine.  Urine that smells bad or unusual.  Trouble urinating.  Cloudy urine.  Vaginal discharge, if you are female.  Pain in the abdomen or the lower back. You may also have:  Vomiting or a decreased appetite.  Confusion.  Irritability or tiredness.  A fever.  Diarrhea. The first symptom in older adults may be confusion. In some cases, they may not have any symptoms until the infection has worsened. How is this diagnosed? This condition is diagnosed based on your medical history and a physical exam. You may also have other tests, including:  Urine tests.  Blood tests.  Tests for sexually transmitted infections (STIs). If you have had more than one UTI, a cystoscopy or imaging studies may be done to determine the cause of the infections. How is this treated? Treatment for this condition includes:  Antibiotic medicine.  Over-the-counter medicines to treat discomfort.  Drinking enough water to stay hydrated. If you have frequent infections or have other conditions such as a kidney stone, you may need to see a health care provider who specializes in the urinary tract (urologist). In rare cases, urinary tract infections can cause sepsis. Sepsis is a life-threatening condition that occurs when the body responds to an infection. Sepsis is treated in the hospital with IV antibiotics, fluids, and other medicines. Follow these instructions at home:  Medicines  Take over-the-counter and prescription medicines only as told by your health care provider.  If you were prescribed an antibiotic medicine, take it as told by your  health care provider. Do not stop using the antibiotic even if you start to feel better. General instructions  Make sure you: ? Empty your bladder often and completely. Do not hold urine for long periods of time. ? Empty your bladder after sex. ? Wipe from front  to back after a bowel movement if you are female. Use each tissue one time when you wipe.  Drink enough fluid to keep your urine pale yellow.  Keep all follow-up visits as told by your health care provider. This is important. Contact a health care provider if:  Your symptoms do not get better after 1-2 days.  Your symptoms go away and then return. Get help right away if you have:  Severe pain in your back or your lower abdomen.  A fever.  Nausea or vomiting. Summary  A urinary tract infection (UTI) is an infection of any part of the urinary tract, which includes the kidneys, ureters, bladder, and urethra.  Most urinary tract infections are caused by bacteria in your genital area, around the entrance to your urinary tract (urethra).  Treatment for this condition often includes antibiotic medicines.  If you were prescribed an antibiotic medicine, take it as told by your health care provider. Do not stop using the antibiotic even if you start to feel better.  Keep all follow-up visits as told by your health care provider. This is important. This information is not intended to replace advice given to you by your health care provider. Make sure you discuss any questions you have with your health care provider. Document Revised: 09/18/2018 Document Reviewed: 04/10/2018 Elsevier Patient Education  2020 [employer].

## 2020-06-08 NOTE — Progress Notes (Signed)
06/08/20 2:07 PM   [name] 07-10-1946 [id number]  CC: Recurrent UTI  HPI: I saw [name] in urology clinic today for recurrent UTI.  She is a healthy 74 year old female who had only had 2-3 UTIs in her entire adult life who recently developed recurrent urinary tract infections starting in August 2020.  She has had 4 documented UTIs during that time including Enterococcus and E. coli.  Most recent infection was Enterococcus on 8/12 and she is on Augmentin.  She denies any gross hematuria.  She has had microscopic hematuria with infections previously.  When she has a UTI her symptoms include urinary frequency, pelvic pressure, dysuria, and weak stream.  She is a never smoker.  Recent CT showed no evidence of nephrolithiasis or hydronephrosis.  She is not sexually active.   PMH: Past Medical History:  Diagnosis Date  . Aortic atherosclerosis ([place])   . Arthritis   . Dilated congestive cardiomyopathy ([place])   . Diverticulitis, colon   . GERD (gastroesophageal reflux disease)   . TIA (transient ischemic attack)     Surgical History: Past Surgical History:  Procedure Laterality Date  . CESAREAN SECTION     x 2   . colonoscopy with polypectomy    . COLONOSCOPY WITH PROPOFOL N/A 09/18/2017   Procedure: COLONOSCOPY WITH PROPOFOL;  Surgeon: Manya Silvas, MD;  Location: [facility] ENDOSCOPY;  Service: Endoscopy;  Laterality: N/A;    Family History: Family History  Problem Relation Age of Onset  . Cancer Brother 78       colon ca  . Early death Mother   . Tuberculosis Mother   . Early death Father   . Appendicitis Father   . Breast cancer Cousin        paternal    Social History:  reports that she has never smoked. She has never used smokeless tobacco. She reports current alcohol use of about 4.0 standard drinks of alcohol per week. She reports that she does not use drugs.  Physical Exam: BP (!) 145/86   Pulse 81   Ht 5\' 5"  (1.651 m)   Wt 155 lb (70.3 kg)   BMI  25.79 kg/m    Constitutional:  Alert and oriented, No acute distress. Cardiovascular: No clubbing, cyanosis, or edema. Respiratory: Normal respiratory effort, no increased work of breathing. GI: Abdomen is soft, nontender, nondistended, no abdominal masses  Laboratory Data: Culture data reviewed in epic, see HPI  Pertinent Imaging: I have personally reviewed the CT stone protocol dated 05/26/2020.  No hydronephrosis or nephrolithiasis.  Assessment & Plan:   74 year old healthy female with 4 culture proven UTIs in the last 6 months and normal CT scan.  We discussed the evaluation and treatment of patients with recurrent UTIs at length.  We specifically discussed the differences between asymptomatic bacteriuria and true urinary tract infection.  We discussed the AUA definition of recurrent UTI of at least 2 culture proven symptomatic acute cystitis episodes in a 64-month period, or 3 within a 1 year period.  We discussed the importance of culture directed antibiotic treatment, and antibiotic stewardship.  First-line therapy includes nitrofurantoin(5 days), Bactrim(3 days), or fosfomycin(3 g single dose).  Possible etiologies of recurrent infection include periurethral tissue atrophy in postmenopausal woman, constipation, sexual activity, incomplete emptying, anatomic abnormalities, and even genetic predisposition.  Finally, we discussed the role of perineal hygiene, timed voiding, adequate hydration, topical vaginal estrogen, cranberry prophylaxis, and low-dose antibiotic prophylaxis.  -Cranberry tablet prophylaxis twice daily -Premarin cream 3 times  per week, samples given -RTC 6 to 8 weeks with PA for symptom check -Consider cystoscopy in the future if recurrent infections despite above strategies, or 6 months of nitrofurantoin daily prophylaxis  Sally Madrid, MD 06/08/2020  [facility] [address], [place], [place] [address] [phone]

## 2020-06-09 ENCOUNTER — Other Ambulatory Visit (INDEPENDENT_AMBULATORY_CARE_PROVIDER_SITE_OTHER): Payer: PPO

## 2020-06-09 ENCOUNTER — Other Ambulatory Visit: Payer: Self-pay

## 2020-06-09 DIAGNOSIS — I10 Essential (primary) hypertension: Secondary | ICD-10-CM

## 2020-06-09 DIAGNOSIS — E782 Mixed hyperlipidemia: Secondary | ICD-10-CM

## 2020-06-09 DIAGNOSIS — E559 Vitamin D deficiency, unspecified: Secondary | ICD-10-CM

## 2020-06-09 DIAGNOSIS — R5383 Other fatigue: Secondary | ICD-10-CM

## 2020-06-09 LAB — CBC WITH DIFFERENTIAL/PLATELET
Basophils Absolute: 0 10*3/uL (ref 0.0–0.1)
Basophils Relative: 0.5 % (ref 0.0–3.0)
Eosinophils Absolute: 0.1 10*3/uL (ref 0.0–0.7)
Eosinophils Relative: 1.5 % (ref 0.0–5.0)
HCT: 37.2 % (ref 36.0–46.0)
Hemoglobin: 12.5 g/dL (ref 12.0–15.0)
Lymphocytes Relative: 52.7 % — ABNORMAL HIGH (ref 12.0–46.0)
Lymphs Abs: 1.9 10*3/uL (ref 0.7–4.0)
MCHC: 33.6 g/dL (ref 30.0–36.0)
MCV: 94.6 fl (ref 78.0–100.0)
Monocytes Absolute: 0.5 10*3/uL (ref 0.1–1.0)
Monocytes Relative: 12.3 % — ABNORMAL HIGH (ref 3.0–12.0)
Neutro Abs: 1.2 10*3/uL — ABNORMAL LOW (ref 1.4–7.7)
Neutrophils Relative %: 33 % — ABNORMAL LOW (ref 43.0–77.0)
Platelets: 217 10*3/uL (ref 150.0–400.0)
RBC: 3.93 Mil/uL (ref 3.87–5.11)
RDW: 13.1 % (ref 11.5–15.5)
WBC: 3.7 10*3/uL — ABNORMAL LOW (ref 4.0–10.5)

## 2020-06-09 LAB — URINALYSIS, COMPLETE
Bilirubin, UA: NEGATIVE
Glucose, UA: NEGATIVE
Ketones, UA: NEGATIVE
Leukocytes,UA: NEGATIVE
Nitrite, UA: NEGATIVE
Protein,UA: NEGATIVE
Specific Gravity, UA: 1.01 (ref 1.005–1.030)
Urobilinogen, Ur: 0.2 mg/dL (ref 0.2–1.0)
pH, UA: 5.5 (ref 5.0–7.5)

## 2020-06-09 LAB — COMPREHENSIVE METABOLIC PANEL
ALT: 27 U/L (ref 0–35)
AST: 26 U/L (ref 0–37)
Albumin: 4.3 g/dL (ref 3.5–5.2)
Alkaline Phosphatase: 55 U/L (ref 39–117)
BUN: 16 mg/dL (ref 6–23)
CO2: 27 mEq/L (ref 19–32)
Calcium: 9.4 mg/dL (ref 8.4–10.5)
Chloride: 102 mEq/L (ref 96–112)
Creatinine, Ser: 0.7 mg/dL (ref 0.40–1.20)
GFR: 81.89 mL/min (ref >60.00)
Glucose, Bld: 92 mg/dL (ref 70–99)
Potassium: 3.9 mEq/L (ref 3.5–5.1)
Sodium: 136 mEq/L (ref 135–145)
Total Bilirubin: 0.6 mg/dL (ref 0.2–1.2)
Total Protein: 7.5 g/dL (ref 6.0–8.3)

## 2020-06-09 LAB — LIPID PANEL
Cholesterol: 198 mg/dL (ref 0–200)
HDL: 57.9 mg/dL (ref >39.00)
LDL Cholesterol: 127 mg/dL — ABNORMAL HIGH (ref 0–99)
NonHDL: 140.43
Total CHOL/HDL Ratio: 3
Triglycerides: 69 mg/dL (ref 0.0–149.0)
VLDL: 13.8 mg/dL (ref 0.0–40.0)

## 2020-06-09 LAB — MICROSCOPIC EXAMINATION: Bacteria, UA: NONE SEEN

## 2020-06-09 LAB — TSH: TSH: 1.07 u[IU]/mL (ref 0.35–4.50)

## 2020-06-09 LAB — VITAMIN D 25 HYDROXY (VIT D DEFICIENCY, FRACTURES): VITD: 21.67 ng/mL — ABNORMAL LOW (ref 30.00–100.00)

## 2020-06-14 ENCOUNTER — Telehealth: Payer: Self-pay

## 2020-06-14 NOTE — Telephone Encounter (Signed)
Patient call back again for labs results

## 2020-06-14 NOTE — Telephone Encounter (Signed)
Patient returned office phone call for lab results. 

## 2020-06-14 NOTE — Telephone Encounter (Signed)
LMTCB in regards to lab results.  

## 2020-06-15 NOTE — Telephone Encounter (Signed)
See result note message 

## 2020-06-15 NOTE — Telephone Encounter (Signed)
Patient called in for results.

## 2020-06-15 NOTE — Telephone Encounter (Signed)
LMTCB

## 2020-06-16 ENCOUNTER — Other Ambulatory Visit: Payer: Self-pay | Admitting: Internal Medicine

## 2020-06-16 DIAGNOSIS — D72823 Leukemoid reaction: Secondary | ICD-10-CM

## 2020-06-21 ENCOUNTER — Ambulatory Visit: Payer: PPO

## 2020-06-21 ENCOUNTER — Ambulatory Visit (INDEPENDENT_AMBULATORY_CARE_PROVIDER_SITE_OTHER): Payer: PPO | Admitting: Internal Medicine

## 2020-06-21 ENCOUNTER — Other Ambulatory Visit: Payer: Self-pay

## 2020-06-21 ENCOUNTER — Encounter: Payer: Self-pay | Admitting: Internal Medicine

## 2020-06-21 ENCOUNTER — Ambulatory Visit (INDEPENDENT_AMBULATORY_CARE_PROVIDER_SITE_OTHER): Payer: PPO

## 2020-06-21 DIAGNOSIS — I700 Atherosclerosis of aorta: Secondary | ICD-10-CM

## 2020-06-21 DIAGNOSIS — R1012 Left upper quadrant pain: Secondary | ICD-10-CM

## 2020-06-21 LAB — D-DIMER, QUANTITATIVE: D-Dimer, Quant: 2.13 mcg/mL FEU — ABNORMAL HIGH (ref <0.50)

## 2020-06-21 NOTE — Patient Instructions (Addendum)
try using SalonPas patches w/ lidocaine.  These provide great relief of muscle pain/spasm  Will likley need to get a chest CT to be sure there is no mass or other abnormality

## 2020-06-21 NOTE — Assessment & Plan Note (Signed)
Acute on chronic.  Not pleuritic,  But located under left breast.  D Dimer,  Chest x ray.    CT chest may be needed to evaluate spleen  Left lower lobe, and ribs. Marland Kitchen
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
## 2020-06-22 ENCOUNTER — Telehealth: Payer: Self-pay

## 2020-06-22 ENCOUNTER — Ambulatory Visit
Admission: RE | Admit: 2020-06-22 | Discharge: 2020-06-22 | Disposition: A | Discharge status: Home / Self Care | Payer: PPO | Source: Ambulatory Visit | Attending: Internal Medicine | Admitting: Internal Medicine

## 2020-06-22 ENCOUNTER — Other Ambulatory Visit: Payer: Self-pay | Admitting: Internal Medicine

## 2020-06-22 DIAGNOSIS — R1012 Left upper quadrant pain: Secondary | ICD-10-CM

## 2020-06-22 DIAGNOSIS — R0782 Intercostal pain: Secondary | ICD-10-CM | POA: Insufficient documentation

## 2020-06-22 DIAGNOSIS — J90 Pleural effusion, not elsewhere classified: Secondary | ICD-10-CM | POA: Diagnosis not present

## 2020-06-22 DIAGNOSIS — J432 Centrilobular emphysema: Secondary | ICD-10-CM | POA: Diagnosis not present

## 2020-06-22 DIAGNOSIS — I2510 Atherosclerotic heart disease of native coronary artery without angina pectoris: Secondary | ICD-10-CM | POA: Diagnosis not present

## 2020-06-22 HISTORY — DX: Essential (primary) hypertension: I10

## 2020-06-22 LAB — URINALYSIS, ROUTINE W REFLEX MICROSCOPIC
Bilirubin Urine: NEGATIVE
Hgb urine dipstick: NEGATIVE
Ketones, ur: NEGATIVE
Nitrite: NEGATIVE
Specific Gravity, Urine: <=1.005 — AB (ref 1.000–1.030)
Total Protein, Urine: NEGATIVE
Urine Glucose: NEGATIVE
Urobilinogen, UA: 0.2 (ref 0.0–1.0)
pH: 5.5 (ref 5.0–8.0)

## 2020-06-22 LAB — CBC WITH DIFFERENTIAL/PLATELET
Basophils Absolute: 0 10*3/uL (ref 0.0–0.1)
Basophils Relative: 0.5 % (ref 0.0–3.0)
Eosinophils Absolute: 0.1 10*3/uL (ref 0.0–0.7)
Eosinophils Relative: 1.4 % (ref 0.0–5.0)
HCT: 36.4 % (ref 36.0–46.0)
Hemoglobin: 12.4 g/dL (ref 12.0–15.0)
Lymphocytes Relative: 43.4 % (ref 12.0–46.0)
Lymphs Abs: 2.1 10*3/uL (ref 0.7–4.0)
MCHC: 34.1 g/dL (ref 30.0–36.0)
MCV: 94.2 fl (ref 78.0–100.0)
Monocytes Absolute: 0.5 10*3/uL (ref 0.1–1.0)
Monocytes Relative: 10.9 % (ref 3.0–12.0)
Neutro Abs: 2.1 10*3/uL (ref 1.4–7.7)
Neutrophils Relative %: 43.8 % (ref 43.0–77.0)
Platelets: 208 10*3/uL (ref 150.0–400.0)
RBC: 3.86 Mil/uL — ABNORMAL LOW (ref 3.87–5.11)
RDW: 12.7 % (ref 11.5–15.5)
WBC: 4.7 10*3/uL (ref 4.0–10.5)

## 2020-06-22 LAB — COMPREHENSIVE METABOLIC PANEL
ALT: 20 U/L (ref 0–35)
AST: 22 U/L (ref 0–37)
Albumin: 4.5 g/dL (ref 3.5–5.2)
Alkaline Phosphatase: 67 U/L (ref 39–117)
BUN: 12 mg/dL (ref 6–23)
CO2: 29 mEq/L (ref 19–32)
Calcium: 9.5 mg/dL (ref 8.4–10.5)
Chloride: 104 mEq/L (ref 96–112)
Creatinine, Ser: 0.64 mg/dL (ref 0.40–1.20)
GFR: 90.8 mL/min (ref >60.00)
Glucose, Bld: 95 mg/dL (ref 70–99)
Potassium: 4.2 mEq/L (ref 3.5–5.1)
Sodium: 140 mEq/L (ref 135–145)
Total Bilirubin: 0.4 mg/dL (ref 0.2–1.2)
Total Protein: 7.2 g/dL (ref 6.0–8.3)

## 2020-06-22 MED ORDER — IOHEXOL 350 MG/ML SOLN
100.0000 mL | Freq: Once | INTRAVENOUS | Status: AC | PRN
  Administered 2020-06-22: 100 mL via INTRAVENOUS

## 2020-06-22 NOTE — Telephone Encounter (Signed)
Critical Lab Value:   D-Dimer: elevated at 2.13

## 2020-06-22 NOTE — Assessment & Plan Note (Signed)
Noted on CTA.  Discussed with patient .  Will have patient return to discuss statin therapy

## 2020-06-22 NOTE — Assessment & Plan Note (Signed)
STAT CTA done due to elevated D Dimer.  No PE,  No aortic dissection .  Patient advised,  encouarged to consider Salon pas with lidocaine patches and/or Physical therapy to manage msk pain .

## 2020-07-08 ENCOUNTER — Ambulatory Visit: Payer: PPO | Admitting: Internal Medicine

## 2020-07-21 ENCOUNTER — Other Ambulatory Visit (INDEPENDENT_AMBULATORY_CARE_PROVIDER_SITE_OTHER): Payer: PPO

## 2020-07-21 ENCOUNTER — Other Ambulatory Visit: Payer: Self-pay

## 2020-07-21 DIAGNOSIS — D72823 Leukemoid reaction: Secondary | ICD-10-CM

## 2020-07-21 LAB — CBC WITH DIFFERENTIAL/PLATELET
Basophils Absolute: 0 10*3/uL (ref 0.0–0.1)
Basophils Relative: 0.7 % (ref 0.0–3.0)
Eosinophils Absolute: 0.1 10*3/uL (ref 0.0–0.7)
Eosinophils Relative: 1.2 % (ref 0.0–5.0)
HCT: 36.5 % (ref 36.0–46.0)
Hemoglobin: 12.4 g/dL (ref 12.0–15.0)
Lymphocytes Relative: 42.3 % (ref 12.0–46.0)
Lymphs Abs: 2.1 10*3/uL (ref 0.7–4.0)
MCHC: 33.9 g/dL (ref 30.0–36.0)
MCV: 93.6 fl (ref 78.0–100.0)
Monocytes Absolute: 0.6 10*3/uL (ref 0.1–1.0)
Monocytes Relative: 11.8 % (ref 3.0–12.0)
Neutro Abs: 2.1 10*3/uL (ref 1.4–7.7)
Neutrophils Relative %: 44 % (ref 43.0–77.0)
Platelets: 201 10*3/uL (ref 150.0–400.0)
RBC: 3.9 Mil/uL (ref 3.87–5.11)
RDW: 12.8 % (ref 11.5–15.5)
WBC: 4.9 10*3/uL (ref 4.0–10.5)

## 2020-07-29 ENCOUNTER — Encounter: Payer: Self-pay | Admitting: Internal Medicine

## 2020-07-29 ENCOUNTER — Ambulatory Visit (INDEPENDENT_AMBULATORY_CARE_PROVIDER_SITE_OTHER): Payer: PPO | Admitting: Internal Medicine

## 2020-07-29 ENCOUNTER — Other Ambulatory Visit: Payer: Self-pay

## 2020-07-29 VITALS — BP 142/92 | HR 79 | Temp 97.6°F | Resp 14 | Ht 65.0 in | Wt 158.0 lb

## 2020-07-29 DIAGNOSIS — I700 Atherosclerosis of aorta: Secondary | ICD-10-CM

## 2020-07-29 DIAGNOSIS — R1012 Left upper quadrant pain: Secondary | ICD-10-CM

## 2020-07-29 DIAGNOSIS — Z79899 Other long term (current) drug therapy: Secondary | ICD-10-CM

## 2020-07-29 DIAGNOSIS — J432 Centrilobular emphysema: Secondary | ICD-10-CM

## 2020-07-29 DIAGNOSIS — E782 Mixed hyperlipidemia: Secondary | ICD-10-CM

## 2020-07-29 DIAGNOSIS — Z23 Encounter for immunization: Secondary | ICD-10-CM

## 2020-07-29 MED ORDER — ROSUVASTATIN CALCIUM 20 MG PO TABS: 20.0000 mg | ORAL_TABLET | ORAL | 0 refills | Status: DC

## 2020-07-29 MED ORDER — TIZANIDINE HCL 4 MG PO TABS: 4.0000 mg | ORAL_TABLET | Freq: Four times a day (QID) | ORAL | 0 refills | Status: DC | PRN

## 2020-07-29 NOTE — Progress Notes (Signed)
Subjective:  Patient ID: MONICK RENA, female    DOB: Oct 15, 1946  Age: 74 y.o. MRN: [id number]  CC: The primary encounter diagnosis was Need for immunization against influenza. Diagnoses of COVID-19 vaccine regimen to maintain immunity completed, Long-term use of high-risk medication, Centrilobular emphysema ([place]), Aortic atherosclerosis ([place]), Mixed hyperlipidemia, and Acute LUQ pain were also pertinent to this visit.  HPI VITTORIA NOREEN presents for follow up on CT chest with angio done in early September for evaluation of stabbing chest pain   Aortic atherosclerosis  noted on CT .   Prognostic implication discussed.  She is very apprehensive about statins bc she has chronic muscle pain ,  And thinks that her husbands MSA was due to too low cholesterol.  She follows a Mediterranean diet and walks regularly for exercise.  Coronary calcifications:  She remains asymptomatic  . Mot interested in coronary calcium CT currently  Emphysema   , centrilobular noted on CT.  She has no personal history of smoking but spend many years around friends and family who smoked.  She denies all symptoms of COPD     Taking ARED     Outpatient Medications Prior to Visit  Medication Sig Dispense Refill  . Cholecalciferol (VITAMIN D3) 50 MCG (2000 UT) CAPS Take 1 capsule by mouth daily.    . Cranberry 1000 MG CAPS Take 2 capsules by mouth daily.    Marland Kitchen GARLIC OIL PO Take by mouth.    . losartan (COZAAR) 25 MG tablet Take 1 tablet (25 mg total) by mouth daily. 90 tablet 3  . Multiple Vitamins-Minerals (WOMENS MULTIVITAMIN PO) Take 1 tablet by mouth.    . Omega-3 Fatty Acids (FISH OIL) 1000 MG CAPS Take 1 capsule by mouth daily.    . cholecalciferol (VITAMIN D3) 25 MCG (1000 UNIT) tablet Take 1,000 Units by mouth daily.     Facility-Administered Medications Prior to Visit  Medication Dose Route Frequency Provider Last Rate Last Admin  . betamethasone acetate-betamethasone sodium phosphate (CELESTONE)  injection 3 mg  3 mg Intramuscular Once Daylene Katayama M, DPM      . betamethasone acetate-betamethasone sodium phosphate (CELESTONE) injection 3 mg  3 mg Intramuscular Once Edrick Kins, DPM        Review of Systems;  Patient denies headache, fevers, malaise, unintentional weight loss, skin rash, eye pain, sinus congestion and sinus pain, sore throat, dysphagia,  hemoptysis , cough, dyspnea, wheezing, chest pain, palpitations, orthopnea, edema, abdominal pain, nausea, melena, diarrhea, constipation, flank pain, dysuria, hematuria, urinary  Frequency, nocturia, numbness, tingling, seizures,  Focal weakness, Loss of consciousness,  Tremor, insomnia, depression, anxiety, and suicidal ideation.      Objective:  BP (!) 142/92 (BP Location: Left Arm, Patient Position: Sitting, Cuff Size: Normal)   Pulse 79   Temp 97.6 F (36.4 C) (Oral)   Resp 14   Ht 5\' 5"  (1.651 m)   Wt 158 lb (71.7 kg)   SpO2 98%   BMI 26.29 kg/m   BP Readings from Last 3 Encounters:  07/29/20 (!) 142/92  06/21/20 (!) 134/92  06/08/20 (!) 145/86    Wt Readings from Last 3 Encounters:  07/29/20 158 lb (71.7 kg)  06/21/20 161 lb (73 kg)  06/08/20 155 lb (70.3 kg)    General appearance: alert, cooperative and appears stated age Ears: normal TM's and external ear canals both ears Throat: lips, mucosa, and tongue normal; teeth and gums normal Neck: no adenopathy, no carotid bruit, supple, symmetrical, trachea midline  and thyroid not enlarged, symmetric, no tenderness/mass/nodules Back: symmetric, no curvature. ROM normal. No CVA tenderness. Lungs: clear to auscultation bilaterally Heart: regular rate and rhythm, S1, S2 normal, no murmur, click, rub or gallop Abdomen: soft, non-tender; bowel sounds normal; no masses,  no organomegaly Pulses: 2+ and symmetric Skin: Skin color, texture, turgor normal. No rashes or lesions Lymph nodes: Cervical, supraclavicular, and axillary nodes normal.  No results found for:  HGBA1C  Lab Results  Component Value Date   CREATININE 0.64 06/21/2020   CREATININE 0.70 06/09/2020   CREATININE 0.73 06/15/2019    Lab Results  Component Value Date   WBC 4.9 07/21/2020   HGB 12.4 07/21/2020   HCT 36.5 07/21/2020   PLT 201.0 07/21/2020   GLUCOSE 95 06/21/2020   CHOL 198 06/09/2020   TRIG 69.0 06/09/2020   HDL 57.90 06/09/2020   LDLDIRECT 142.0 05/10/2017   LDLCALC 127 (H) 06/09/2020   ALT 20 06/21/2020   AST 22 06/21/2020   NA 140 06/21/2020   K 4.2 06/21/2020   CL 104 06/21/2020   CREATININE 0.64 06/21/2020   BUN 12 06/21/2020   CO2 29 06/21/2020   TSH 1.07 06/09/2020   MICROALBUR 0.4 11/05/2013    CT Angio Chest W/Cm &/Or Wo Cm  Result Date: 06/22/2020 CLINICAL DATA:  Chest pain and positive D-dimer study EXAM: CT ANGIOGRAPHY CHEST WITH CONTRAST TECHNIQUE: Multidetector CT imaging of the chest was performed using the standard protocol during bolus administration of intravenous contrast. Multiplanar CT image reconstructions and MIPs were obtained to evaluate the vascular anatomy. CONTRAST:  111mL OMNIPAQUE IOHEXOL 350 MG/ML SOLN COMPARISON:  Chest CT November 02, 2013; chest radiograph April 20, 2020 FINDINGS: Cardiovascular: No demonstrable pulmonary embolus. Stable appearing aortic contour without demonstrable thoracic aortic aneurysm. No dissection evident. Note that the contrast bolus within the aorta is somewhat less than optimal for potential dissection assessment. Visualized great vessels appear unremarkable. There is aortic atherosclerosis. There are foci of coronary artery calcification. There is no pericardial effusion or pericardial thickening. Mediastinum/Nodes: Visualized thyroid appears unremarkable. There is no demonstrable thoracic adenopathy. There is a rather minimal hiatal hernia. Lungs/Pleura: There is a degree of underlying centrilobular emphysematous change. There is a rather minimal right pleural effusion. There is patchy atelectatic change in  the lower lung regions. There is mild scarring in the left upper lobe toward the apex, stable. There is a calcified granuloma in the superior segment of the right lower lobe, stable. Upper Abdomen: There are foci of calcification in the splenic artery. Visualized upper abdominal structures otherwise appear normal. Musculoskeletal: There are foci of degenerative change in the thoracic spine. No evident blastic or lytic bone lesions. No appreciable chest wall lesions. Review of the MIP images confirms the above findings. IMPRESSION: 1. No evident pulmonary embolus. No thoracic aortic aneurysm. No dissection seen; note that the contrast bolus in the aorta is less than optimal for potential dissection assessment. There is aortic atherosclerosis as well as foci of coronary artery calcification. 2. There is a degree of underlying centrilobular emphysematous change. Areas of patchy atelectasis and scarring without edema or consolidation. Minimal right pleural effusion. Calcified granuloma right lower lobe, stable. 3.  No evident adenopathy. 4.  Rather minimal hiatal hernia. Aortic Atherosclerosis (ICD10-I70.0) and Emphysema (ICD10-J43.9). These results will be called to the ordering clinician or representative by the Radiology Department at the imaging location. Electronically Signed   By: Lowella Grip III M.D.   On: 06/22/2020 12:31    Assessment & Plan:  Problem List Items Addressed This Visit      Unprioritized   Acute LUQ pain    No source was found on CT angiogram.  Pain has not recurred. Most likely cause is muscle spasm.  Tizanidine for next occurrence       Aortic atherosclerosis ([place])    After a lengthy discussion she is willing to initiate therapy with once or twice weekly Crestor.  She will return in 3 weeks   Lab Results  Component Value Date   CHOL 198 06/09/2020   HDL 57.90 06/09/2020   LDLCALC 127 (H) 06/09/2020   LDLDIRECT 142.0 05/10/2017   TRIG 69.0 06/09/2020   CHOLHDL 3  06/09/2020         Relevant Medications   rosuvastatin (CRESTOR) 20 MG tablet (Start on 08/01/2020)   Centrilobular emphysema ([place])    Reported changes on CTA  ; however patient is asymptomatic.  Ruling out alpha 1 antitrypsin deficiency       Relevant Orders   Alpha-1-antitrypsin   Hyperlipidemia    Starting rosuvastatin.    Lab Results  Component Value Date   CHOL 198 06/09/2020   HDL 57.90 06/09/2020   LDLCALC 127 (H) 06/09/2020   LDLDIRECT 142.0 05/10/2017   TRIG 69.0 06/09/2020   CHOLHDL 3 06/09/2020         Relevant Medications   rosuvastatin (CRESTOR) 20 MG tablet (Start on 08/01/2020)    Other Visit Diagnoses    Need for immunization against influenza    -  Primary   Relevant Orders   Flu Vaccine QUAD High Dose(Fluad) (Completed)   COVID-19 vaccine regimen to maintain immunity completed       Relevant Orders   SARS-CoV-2 Semi-Quantitative Total Antibody, Spike   Long-term use of high-risk medication       Relevant Orders   Comprehensive metabolic panel      I have discontinued Jennette Banker. [place]'s cholecalciferol. I am also having her start on tiZANidine and rosuvastatin. Additionally, I am having her maintain her Multiple Vitamins-Minerals (WOMENS MULTIVITAMIN PO), GARLIC OIL PO, losartan, Fish Oil, Cranberry, and vitamin D3. We will continue to administer betamethasone acetate-betamethasone sodium phosphate and betamethasone acetate-betamethasone sodium phosphate.  Meds ordered this encounter  Medications  . tiZANidine (ZANAFLEX) 4 MG tablet    Sig: Take 1 tablet (4 mg total) by mouth every 6 (six) hours as needed for muscle spasms.    Dispense:  30 tablet    Refill:  0  . rosuvastatin (CRESTOR) 20 MG tablet    Sig: Take 1 tablet (20 mg total) by mouth 2 (two) times a week.    Dispense:  30 tablet    Refill:  0    Medications Discontinued During This Encounter  Medication Reason  . cholecalciferol (VITAMIN D3) 25 MCG (1000 UNIT) tablet      Follow-up: No follow-ups on file.   Crecencio Mc, MD

## 2020-07-29 NOTE — Patient Instructions (Addendum)
continue taking 2000 Ius of Vitamin D3 daily   Please give rosuvastatin a trial . Take it two times per week  .  This is to stabilize the placque in your aorta. Return in 3 weeks for non fasting lab tests  Your lungs have changes consistent with emphysema.  The cause of this is unclear since you have never smoked  I will run an additional test when you return in two weeks to see if it is due to an enzyme dificiency called alpha 1 antitrypsin

## 2020-07-31 DIAGNOSIS — J432 Centrilobular emphysema: Secondary | ICD-10-CM | POA: Insufficient documentation

## 2020-07-31 NOTE — Assessment & Plan Note (Signed)
No source was found on CT angiogram.  Pain has not recurred. Most likely cause is muscle spasm.  Tizanidine for next occurrence

## 2020-07-31 NOTE — Assessment & Plan Note (Signed)
After a lengthy discussion she is willing to initiate therapy with once or twice weekly Crestor.  She will return in 3 weeks   Lab Results  Component Value Date   CHOL 198 06/09/2020   HDL 57.90 06/09/2020   LDLCALC 127 (H) 06/09/2020   LDLDIRECT 142.0 05/10/2017   TRIG 69.0 06/09/2020   CHOLHDL 3 06/09/2020

## 2020-07-31 NOTE — Assessment & Plan Note (Signed)
Reported changes on CTA  ; however patient is asymptomatic.  Ruling out alpha 1 antitrypsin deficiency

## 2020-07-31 NOTE — Assessment & Plan Note (Signed)
Starting rosuvastatin.    Lab Results  Component Value Date   CHOL 198 06/09/2020   HDL 57.90 06/09/2020   LDLCALC 127 (H) 06/09/2020   LDLDIRECT 142.0 05/10/2017   TRIG 69.0 06/09/2020   CHOLHDL 3 06/09/2020

## 2020-08-03 ENCOUNTER — Ambulatory Visit: Payer: Self-pay | Admitting: Urology

## 2020-08-03 LAB — SARS-COV-2 SEMI-QUANTITATIVE TOTAL ANTIBODY, SPIKE: SARS COV2 AB, Total Spike Semi QN: 612.5 U/mL — ABNORMAL HIGH (ref <0.8)

## 2020-08-04 ENCOUNTER — Telehealth: Payer: Self-pay

## 2020-08-04 NOTE — Telephone Encounter (Signed)
LMTCB in regards to lab results.  

## 2020-08-04 NOTE — Progress Notes (Signed)
You have a very robust COVID antibody titre.  Regards,   Kema Santaella, MD

## 2020-08-08 NOTE — Progress Notes (Signed)
08/09/20 9:43 AM   Sally Morgan Feb 15, 1946 [id number]  CC: Recurrent UTI  HPI: [name] is here today for follow up for rUTI's.  Background history She was seen by Dr. Diamantina Providence on 06/08/2020.  She is a healthy 74 year old female who had only had 2-3 UTIs in her entire adult life who recently developed recurrent urinary tract infections starting in August 2020.  She has had 4 documented UTIs during that time including Enterococcus and E. coli.  Most recent infection was Enterococcus on 8/12 and she is on Augmentin.  She denies any gross hematuria.  She has had microscopic hematuria with infections previously.  When she has a UTI her symptoms include urinary frequency, pelvic pressure, dysuria, and weak stream.  She is a never smoker.  Recent CT showed no evidence of nephrolithiasis or hydronephrosis.  She is not sexually active.  She was advised to start cranberry tablets, probiotics and vaginal estrogen cream and return for a symptoms recheck.  She is taking two cranberry tablets and has started the vaginal estrogen cream.    Today, she states she has been asymptomatic since she last saw Dr. Diamantina Providence.  She is concerned that since it has only been 1 month, the urinary tract infections will continue.  She is trying everything she is aware of to try to keep clean in the perineal area.  She feels that her hemorrhoids are contributing to the infections as well because they are difficult to keep clean.  Patient denies any modifying or aggravating factors.  Patient denies any gross hematuria, dysuria or suprapubic/flank pain.  Patient denies any fevers, chills, nausea or vomiting.   She can also feel a bulge between her legs during wiping and showering.  She denies any stress urinary incontinence.   PMH: Past Medical History:  Diagnosis Date  . Aortic atherosclerosis ([place])   . Arthritis   . Dilated congestive cardiomyopathy ([place])   . Diverticulitis, colon   . GERD (gastroesophageal  reflux disease)   . Hypertension   . TIA (transient ischemic attack)     Surgical History: Past Surgical History:  Procedure Laterality Date  . CESAREAN SECTION     x 2   . colonoscopy with polypectomy    . COLONOSCOPY WITH PROPOFOL N/A 09/18/2017   Procedure: COLONOSCOPY WITH PROPOFOL;  Surgeon: Manya Silvas, MD;  Location: [facility] ENDOSCOPY;  Service: Endoscopy;  Laterality: N/A;    Family History: Family History  Problem Relation Age of Onset  . Cancer Brother 64       colon ca  . Early death Mother   . Tuberculosis Mother   . Early death Father   . Appendicitis Father   . Breast cancer Cousin        paternal    Social History:  reports that she has never smoked. She has never used smokeless tobacco. She reports current alcohol use of about 4.0 standard drinks of alcohol per week. She reports that she does not use drugs.  Physical Exam: BP (!) 142/84   Pulse 85   Ht 5\' 5"  (1.651 m)   Wt 155 lb (70.3 kg)   BMI 25.79 kg/m   Constitutional:  Well nourished. Alert and oriented, No acute distress. HEENT: [place] AT, mask in place.  Trachea midline Cardiovascular: No clubbing, cyanosis, or edema. Respiratory: Normal respiratory effort, no increased work of breathing. Neurologic: Grossly intact, no focal deficits, moving all 4 extremities. Psychiatric: Normal mood and affect.   Laboratory Data: Culture data is  reviewed.  Please see epic. I have reviewed the labs.  Pertinent Imaging: CT stone protocol dated 05/26/2020.  No hydronephrosis or nephrolithiasis.  Assessment & Plan:   74 year old healthy female with 4 culture proven UTIs in the last 6 months and normal CT scan.  We reviewed the possible etiologies for recurrent urinary tract infections such as vaginal atrophy, genetic propensity, limited fluid intake, poor perineal hygiene, sexual activity etc. she states that she has been drinking a copious amount of fluids.  She is also very conscientious with perineal care  and she is not sexually active.  -Continue Cranberry tablet prophylaxis twice daily -Continue probiotics -Continue Premarin cream 3 times per week, refill given -RTC 4 months with for symptom check and exam  -Advised the patient to contact us in the interim if she should experience symptoms of urinary tract infection so that we may perform a urinalysis and urine culture -Consider cystoscopy in the future if recurrent infections despite above strategies, or 6 months of nitrofurantoin daily prophylaxis   Zara Council, PA-C  08/09/2020  [facility] [address] [place], [place] [address] [phone]

## 2020-08-09 ENCOUNTER — Other Ambulatory Visit: Payer: Self-pay

## 2020-08-09 ENCOUNTER — Ambulatory Visit: Payer: PPO | Admitting: Urology

## 2020-08-09 ENCOUNTER — Encounter: Payer: Self-pay | Admitting: Urology

## 2020-08-09 VITALS — BP 142/84 | HR 85 | Ht 65.0 in | Wt 155.0 lb

## 2020-08-09 DIAGNOSIS — N390 Urinary tract infection, site not specified: Secondary | ICD-10-CM

## 2020-08-09 DIAGNOSIS — N952 Postmenopausal atrophic vaginitis: Secondary | ICD-10-CM

## 2020-08-09 MED ORDER — PREMARIN 0.625 MG/GM VA CREA: TOPICAL_CREAM | VAGINAL | 12 refills | Status: DC

## 2020-08-09 NOTE — Patient Instructions (Signed)
Hibiclens 

## 2020-10-20 ENCOUNTER — Telehealth: Payer: Self-pay | Admitting: Cardiovascular Disease

## 2020-10-20 DIAGNOSIS — I340 Nonrheumatic mitral (valve) insufficiency: Secondary | ICD-10-CM

## 2020-10-20 DIAGNOSIS — I77810 Thoracic aortic ectasia: Secondary | ICD-10-CM

## 2020-10-20 NOTE — Telephone Encounter (Signed)
Patient wanting to schedule echo if needed prior to visit in march.    Please advise .

## 2020-10-20 NOTE — Telephone Encounter (Signed)
Last echo 11/19/19:   Echo No change, aorta is the same size Good cardiac function EF 60-65%.   Next appointment due in March 2022. Routing to Dr Mariah Milling to advise if echo needs doing prior to appointment.

## 2020-10-23 NOTE — Telephone Encounter (Signed)
Okay to order echo if she would like On annual basis we have been monitoring dilated ascending aorta Also worsening mitral valve regurgitation, now moderate in 2020 Will defer to the patient whether she would like to have it done

## 2020-10-24 NOTE — Telephone Encounter (Signed)
Left detail message, okay by DPR, advised ok by Dr. Rockey Situ to schedule an echo before her appt on 12/23/2020 d/t dilated ascending aorta and worsening mitral valve regurgitation. Advised to call clinic to schedule her echo.

## 2020-11-29 DIAGNOSIS — H353132 Nonexudative age-related macular degeneration, bilateral, intermediate dry stage: Secondary | ICD-10-CM | POA: Diagnosis not present

## 2020-12-08 ENCOUNTER — Ambulatory Visit: Payer: Self-pay | Admitting: Urology

## 2020-12-22 NOTE — Progress Notes (Signed)
Cardiology Office Note  Date:  12/23/2020   ID:  DEZYRAE KENSINGER, DOB 1946-04-30, MRN [id number]  PCP:  Crecencio Mc, MD   Chief Complaint  Patient presents with  . 12 month follow up     "doing well." Patient would like to discuss the cholesterol pill (Crestor) based on her CT Angio results. Medications reviewed by the patient verbally.     HPI:  Ms. Aaron  is a pleasant 75 year-old woman who has a history of  hyperlipidemia,  vein ablation in on the left  post procedure DVT noted on ultrasound.  started on aspirin,  changed to xarelto twice a day after the thrombus extended, for 2 weeks Dilated aortic root 3.7 to 3.9 centimeters Baseline EKG with diffuse T-wave abnormality Mild plaque in the aorta CT scan 2015, 2022  She presents for routine followup of her hyperlipidemia and dilated aortic root, aortic atherosclerosis  Did not take crestor 20 daily Cholesterol running 190s to 200 Does not want statin  CT images pulled up, 06/2020 Mild aorta plaque, LAD calcium, mild  No prior stress test Echo 11/2019  Good exercise tolerance,  no chest pain or shortness of breath on exertion   previous echocardiograms and CT scans  CT scan 2015 CT scan chest 2015 showing ascending aortic dilatation 3.7 cm Mild  aortic arch plaque, minimal descending aorta plaque, no coronary calcification  EKG personally reviewed by myself on todays visit Shows normal sinus rhythm with rate 74 bpm, T wave abnormality precordial leads, inferior leads, unchanged   Echo, numbers reviewed with her today Echo 11/2019: aorta 3.9 cm, reviewed in detail today  stable aortic root 4.1 cm in 2016 echocardiogram 3.5 cm in 2017 Echocardiogram in 2015 showed aortic root 4.2 cm with ejection fraction 45-50%, mildly dilated left ventricle (possible stress cardiomyopathy)   PMH:   has a past medical history of Aortic atherosclerosis ([place]), Arthritis, Dilated congestive cardiomyopathy ([place]), Diverticulitis,  colon, GERD (gastroesophageal reflux disease), Hypertension, and TIA (transient ischemic attack).  PSH:    Past Surgical History:  Procedure Laterality Date  . CESAREAN SECTION     x 2   . colonoscopy with polypectomy    . COLONOSCOPY WITH PROPOFOL N/A 09/18/2017   Procedure: COLONOSCOPY WITH PROPOFOL;  Surgeon: Manya Silvas, MD;  Location: [facility] ENDOSCOPY;  Service: Endoscopy;  Laterality: N/A;    Current Outpatient Medications  Medication Sig Dispense Refill  . Cholecalciferol (VITAMIN D3) 50 MCG (2000 UT) CAPS Take 1 capsule by mouth daily.    Marland Kitchen conjugated estrogens (PREMARIN) vaginal cream Apply 0.5mg  (pea-sized amount)  just inside the vaginal introitus with a finger-tip on  Monday, Wednesday and Friday nights. 30 g 12  . Cranberry 1000 MG CAPS Take 2 capsules by mouth daily.    Marland Kitchen GARLIC OIL PO Take by mouth.    . losartan (COZAAR) 25 MG tablet Take 1 tablet (25 mg total) by mouth daily. 90 tablet 3  . Multiple Vitamins-Minerals (WOMENS MULTIVITAMIN PO) Take 1 tablet by mouth.    . Omega-3 Fatty Acids (FISH OIL) 1000 MG CAPS Take 1 capsule by mouth daily.    Marland Kitchen tiZANidine (ZANAFLEX) 4 MG tablet Take 1 tablet (4 mg total) by mouth every 6 (six) hours as needed for muscle spasms. 30 tablet 0   Current Facility-Administered Medications  Medication Dose Route Frequency Provider Last Rate Last Admin  . betamethasone acetate-betamethasone sodium phosphate (CELESTONE) injection 3 mg  3 mg Intramuscular Once Edrick Kins, DPM      .  betamethasone acetate-betamethasone sodium phosphate (CELESTONE) injection 3 mg  3 mg Intramuscular Once Edrick Kins, DPM         Allergies:   Lisinopril, Meloxicam, and Nickel   Social History:  The patient  reports that she has never smoked. She has never used smokeless tobacco. She reports current alcohol use of about 4.0 standard drinks of alcohol per week. She reports that she does not use drugs.   Family History:   family history includes  Appendicitis in her father; Breast cancer in her cousin; Cancer (age of onset: 75) in her brother; Early death in her father and mother; Tuberculosis in her mother.    Review of Systems: Review of Systems  Constitutional: Negative.   HENT: Negative.   Respiratory: Negative.   Cardiovascular: Negative.   Gastrointestinal: Negative.   Musculoskeletal: Negative.   Neurological: Negative.   Psychiatric/Behavioral: Negative.   All other systems reviewed and are negative.   PHYSICAL EXAM: VS:  BP 132/80 (BP Location: Left Arm, Patient Position: Sitting, Cuff Size: Normal)   Pulse 74   Ht 5\' 5"  (1.651 m)   Wt 161 lb (73 kg)   SpO2 99%   BMI 26.79 kg/m  , BMI Body mass index is 26.79 kg/m. Constitutional:  oriented to person, place, and time. No distress.  HENT:  Head: Grossly normal Eyes:  no discharge. No scleral icterus.  Neck: No JVD, no carotid bruits  Cardiovascular: Regular rate and rhythm, no murmurs appreciated Pulmonary/Chest: Clear to auscultation bilaterally, no wheezes or rails Abdominal: Soft.  no distension.  no tenderness.  Musculoskeletal: Normal range of motion Neurological:  normal muscle tone. Coordination normal. No atrophy Skin: Skin warm and dry Psychiatric: normal affect, pleasant  Recent Labs: 06/09/2020: TSH 1.07 06/21/2020: ALT 20; BUN 12; Creatinine, Ser 0.64; Potassium 4.2; Sodium 140 07/21/2020: Hemoglobin 12.4; Platelets 201.0    Lipid Panel Lab Results  Component Value Date   CHOL 198 06/09/2020   HDL 57.90 06/09/2020   LDLCALC 127 (H) 06/09/2020   TRIG 69.0 06/09/2020      Wt Readings from Last 3 Encounters:  12/23/20 161 lb (73 kg)  08/09/20 155 lb (70.3 kg)  07/29/20 158 lb (71.7 kg)      ASSESSMENT AND PLAN:  Deep venous thrombosis of femoral vein with thrombophlebitis, unspecified laterality (HCC) stable  Aortic atherosclerosis (HCC) Mild plaque noted on CT scan in the aortic arch Images reviewed with her again today Does  not want to be on a statin Willing to try a zetia  Mixed hyperlipidemia diet, exercise Try Zetia Prefers not to be on a statin, did not even try the Crestor  Dilated aortic root ([place]) Discussed with her in detail, images reviewed CT scan, echocardiograms Stable aortic root size and ascending aorta 3.9 cm, relatively stable  Congestive dilated cardiomyopathy (HCC)  Ejection fraction 45 to 50% in 2015 Normalized in 2016 Euvolemic, no changes    Total encounter time more than 25 minutes  Greater than 50% was spent in counseling and coordination of care with the patient    No orders of the defined types were placed in this encounter.    Signed, Esmond Plants, M.D., Ph.D. 12/23/2020  [place], [place]

## 2020-12-23 ENCOUNTER — Encounter: Payer: Self-pay | Admitting: Cardiovascular Disease

## 2020-12-23 ENCOUNTER — Other Ambulatory Visit: Payer: Self-pay | Admitting: Cardiovascular Disease

## 2020-12-23 ENCOUNTER — Ambulatory Visit: Payer: PPO | Admitting: Cardiovascular Disease

## 2020-12-23 ENCOUNTER — Other Ambulatory Visit: Payer: Self-pay

## 2020-12-23 VITALS — BP 132/80 | HR 74 | Ht 65.0 in | Wt 161.0 lb

## 2020-12-23 DIAGNOSIS — I340 Nonrheumatic mitral (valve) insufficiency: Secondary | ICD-10-CM | POA: Diagnosis not present

## 2020-12-23 DIAGNOSIS — I77810 Thoracic aortic ectasia: Secondary | ICD-10-CM

## 2020-12-23 DIAGNOSIS — E782 Mixed hyperlipidemia: Secondary | ICD-10-CM

## 2020-12-23 DIAGNOSIS — I700 Atherosclerosis of aorta: Secondary | ICD-10-CM | POA: Diagnosis not present

## 2020-12-23 MED ORDER — EZETIMIBE 10 MG PO TABS: 10.0000 mg | ORAL_TABLET | Freq: Every day | ORAL | 3 refills | Status: DC

## 2020-12-23 NOTE — Patient Instructions (Signed)
Medication Instructions:  Please try zetia 10 mg daily  If you need a refill on your cardiac medications before your next appointment, please call your pharmacy.    Lab work: No new labs needed   If you have labs (blood work) drawn today and your tests are completely normal, you will receive your results only by: Marland Kitchen MyChart Message (if you have MyChart) OR . A paper copy in the mail If you have any lab test that is abnormal or we need to change your treatment, we will call you to review the results.   Testing/Procedures: No new testing needed   Follow-Up: At [facility], you and your health needs are our priority.  As part of our continuing mission to provide you with exceptional heart care, we have created designated Provider Care Teams.  These Care Teams include your primary Cardiologist (physician) and Advanced Practice Providers (APPs -  Physician Assistants and Nurse Practitioners) who all work together to provide you with the care you need, when you need it.  . You will need a follow up appointment in 12 months  . Providers on your designated Care Team:   . Murray Hodgkins, NP . Christell Faith, PA-C . Marrianne Mood, PA-C  Any Other Special Instructions Will Be Listed Below (If Applicable).  COVID-19 Vaccine Information can be found at: ShippingScam.co.uk For questions related to vaccine distribution or appointments, please email vaccine@[email or web address] .com or call 7271856742.

## 2021-06-07 ENCOUNTER — Telehealth: Payer: Self-pay

## 2021-06-07 ENCOUNTER — Ambulatory Visit: Payer: PPO

## 2021-06-07 NOTE — Telephone Encounter (Signed)
Called patient for scheduled AWV on preferred number. No answer. Left message to reschedule.

## 2021-07-18 ENCOUNTER — Telehealth: Payer: Self-pay

## 2021-07-18 NOTE — Telephone Encounter (Signed)
Placed call to pt to scheduled F/U with provider. LMTCB

## 2021-07-28 ENCOUNTER — Ambulatory Visit: Payer: PPO

## 2021-08-14 ENCOUNTER — Telehealth: Payer: Self-pay | Admitting: Internal Medicine

## 2021-08-14 DIAGNOSIS — I10 Essential (primary) hypertension: Secondary | ICD-10-CM

## 2021-08-14 DIAGNOSIS — E782 Mixed hyperlipidemia: Secondary | ICD-10-CM

## 2021-08-14 DIAGNOSIS — I700 Atherosclerosis of aorta: Secondary | ICD-10-CM

## 2021-08-14 NOTE — Telephone Encounter (Signed)
Patient has lab appt 08/17/2021, there are no orders in.

## 2021-08-14 NOTE — Addendum Note (Signed)
Addended by: Crecencio Mc on: 08/14/2021 04:06 PM   Modules accepted: Orders

## 2021-08-15 IMAGING — MG DIGITAL SCREENING BILAT W/ TOMO W/ CAD
6 of 12 series · 6 of 36 positions shown · non-contrast
Comparison: Previous exam(s).

CLINICAL DATA: Screening.

EXAM:
DIGITAL SCREENING BILATERAL MAMMOGRAM WITH TOMO AND CAD

[L CC synth-2D]
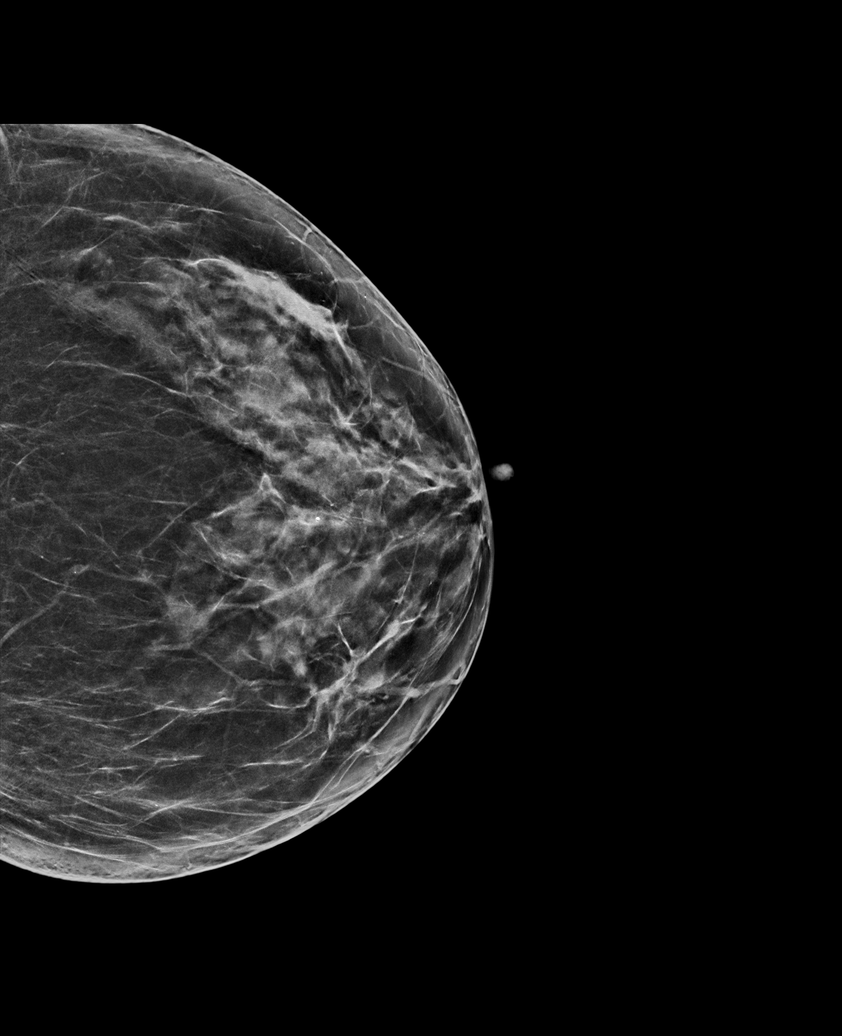

[L MLO synth-2D]
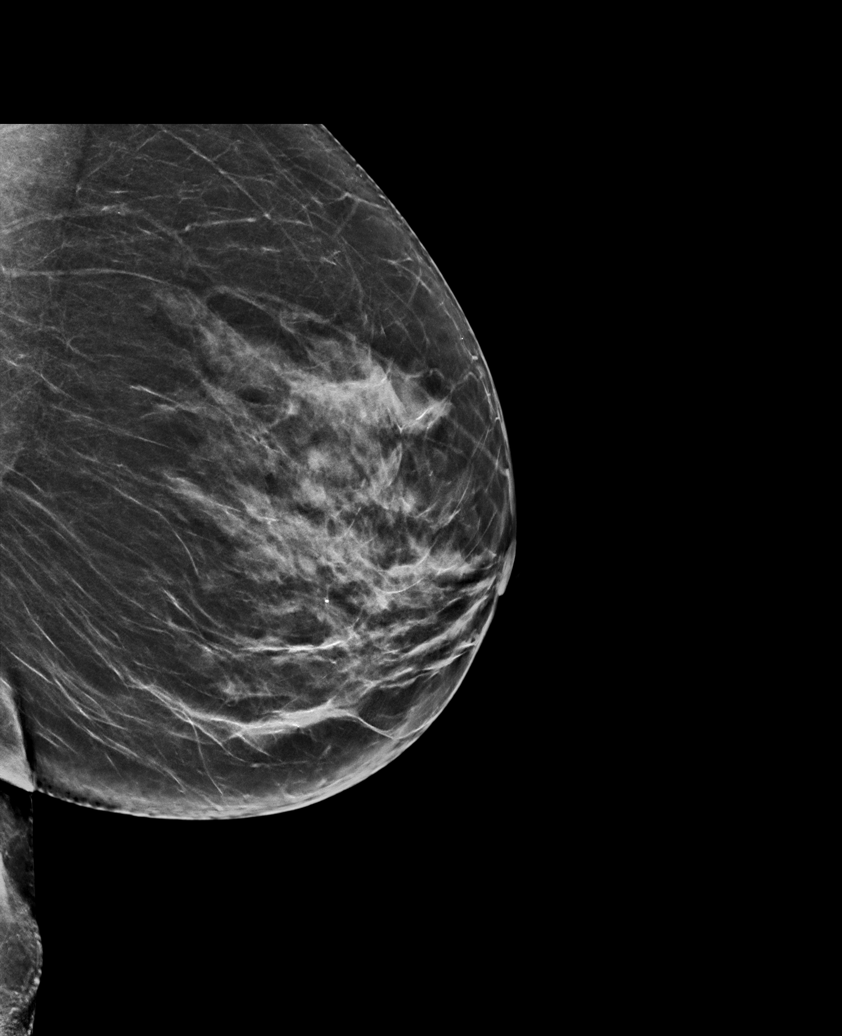

[R MLO synth-2D (1 of 2)]
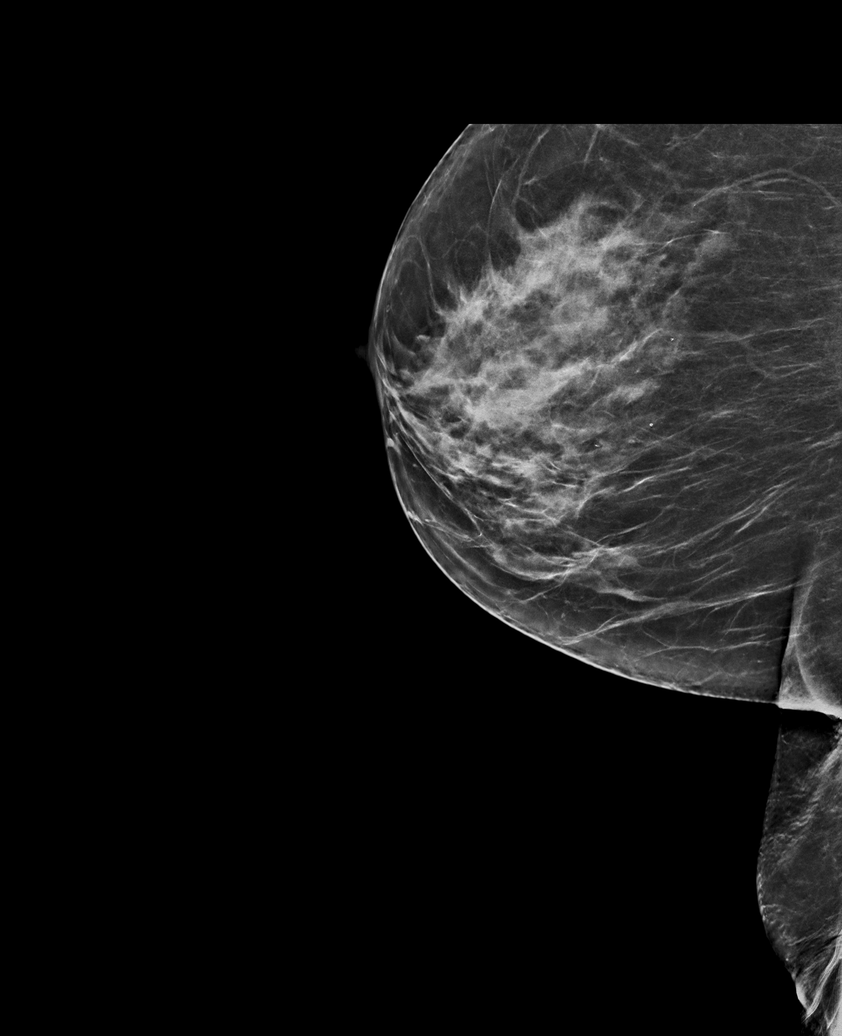

[R MLO synth-2D (2 of 2)]
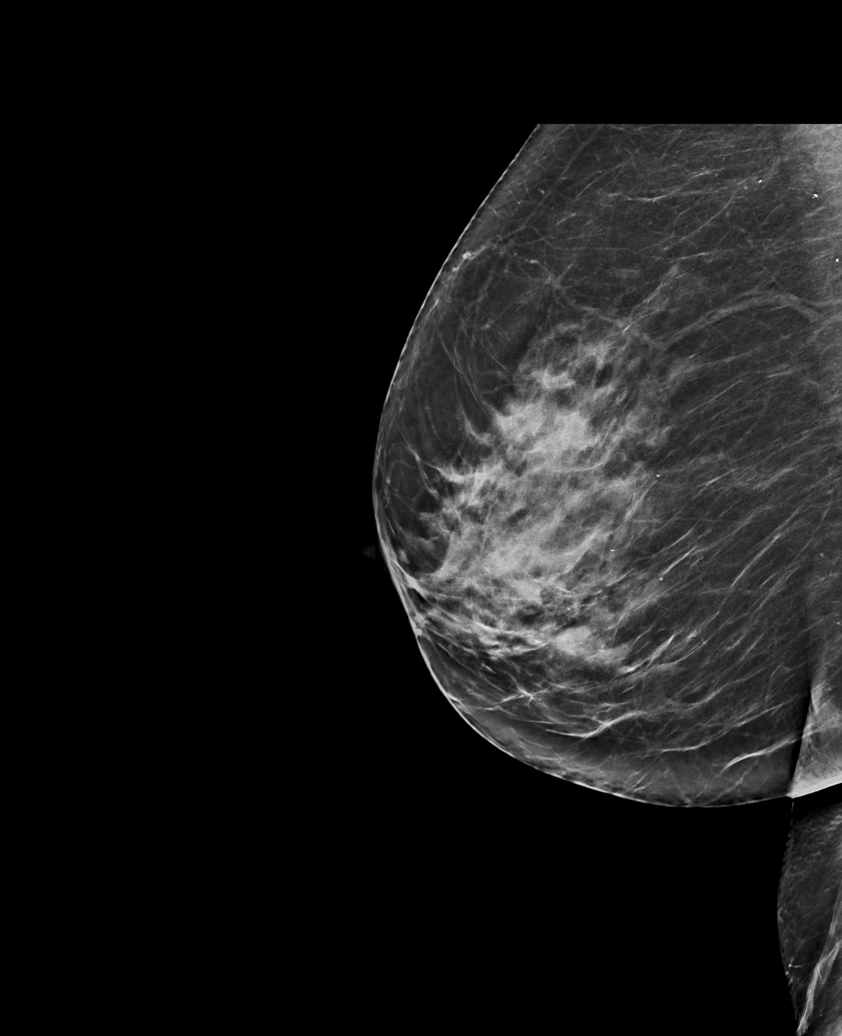

[R CV synth-2D]
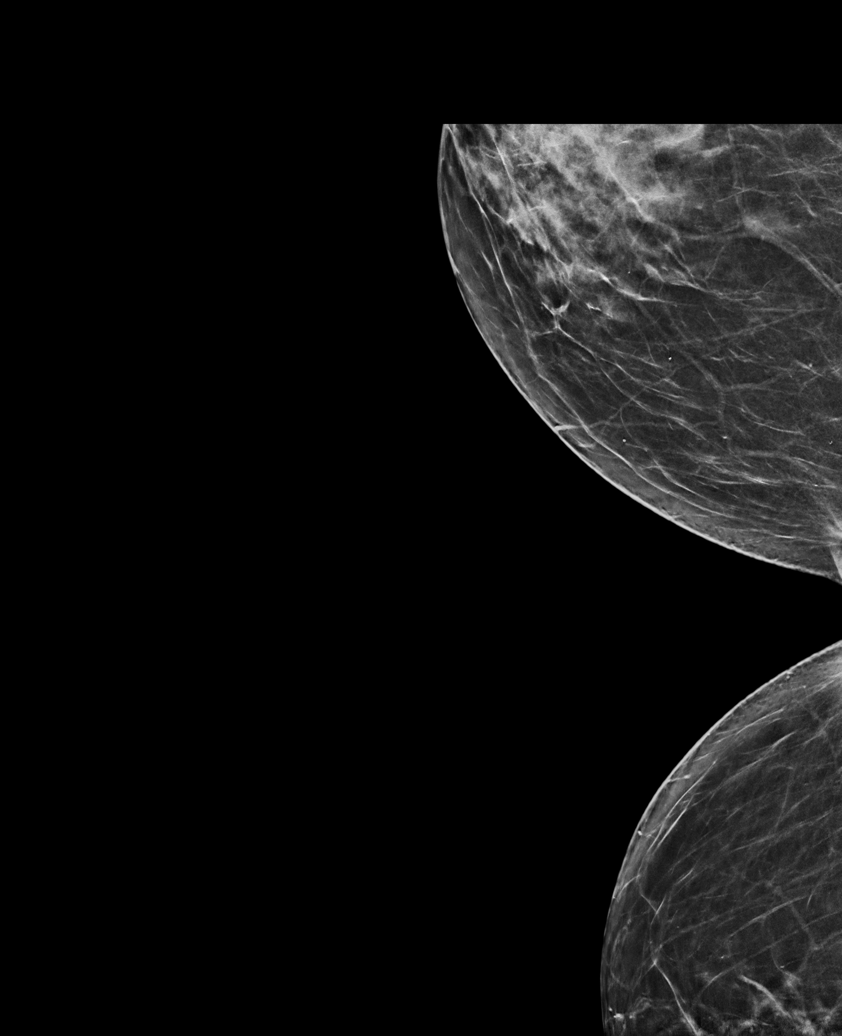

[R CC synth-2D]
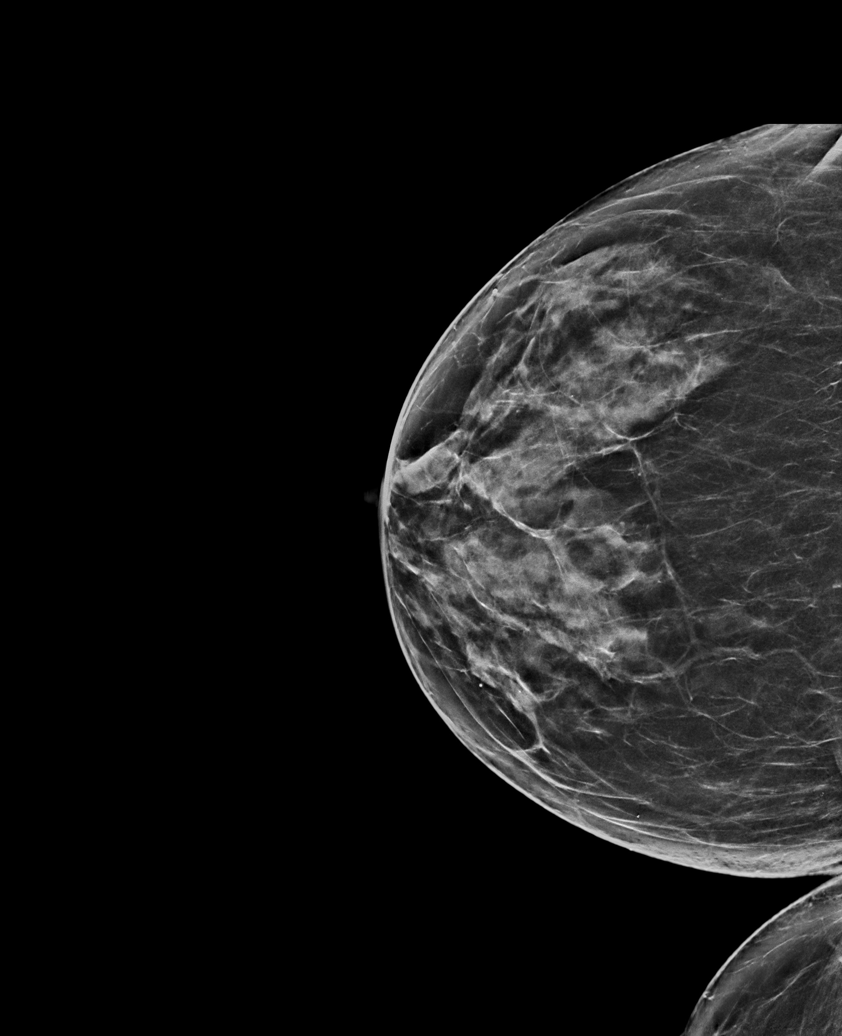

[6 of 36 positions shown; findings below may reference images not displayed]

ACR Breast Density Category c: The breast tissue is heterogeneously
dense, which may obscure small masses.
FINDINGS: In the right breast, a possible asymmetry warrants further
evaluation. In the left breast, no findings suspicious for
malignancy. Images were processed with CAD.
IMPRESSION: Further evaluation is suggested for possible asymmetry in the right
breast.

RECOMMENDATION:
Diagnostic mammogram and possibly ultrasound of the right breast.
(Code:EU-[id number])

The patient will be contacted regarding the findings, and additional
imaging will be scheduled.

BI-RADS CATEGORY  0: Incomplete. Need additional imaging evaluation
and/or prior mammograms for comparison.

## 2021-08-17 ENCOUNTER — Other Ambulatory Visit (INDEPENDENT_AMBULATORY_CARE_PROVIDER_SITE_OTHER): Payer: PPO

## 2021-08-17 ENCOUNTER — Other Ambulatory Visit: Payer: Self-pay

## 2021-08-17 DIAGNOSIS — E782 Mixed hyperlipidemia: Secondary | ICD-10-CM | POA: Diagnosis not present

## 2021-08-17 DIAGNOSIS — I10 Essential (primary) hypertension: Secondary | ICD-10-CM | POA: Diagnosis not present

## 2021-08-17 LAB — COMPREHENSIVE METABOLIC PANEL
ALT: 19 U/L (ref 0–35)
AST: 20 U/L (ref 0–37)
Albumin: 4.4 g/dL (ref 3.5–5.2)
Alkaline Phosphatase: 70 U/L (ref 39–117)
BUN: 17 mg/dL (ref 6–23)
CO2: 27 mEq/L (ref 19–32)
Calcium: 9.6 mg/dL (ref 8.4–10.5)
Chloride: 103 mEq/L (ref 96–112)
Creatinine, Ser: 0.74 mg/dL (ref 0.40–1.20)
GFR: 79.49 mL/min (ref >60.00)
Glucose, Bld: 97 mg/dL (ref 70–99)
Potassium: 4 mEq/L (ref 3.5–5.1)
Sodium: 138 mEq/L (ref 135–145)
Total Bilirubin: 0.8 mg/dL (ref 0.2–1.2)
Total Protein: 7.5 g/dL (ref 6.0–8.3)

## 2021-08-17 LAB — LIPID PANEL
Cholesterol: 232 mg/dL — ABNORMAL HIGH (ref 0–200)
HDL: 61.5 mg/dL (ref >39.00)
LDL Cholesterol: 153 mg/dL — ABNORMAL HIGH (ref 0–99)
NonHDL: 170.53
Total CHOL/HDL Ratio: 4
Triglycerides: 87 mg/dL (ref 0.0–149.0)
VLDL: 17.4 mg/dL (ref 0.0–40.0)

## 2021-08-22 ENCOUNTER — Ambulatory Visit (INDEPENDENT_AMBULATORY_CARE_PROVIDER_SITE_OTHER): Payer: PPO | Admitting: Internal Medicine

## 2021-08-22 ENCOUNTER — Other Ambulatory Visit: Payer: Self-pay

## 2021-08-22 ENCOUNTER — Encounter: Payer: Self-pay | Admitting: Internal Medicine

## 2021-08-22 VITALS — BP 132/82 | HR 85 | Temp 96.4°F | Ht 65.0 in | Wt 158.4 lb

## 2021-08-22 DIAGNOSIS — J432 Centrilobular emphysema: Secondary | ICD-10-CM | POA: Diagnosis not present

## 2021-08-22 DIAGNOSIS — I700 Atherosclerosis of aorta: Secondary | ICD-10-CM | POA: Diagnosis not present

## 2021-08-22 DIAGNOSIS — Z1231 Encounter for screening mammogram for malignant neoplasm of breast: Secondary | ICD-10-CM | POA: Diagnosis not present

## 2021-08-22 DIAGNOSIS — I10 Essential (primary) hypertension: Secondary | ICD-10-CM

## 2021-08-22 DIAGNOSIS — Z23 Encounter for immunization: Secondary | ICD-10-CM

## 2021-08-22 DIAGNOSIS — Z79899 Other long term (current) drug therapy: Secondary | ICD-10-CM | POA: Diagnosis not present

## 2021-08-22 MED ORDER — ROSUVASTATIN CALCIUM 10 MG PO TABS: 10.0000 mg | ORAL_TABLET | ORAL | 1 refills | Status: DC

## 2021-08-22 MED ORDER — SHINGRIX 50 MCG/0.5ML IM SUSR: 0.5000 mL | Freq: Once | INTRAMUSCULAR | 1 refills | Status: AC

## 2021-08-22 MED ORDER — TETANUS-DIPHTH-ACELL PERTUSSIS 5-2.5-18.5 LF-MCG/0.5 IM SUSP: 0.5000 mL | Freq: Once | INTRAMUSCULAR | 0 refills | Status: AC

## 2021-08-22 NOTE — Assessment & Plan Note (Signed)
Mammogram ordered,  breast exam done

## 2021-08-22 NOTE — Assessment & Plan Note (Addendum)
Reported changes on CTA  ; however patient is asymptomatic.

## 2021-08-22 NOTE — Patient Instructions (Addendum)
Beano with every healthy meal  Gas X or Phasyme  as needed for gas that occurs afterward    Your aortic atherosclerosis places you at increased risk for heart attack and stroke because of " placque rupture"  I recommend that you repeat a trial of Crestor  to prevent placque rupture .  Start with one tablet two times per week  (NOT DAILY)  If you tolerate taking IT ONCE A WEEK  AFTER A FEW WEEKS,  increase  your dose to EVERY OTHER DAY.   IF YOU TOLERATE THIS , PLEASE RETURN FOR LIVER TESTS AFTER A FEW WEEKS.  YOU DO NOT NEED TO FAST

## 2021-08-22 NOTE — Assessment & Plan Note (Signed)
Aortic atherosclerosis :  Discussed need for statin therapy given documented evidence of moderate  atherosclerosis in the aorta noted on recent  CT of abdomen and  pelvis and the prognostic implications of this finding. SHE has been  Deferring for years due to unfounded fears of side effects but is willing to initiate a trial of statin therapy two times weekly

## 2021-08-22 NOTE — Progress Notes (Signed)
Patient ID: Sally Morgan, female    DOB: [birth year]  Age: 75 y.o. MRN: [id number]  The patient is here for follow up and  management of other chronic and acute problems.   The risk factors are reflected in the social history.  The roster of all physicians providing medical care to patient - is listed in the Snapshot section of the chart.  Activities of daily living:  The patient is 100% independent in all ADLs: dressing, toileting, feeding as well as independent mobility  Home safety : The patient has smoke detectors in the home. They wear seatbelts.  There are no firearms at home. There is no violence in the home.   There is no risks for hepatitis, STDs or HIV. There is no   history of blood transfusion. They have no travel history to infectious disease endemic areas of the world.  The patient has seen their dentist in the last six month. They have seen their eye doctor in the last year. They admit to slight hearing difficulty with regard to whispered voices and some television programs.  They have deferred audiologic testing in the last year.  They do not  have excessive sun exposure. Discussed the need for sun protection: hats, long sleeves and use of sunscreen if there is significant sun exposure.   Diet: the importance of a healthy diet is discussed. They do have a healthy diet.  The benefits of regular aerobic exercise were discussed. She exercising 3 times per week ,  60 minutes.   Depression screen: there are no signs or vegative symptoms of depression- irritability, change in appetite, anhedonia, sadness/tearfullness.  Cognitive assessment: the patient manages all their financial and personal affairs and is actively engaged. They could relate day,date,year and events; recalled 2/3 objects at 3 minutes; performed clock-face test normally.  The following portions of the patient's history were reviewed and updated as appropriate: allergies, current medications, past family history,  past medical history,  past surgical history, past social history  and problem list.  Visual acuity was not assessed per patient preference since she has regular follow up with her ophthalmologist. Hearing and body mass index were assessed and reviewed.   During the course of the visit the patient was educated and counseled about appropriate screening and preventive services including : fall prevention , diabetes screening, nutrition counseling, colorectal cancer screening, and recommended immunizations.    CC: The primary encounter diagnosis was Long-term use of high-risk medication. Diagnoses of Encounter for screening mammogram for malignant neoplasm of breast, White coat syndrome with hypertension, Need for immunization against influenza, Breast cancer screening by mammogram, Aortic atherosclerosis ([place]), and Centrilobular emphysema ([place]) were also pertinent to this visit.  1) Aortic atherosclerosis :  Discussed need for statin therapy given documented evidence of moderate  atherosclerosis in the aorta noted on recent  CT of abdomen and  pelvis and the prognostic implications of this finding.   She remains apprehensive about statins because a previous trial of Zetia gave her "horrible gas" (and because "my husband took it and it didn't help him ")  2) sister died at 17 recently  of dementia .  She was able to spend several hours with her before she passed peacefully in her sleep.  She voiced her fear of ending up "that way"  History Sally Morgan has a past medical history of Aortic atherosclerosis ([place]), Arthritis, Dilated congestive cardiomyopathy ([place]), Diverticulitis, colon, GERD (gastroesophageal reflux disease), Hypertension, and TIA (transient ischemic attack).   She has  a past surgical history that includes Cesarean section; colonoscopy with polypectomy; and Colonoscopy with propofol (N/A, 09/18/2017).   Her family history includes Appendicitis in her father; Breast cancer in her cousin; Cancer  (age of onset: 50) in her brother; Early death in her father and mother; Tuberculosis in her mother.She reports that she has never smoked. She has never used smokeless tobacco. She reports current alcohol use of about 4.0 standard drinks per week. She reports that she does not use drugs.  Outpatient Medications Prior to Visit  Medication Sig Dispense Refill   Cholecalciferol (VITAMIN D3) 50 MCG (2000 UT) CAPS Take 1 capsule by mouth daily.     conjugated estrogens (PREMARIN) vaginal cream Apply 0.5mg  (pea-sized amount)  just inside the vaginal introitus with a finger-tip on  Monday, Wednesday and Friday nights. 30 g 12   Cranberry 1000 MG CAPS Take 2 capsules by mouth daily.     losartan (COZAAR) 25 MG tablet TAKE 1 TABLET(25 MG) BY MOUTH DAILY 90 tablet 3   Multiple Vitamins-Minerals (WOMENS MULTIVITAMIN PO) Take 1 tablet by mouth.     ezetimibe (ZETIA) 10 MG tablet Take 1 tablet (10 mg total) by mouth daily. (Patient not taking: Reported on 08/22/2021) 90 tablet 3   Omega-3 Fatty Acids (FISH OIL) 1000 MG CAPS Take 1 capsule by mouth daily. (Patient not taking: Reported on 12/15/1222)     GARLIC OIL PO Take by mouth. (Patient not taking: Reported on 08/22/2021)     tiZANidine (ZANAFLEX) 4 MG tablet Take 1 tablet (4 mg total) by mouth every 6 (six) hours as needed for muscle spasms. (Patient not taking: Reported on 08/22/2021) 30 tablet 0   Facility-Administered Medications Prior to Visit  Medication Dose Route Frequency Provider Last Rate Last Admin   betamethasone acetate-betamethasone sodium phosphate (CELESTONE) injection 3 mg  3 mg Intramuscular Once Daylene Katayama M, DPM       betamethasone acetate-betamethasone sodium phosphate (CELESTONE) injection 3 mg  3 mg Intramuscular Once Edrick Kins, DPM        Review of Systems  Patient denies headache, fevers, malaise, unintentional weight loss, skin rash, eye pain, sinus congestion and sinus pain, sore throat, dysphagia,  hemoptysis , cough,  dyspnea, wheezing, chest pain, palpitations, orthopnea, edema, abdominal pain, nausea, melena, diarrhea, constipation, flank pain, dysuria, hematuria, urinary  Frequency, nocturia, numbness, tingling, seizures,  Focal weakness, Loss of consciousness,  Tremor, insomnia, depression, anxiety, and suicidal ideation.     Objective:  BP 132/82 (BP Location: Left Arm, Patient Position: Sitting, Cuff Size: Normal)   Pulse 85   Temp (!) 96.4 F (35.8 C) (Temporal)   Ht 5\' 5"  (1.651 m)   Wt 158 lb 6.4 oz (71.8 kg)   SpO2 97%   BMI 26.36 kg/m   Physical Exam   General appearance: alert, cooperative and appears stated age Head: Normocephalic, without obvious abnormality, atraumatic Eyes: conjunctivae/corneas clear. PERRL, EOM's intact. Fundi benign. Ears: normal TM's and external ear canals both ears Nose: Nares normal. Septum midline. Mucosa normal. No drainage or sinus tenderness. Throat: lips, mucosa, and tongue normal; teeth and gums normal Neck: no adenopathy, no carotid bruit, no JVD, supple, symmetrical, trachea midline and thyroid not enlarged, symmetric, no tenderness/mass/nodules Lungs: clear to auscultation bilaterally Breasts: normal appearance, no masses or tenderness Heart: regular rate and rhythm, S1, S2 normal, no murmur, click, rub or gallop Abdomen: soft, non-tender; bowel sounds normal; no masses,  no organomegaly Extremities: extremities normal, atraumatic, no cyanosis or edema Pulses: 2+  and symmetric Skin: Skin color, texture, turgor normal. No rashes or lesions Neurologic: Alert and oriented X 3, normal strength and tone. Normal symmetric reflexes. Normal coordination and gait.     Assessment & Plan:   Problem List Items Addressed This Visit     Breast cancer screening by mammogram    Mammogram ordered,  breast exam done       White coat syndrome with hypertension   Relevant Medications   rosuvastatin (CRESTOR) 10 MG tablet   Aortic atherosclerosis ([place])     Aortic atherosclerosis :  Discussed need for statin therapy given documented evidence of moderate  atherosclerosis in the aorta noted on recent  CT of abdomen and  pelvis and the prognostic implications of this finding. SHE has been  Deferring for years due to unfounded fears of side effects but is willing to initiate a trial of statin therapy two times weekly      Relevant Medications   rosuvastatin (CRESTOR) 10 MG tablet   Centrilobular emphysema ([place])    Reported changes on CTA  ; however patient is asymptomatic.        Other Visit Diagnoses     Long-term use of high-risk medication    -  Primary   Relevant Orders   Comprehensive metabolic panel   Encounter for screening mammogram for malignant neoplasm of breast       Relevant Orders   MM 3D SCREEN BREAST BILATERAL   Need for immunization against influenza       Relevant Orders   Flu Vaccine QUAD High Dose(Fluad) (Completed)       I have discontinued Jennette Banker. Sohail's GARLIC OIL PO and tiZANidine. I am also having her start on Tdap, Shingrix, and rosuvastatin. Additionally, I am having her maintain her Multiple Vitamins-Minerals (WOMENS MULTIVITAMIN PO), Fish Oil, Cranberry, vitamin D3, Premarin, ezetimibe, and losartan. We will continue to administer betamethasone acetate-betamethasone sodium phosphate and betamethasone acetate-betamethasone sodium phosphate.  Meds ordered this encounter  Medications   Tdap (BOOSTRIX) 5-2.5-18.5 LF-MCG/0.5 injection    Sig: Inject 0.5 mLs into the muscle once for 1 dose.    Dispense:  0.5 mL    Refill:  0   Zoster Vaccine Adjuvanted [facility]) injection    Sig: Inject 0.5 mLs into the muscle once for 1 dose.    Dispense:  0.5 mL    Refill:  1   rosuvastatin (CRESTOR) 10 MG tablet    Sig: Take 1 tablet (10 mg total) by mouth every other day.    Dispense:  45 tablet    Refill:  1    Medications Discontinued During This Encounter  Medication Reason   GARLIC OIL PO    tiZANidine  (ZANAFLEX) 4 MG tablet     Follow-up: No follow-ups on file.   Crecencio Mc, MD

## 2021-09-13 DIAGNOSIS — Z03818 Encounter for observation for suspected exposure to other biological agents ruled out: Secondary | ICD-10-CM | POA: Diagnosis not present

## 2021-09-13 DIAGNOSIS — Z20822 Contact with and (suspected) exposure to covid-19: Secondary | ICD-10-CM | POA: Diagnosis not present

## 2021-09-14 ENCOUNTER — Ambulatory Visit (INDEPENDENT_AMBULATORY_CARE_PROVIDER_SITE_OTHER): Payer: PPO | Admitting: Nurse Practitioner

## 2021-09-14 ENCOUNTER — Encounter: Payer: Self-pay | Admitting: Nurse Practitioner

## 2021-09-14 VITALS — BP 151/92 | HR 105 | Temp 98.9°F

## 2021-09-14 DIAGNOSIS — U071 COVID-19: Secondary | ICD-10-CM

## 2021-09-14 HISTORY — DX: COVID-19: U07.1

## 2021-09-14 MED ORDER — MOLNUPIRAVIR EUA 200MG CAPSULE: 4.0000 | ORAL_CAPSULE | Freq: Two times a day (BID) | ORAL | 0 refills | Status: AC

## 2021-09-14 NOTE — Progress Notes (Signed)
Patient ID: Sally [place], female    DOB: [birth year], 75 y.o.   MRN: [id number]  Virtual visit completed through [employer], a video enabled telemedicine application. Due to national recommendations of social distancing due to COVID-19, a virtual visit is felt to be most appropriate for this patient at this time. Reviewed limitations, risks, security and privacy concerns of performing a virtual visit and the availability of in person appointments. I also reviewed that there may be a patient responsible charge related to this service. The patient agreed to proceed.   Unable to connect via video enabled program, reverted to telephone encounter. Phone call lasted 12 minutes and 25 seconds  Patient location: home Provider location: [place] [facility], office Persons participating in this virtual visit: patient, provider   If any vitals were documented, they were collected by patient at home unless specified below.    BP (!) 151/92   Pulse (!) 105   Temp 98.9 F (37.2 C) Comment: vitals per patient   CC: Covid 19 Subjective:   HPI: SYNAI Morgan is a 75 y.o. female presenting on 09/14/2021 for Covid Positive (On 09/13/21, sx started on 09/12/21-chills, headache, off and on stuffy nose, cough-clear phlegm, temp-around 99ish, ringing in the ear. Has been taking tylenol.)  Symptoms 09/12/2021 Test at home 09/13/2021 Vaccine pfizer x2 No sick contacts No OTC treatment Tylenol for chills, helped   Relevant past medical, surgical, family and social history reviewed and updated as indicated. Interim medical history since our last visit reviewed. Allergies and medications reviewed and updated. Outpatient Medications Prior to Visit  Medication Sig Dispense Refill   Cholecalciferol (VITAMIN D3) 50 MCG (2000 UT) CAPS Take 1 capsule by mouth daily.     conjugated estrogens (PREMARIN) vaginal cream Apply 0.5mg  (pea-sized amount)  just inside the vaginal introitus with a finger-tip on   Monday, Wednesday and Friday nights. 30 g 12   Cranberry 1000 MG CAPS Take 2 capsules by mouth daily.     losartan (COZAAR) 25 MG tablet TAKE 1 TABLET(25 MG) BY MOUTH DAILY 90 tablet 3   Multiple Vitamins-Minerals (ICAPS AREDS 2 PO) Take by mouth.     Multiple Vitamins-Minerals (WOMENS MULTIVITAMIN PO) Take 1 tablet by mouth.     ezetimibe (ZETIA) 10 MG tablet Take 1 tablet (10 mg total) by mouth daily. (Patient not taking: Reported on 08/22/2021) 90 tablet 3   Omega-3 Fatty Acids (FISH OIL) 1000 MG CAPS Take 1 capsule by mouth daily. (Patient not taking: Reported on 08/22/2021)     rosuvastatin (CRESTOR) 10 MG tablet Take 1 tablet (10 mg total) by mouth every other day. 45 tablet 1   Facility-Administered Medications Prior to Visit  Medication Dose Route Frequency Provider Last Rate Last Admin   betamethasone acetate-betamethasone sodium phosphate (CELESTONE) injection 3 mg  3 mg Intramuscular Once Daylene Katayama M, DPM       betamethasone acetate-betamethasone sodium phosphate (CELESTONE) injection 3 mg  3 mg Intramuscular Once Edrick Kins, DPM         Per HPI unless specifically indicated in ROS section below Review of Systems  Constitutional:  Positive for chills, fatigue and fever.  HENT:  Positive for congestion, ear pain (right) and postnasal drip. Negative for ear discharge, sinus pressure, sinus pain and sore throat.   Respiratory:  Positive for cough (clear). Negative for shortness of breath.   Cardiovascular:  Negative for chest pain.  Gastrointestinal:  Negative for abdominal pain, diarrhea, nausea and vomiting.  Musculoskeletal:  Negative for arthralgias and myalgias.  Neurological:  Positive for headaches.  Objective:  BP (!) 151/92   Pulse (!) 105   Temp 98.9 F (37.2 C) Comment: vitals per patient  Wt Readings from Last 3 Encounters:  08/22/21 158 lb 6.4 oz (71.8 kg)  12/23/20 161 lb (73 kg)  08/09/20 155 lb (70.3 kg)       Physical exam: Gen: alert, NAD, not ill  appearing Pulm: speaks in complete sentences without increased work of breathing Psych: normal mood, normal thought content      Results for orders placed or performed in visit on 08/17/21  Comprehensive metabolic panel  Result Value Ref Range   Sodium 138 135 - 145 mEq/L   Potassium 4.0 3.5 - 5.1 mEq/L   Chloride 103 96 - 112 mEq/L   CO2 27 19 - 32 mEq/L   Glucose, Bld 97 70 - 99 mg/dL   BUN 17 6 - 23 mg/dL   Creatinine, Ser 0.74 0.40 - 1.20 mg/dL   Total Bilirubin 0.8 0.2 - 1.2 mg/dL   Alkaline Phosphatase 70 39 - 117 U/L   AST 20 0 - 37 U/L   ALT 19 0 - 35 U/L   Total Protein 7.5 6.0 - 8.3 g/dL   Albumin 4.4 3.5 - 5.2 g/dL   GFR 79.49 >60.00 mL/min   Calcium 9.6 8.4 - 10.5 mg/dL  Lipid panel  Result Value Ref Range   Cholesterol 232 (H) 0 - 200 mg/dL   Triglycerides 87.0 0.0 - 149.0 mg/dL   HDL 61.50 >39.00 mg/dL   VLDL 17.4 0.0 - 40.0 mg/dL   LDL Cholesterol 153 (H) 0 - 99 mg/dL   Total CHOL/HDL Ratio 4    NonHDL 170.53    Assessment & Plan:   Problem List Items Addressed This Visit       Other   COVID-19 - Primary    Patient tested positive for COVID-19 did discuss oral antiviral treatments and they were EUA currently.  Patient has some hesitation to try and offer comfort in regards to use the medication frequently with good tolerance for folks.  Did discuss risk factors with patient inclusive of age and cardiomyopathy.  Patient has been vaccinated.  Did send antiviral to pharmacy to give parameters as when she needs to start medication if she decides to take it patient knowledge understood also discussed signs and symptoms as when to seek urgent or emergent health care and quarantine guidelines in regards to [facility] recommendations.      Relevant Medications   molnupiravir EUA (LAGEVRIO) 200 mg CAPS capsule    Patient hesitant on starting medications because of EUA. Gave parameters as when she would need to start the medication No orders of the defined types were  placed in this encounter.  No orders of the defined types were placed in this encounter.   I discussed the assessment and treatment plan with the patient. The patient was provided an opportunity to ask questions and all were answered. The patient agreed with the plan and demonstrated an understanding of the instructions. The patient was advised to call back or seek an in-person evaluation if the symptoms worsen or if the condition fails to improve as anticipated.  Follow up plan: No follow-ups on file.  Romilda Garret, NP

## 2021-09-14 NOTE — Assessment & Plan Note (Signed)
Patient tested positive for COVID-19 did discuss oral antiviral treatments and they were EUA currently.  Patient has some hesitation to try and offer comfort in regards to use the medication frequently with good tolerance for folks.  Did discuss risk factors with patient inclusive of age and cardiomyopathy.  Patient has been vaccinated.  Did send antiviral to pharmacy to give parameters as when she needs to start medication if she decides to take it patient knowledge understood also discussed signs and symptoms as when to seek urgent or emergent health care and quarantine guidelines in regards to [facility] recommendations.

## 2021-09-27 DIAGNOSIS — R079 Chest pain, unspecified: Secondary | ICD-10-CM | POA: Diagnosis not present

## 2021-09-27 DIAGNOSIS — R058 Other specified cough: Secondary | ICD-10-CM | POA: Diagnosis not present

## 2021-09-27 DIAGNOSIS — R059 Cough, unspecified: Secondary | ICD-10-CM | POA: Diagnosis not present

## 2021-09-27 DIAGNOSIS — J209 Acute bronchitis, unspecified: Secondary | ICD-10-CM | POA: Diagnosis not present

## 2021-09-27 DIAGNOSIS — R0789 Other chest pain: Secondary | ICD-10-CM | POA: Diagnosis not present

## 2021-12-01 DIAGNOSIS — H353132 Nonexudative age-related macular degeneration, bilateral, intermediate dry stage: Secondary | ICD-10-CM | POA: Diagnosis not present

## 2021-12-18 DIAGNOSIS — L720 Epidermal cyst: Secondary | ICD-10-CM | POA: Diagnosis not present

## 2021-12-18 DIAGNOSIS — D1722 Benign lipomatous neoplasm of skin and subcutaneous tissue of left arm: Secondary | ICD-10-CM | POA: Diagnosis not present

## 2021-12-18 DIAGNOSIS — L821 Other seborrheic keratosis: Secondary | ICD-10-CM | POA: Diagnosis not present

## 2022-01-06 ENCOUNTER — Other Ambulatory Visit: Payer: Self-pay | Admitting: Cardiovascular Disease

## 2022-01-08 ENCOUNTER — Ambulatory Visit (INDEPENDENT_AMBULATORY_CARE_PROVIDER_SITE_OTHER): Payer: PPO

## 2022-01-08 ENCOUNTER — Telehealth: Payer: Self-pay | Admitting: Cardiovascular Disease

## 2022-01-08 VITALS — Ht 65.0 in | Wt 158.0 lb

## 2022-01-08 DIAGNOSIS — Z0000 Encounter for general adult medical examination without abnormal findings: Secondary | ICD-10-CM

## 2022-01-08 DIAGNOSIS — I77810 Thoracic aortic ectasia: Secondary | ICD-10-CM

## 2022-01-08 NOTE — Telephone Encounter (Signed)
Please schedule 12 month F/U appointment. Thank you! 

## 2022-01-08 NOTE — Telephone Encounter (Signed)
Patient wants echo before ROV.   ?

## 2022-01-08 NOTE — Progress Notes (Addendum)
Subjective:   Sally Morgan is a 76 y.o. female who presents for Medicare Annual (Subsequent) preventive examination.  Review of Systems    No ROS.  Medicare Wellness Virtual Visit.  Visual/audio telehealth visit, UTA vital signs.   See social history for additional risk factors.   Cardiac Risk Factors include: advanced age (>33men, >32 women)     Objective:    Today's Vitals   01/08/22 0936  Weight: 158 lb (71.7 kg)  Height: 5\' 5"  (1.651 m)   Body mass index is 26.29 kg/m.     01/08/2022   10:23 AM 06/06/2020    9:11 AM 06/04/2019    9:27 AM 04/09/2018   11:28 AM 09/18/2017    8:13 AM 04/08/2017   11:26 AM 10/12/2015    9:48 AM  Advanced Directives  Does Patient Have a Medical Advance Directive? Yes Yes Yes Yes Yes Yes Yes  Type of Estate agent of [place];Living will Healthcare Power of [place];Living will Healthcare Power of [place];Living will Healthcare Power of [place];Living will Living will Living will;Healthcare Power of [employer] Power of [place];Living will  Does patient want to make changes to medical advance directive? No - Patient declined No - Patient declined No - Patient declined No - Patient declined  No - Patient declined No - Patient declined  Copy of Healthcare Power of Attorney in Chart? No - copy requested No - copy requested No - copy requested No - copy requested  No - copy requested No - copy requested   Current Medications (verified) Outpatient Encounter Medications as of 01/08/2022  Medication Sig   Cholecalciferol (VITAMIN D3) 50 MCG (2000 UT) CAPS Take 1 capsule by mouth daily.   conjugated estrogens (PREMARIN) vaginal cream Apply 0.5mg  (pea-sized amount)  just inside the vaginal introitus with a finger-tip on  Monday, Wednesday and Friday nights.   Cranberry 1000 MG CAPS Take 2 capsules by mouth daily.   losartan (COZAAR) 25 MG tablet TAKE 1 TABLET(25 MG) BY MOUTH DAILY   Multiple Vitamins-Minerals (ICAPS  AREDS 2 PO) Take by mouth.   Multiple Vitamins-Minerals (WOMENS MULTIVITAMIN PO) Take 1 tablet by mouth.   Facility-Administered Encounter Medications as of 01/08/2022  Medication   betamethasone acetate-betamethasone sodium phosphate (CELESTONE) injection 3 mg   betamethasone acetate-betamethasone sodium phosphate (CELESTONE) injection 3 mg   Allergies (verified) Lisinopril, Meloxicam, and Nickel   History: Past Medical History:  Diagnosis Date   Aortic atherosclerosis (HCC)    Arthritis    Dilated congestive cardiomyopathy (HCC)    Diverticulitis, colon    GERD (gastroesophageal reflux disease)    Hypertension    TIA (transient ischemic attack)    Past Surgical History:  Procedure Laterality Date   CESAREAN SECTION     x 2    colonoscopy with polypectomy     COLONOSCOPY WITH PROPOFOL N/A 09/18/2017   Procedure: COLONOSCOPY WITH PROPOFOL;  Surgeon: Scot Jun, MD;  Location: [facility];  Service: Endoscopy;  Laterality: N/A;   Family History  Problem Relation Age of Onset   Cancer Brother 29       colon ca   Early death Mother    Tuberculosis Mother    Early death Father    Appendicitis Father    Breast cancer Cousin        paternal   Social History   Socioeconomic History   Marital status: Widowed    Spouse name: Not on file   Number of children: Not on file  Years of education: Not on file   Highest education level: Not on file  Occupational History   Not on file  Tobacco Use   Smoking status: Never   Smokeless tobacco: Never  Vaping Use   Vaping Use: Never used  Substance and Sexual Activity   Alcohol use: Yes    Alcohol/week: 4.0 standard drinks    Types: 1 Glasses of wine, 3 Standard drinks or equivalent per week    Comment: OCC   Drug use: No   Sexual activity: Not Currently  Other Topics Concern   Not on file  Social History Narrative   Not on file   Social Determinants of Health   Financial Resource Strain: Low Risk     Difficulty of Paying Living Expenses: Not hard at all  Food Insecurity: No Food Insecurity   Worried About Programme researcher, broadcasting/film/video in the Last Year: Never true   Ran Out of Food in the Last Year: Never true  Transportation Needs: No Transportation Needs   Lack of Transportation (Medical): No   Lack of Transportation (Non-Medical): No  Physical Activity: Insufficiently Active   Days of Exercise per Week: 3 days   Minutes of Exercise per Session: 30 min  Stress: No Stress Concern Present   Feeling of Stress : Not at all  Social Connections: Unknown   Frequency of Communication with Friends and Family: Not on file   Frequency of Social Gatherings with Friends and Family: Not on file   Attends Religious Services: Not on Scientist, clinical (histocompatibility and immunogenetics) or Organizations: Not on file   Attends Banker Meetings: Not on file   Marital Status: Married    Tobacco Counseling Counseling given: Not Answered   Clinical Intake:  Pre-visit preparation completed: Yes        Diabetes: No  How often do you need to have someone help you when you read instructions, pamphlets, or other written materials from your doctor or pharmacy?: 1 - Never  Interpreter Needed?: No      Activities of Daily Living    01/08/2022   10:25 AM  In your present state of health, do you have any difficulty performing the following activities:  Hearing? 0  Vision? 0  Difficulty concentrating or making decisions? 0  Walking or climbing stairs? 0  Dressing or bathing? 0  Doing errands, shopping? 0  Preparing Food and eating ? N  Using the Toilet? N  In the past six months, have you accidently leaked urine? N  Do you have problems with loss of bowel control? N  Managing your Medications? N  Managing your Finances? N  Housekeeping or managing your Housekeeping? N    Patient Care Team: Sherlene Shams, MD as PCP - General (Internal Medicine) Antonieta Iba, MD as Consulting Physician  (Cardiology)  Indicate any recent Medical Services you may have received from other than [facility] providers in the past year (date may be approximate).     Assessment:   This is a routine wellness examination for Sally Morgan.  Virtual Visit via Telephone Note  I connected with  Sally Morgan on 01/08/22 at  9:30 AM EDT by telephone and verified that I am speaking with the correct person using two identifiers.  Persons participating in the virtual visit: patient/Nurse Health Advisor   I discussed the limitations of performing an evaluation and management service by telehealth. The patient expressed understanding and agreed to proceed. We continued and completed visit with  audio only.  Some vital signs may be absent or patient reported.   Hearing/Vision screen Hearing Screening - Comments:: Patient is able to hear conversational tones without difficulty.  No issues reported. Vision Screening - Comments:: Wears corrective lenses when reading. Age related macular degeneration.   Dietary issues and exercise activities discussed: Current Exercise Habits: Home exercise routine, Type of exercise: walking, Intensity: Mild Regular diet Good water    Goals Addressed             This Visit's Progress    Follow up with Primary Care Provider       As needed       Depression Screen    01/08/2022   10:05 AM 08/22/2021    3:44 PM 06/21/2020    2:56 PM 06/06/2020    9:06 AM 06/04/2019    9:20 AM 03/30/2019   11:30 AM 04/09/2018   11:28 AM  PHQ 2/9 Scores  PHQ - 2 Score 0 0 2 0 0 0 0  PHQ- 9 Score   6   0     Fall Risk    01/08/2022   10:25 AM 08/22/2021    3:44 PM 07/29/2020    4:18 PM 06/21/2020    2:16 PM 06/06/2020    9:11 AM  Fall Risk   Falls in the past year? 0 0 0 0 0  Number falls in past yr: 0    0  Risk for fall due to :  No Fall Risks     Follow up Falls evaluation completed Falls evaluation completed Falls evaluation completed Falls evaluation completed Falls evaluation  completed   FALL RISK PREVENTION PERTAINING TO THE HOME: Home free of loose throw rugs in walkways, pet beds, electrical cords, etc? Yes  Adequate lighting in your home to reduce risk of falls? Yes   ASSISTIVE DEVICES UTILIZED TO PREVENT FALLS: Life alert? No  Use of a cane, walker or w/c? No   TIMED UP AND GO: Was the test performed? No .   Cognitive Function: Patient is alert and oriented x3.  Normal cognitive status assessed by direct observation/communication. No abnormalities found.      04/09/2018   11:34 AM 04/08/2017   11:37 AM 10/12/2015   10:00 AM  MMSE - Mini Mental State Exam  Orientation to time 5 5 5   Orientation to Place 5 5 5   Registration 3 3 3   Attention/ Calculation 5 5 5   Recall 2 3 3   Language- name 2 objects 2 2 2   Language- repeat 1 1 1   Language- follow 3 step command 3 3 3   Language- read & follow direction 1 1 1   Write a sentence 1 1 1   Copy design 1 1 1   Total score 29 30 30         06/04/2019    9:26 AM  6CIT Screen  What Year? 0 points  What month? 0 points  What time? 0 points  Count back from 20 0 points  Months in reverse 0 points  Repeat phrase 0 points  Total Score 0 points    Immunizations Immunization History  Administered Date(s) Administered   Fluad Quad(high Dose 65+) 07/28/2019, 07/29/2020, 08/22/2021   Influenza Whole 08/06/2010   Influenza-Unspecified 09/13/2018   PFIZER(Purple Top)SARS-COV-2 Vaccination 11/03/2019, 11/23/2019   Pneumococcal Conjugate-13 11/02/2013   Pneumococcal Polysaccharide-23 12/08/2015   Td 02/04/2003   Screening Tests Health Maintenance  Topic Date Due   MAMMOGRAM  01/10/2021   COVID-19 Vaccine (3 -  Pfizer risk series) 01/24/2022 (Originally 12/21/2019)   Zoster Vaccines- Shingrix (1 of 2) 04/10/2022 (Originally 10/28/1964)   TETANUS/TDAP  08/22/2022 (Originally 02/03/2013)   COLONOSCOPY (Pts 45-66yrs Insurance coverage will need to be confirmed)  09/18/2022   Pneumonia Vaccine 70+ Years old   Completed   INFLUENZA VACCINE  Completed   DEXA SCAN  Completed   Hepatitis C Screening  Completed   HPV VACCINES  Aged Out   Health Maintenance Health Maintenance Due  Topic Date Due   MAMMOGRAM  01/10/2021   Colonoscopy- due 09/2022.   Mammogram- plans to schedule.   Lung Cancer Screening: (Low Dose CT Chest recommended if Age 61-80 years, 30 pack-year currently smoking OR have quit w/in 15years.) does not qualify.   Vision Screening: Recommended annual ophthalmology exams for early detection of glaucoma and other disorders of the eye.  Dental Screening: Recommended annual dental exams for proper oral hygiene  Community Resource Referral / Chronic Care Management: CRR required this visit?  No   CCM required this visit?  No      Plan:   Keep all routine maintenance appointments.   I have personally reviewed and noted the following in the patient's chart:   Medical and social history Use of alcohol, tobacco or illicit drugs  Current medications and supplements including opioid prescriptions.  Functional ability and status Nutritional status Physical activity Advanced directives List of other physicians Hospitalizations, surgeries, and ER visits in previous 12 months Vitals Screenings to include cognitive, depression, and falls Referrals and appointments  In addition, I have reviewed and discussed with patient certain preventive protocols, quality metrics, and best practice recommendations. A written personalized care plan for preventive services as well as general preventive health recommendations were provided to patient.     OBrien-Blaney, Sally Odonovan Morgan, Sally Morgan   [birth year]     I have reviewed the above information and agree with above.   Duncan Dull, MD

## 2022-01-08 NOTE — Telephone Encounter (Signed)
Attempted to schedule.  LMOV to call office.  ° °

## 2022-01-08 NOTE — Patient Instructions (Addendum)
?  Sally Morgan , ?Thank you for taking time to come for your Medicare Wellness Visit. I appreciate your ongoing commitment to your health goals. Please review the following plan we discussed and let me know if I can assist you in the future.  ? ?These are the goals we discussed: ? Goals   ? ?  Follow up with Primary Care Provider   ?  As needed ?  ? ?  ?  ?This is a list of the screening recommended for you and due dates:  ?Health Maintenance  ?Topic Date Due  ? Mammogram  01/10/2021  ? COVID-19 Vaccine (3 - Pfizer risk series) 01/24/2022*  ? Zoster (Shingles) Vaccine (1 of 2) 04/10/2022*  ? Tetanus Vaccine  08/22/2022*  ? Colon Cancer Screening  09/18/2022  ? Pneumonia Vaccine  Completed  ? Flu Shot  Completed  ? DEXA scan (bone density measurement)  Completed  ? Hepatitis C Screening: USPSTF Recommendation to screen - Ages 50-79 yo.  Completed  ? HPV Vaccine  Aged Out  ?*Topic was postponed. The date shown is not the original due date.  ?  ?

## 2022-01-15 ENCOUNTER — Other Ambulatory Visit: Payer: Self-pay

## 2022-01-15 ENCOUNTER — Telehealth: Payer: Self-pay | Admitting: Cardiovascular Disease

## 2022-01-15 MED ORDER — LOSARTAN POTASSIUM 25 MG PO TABS: ORAL_TABLET | ORAL | 3 refills | Status: DC

## 2022-01-15 NOTE — Telephone Encounter (Signed)
losartan (COZAAR) 25 MG tablet 90 tablet 3 01/15/2022    ?Sig: TAKE 1 TABLET(25 MG) BY MOUTH DAILY   ?Sent to pharmacy as: losartan (COZAAR) 25 MG tablet   ?E-Prescribing Status: Receipt confirmed by pharmacy (01/15/2022  3:00 PM EDT)   ? ?Pharmacy ? ?[id number] - [place], [place]  ? ?

## 2022-01-15 NOTE — Telephone Encounter (Signed)
?*  STAT* If patient is at the pharmacy, call can be transferred to refill team. ? ? ?1. Which medications need to be refilled? (please list name of each medication and dose if known)  ? losartan (COZAAR) 25 MG tablet  ? ? ?2. Which pharmacy/location (including street and city if local pharmacy) is medication to be sent to?[id number] - [place], [place] ? ?3. Do they need a 30 day or 90 day supply? 90 day ?

## 2022-01-29 ENCOUNTER — Ambulatory Visit (INDEPENDENT_AMBULATORY_CARE_PROVIDER_SITE_OTHER): Payer: PPO

## 2022-01-29 DIAGNOSIS — I77810 Thoracic aortic ectasia: Secondary | ICD-10-CM | POA: Diagnosis not present

## 2022-01-29 DIAGNOSIS — I700 Atherosclerosis of aorta: Secondary | ICD-10-CM

## 2022-01-29 LAB — ECHOCARDIOGRAM COMPLETE
AR max vel: 3.98 cm2
AV Area VTI: 3.76 cm2
AV Area mean vel: 3.94 cm2
AV Mean grad: 2 mmHg
AV Peak grad: 4 mmHg
AV Vena cont: 0.4 cm
Ao pk vel: 1 m/s
Area-P 1/2: 4.49 cm2
Calc EF: 64 %
MV M vel: 4.95 m/s
MV Peak grad: 98 mmHg
P 1/2 time: 660 msec
S' Lateral: 3 cm
Single Plane A2C EF: 66.4 %
Single Plane A4C EF: 59.6 %

## 2022-01-31 ENCOUNTER — Telehealth: Payer: Self-pay | Admitting: Emergency Medicine

## 2022-01-31 NOTE — Telephone Encounter (Signed)
Called and spoke with patient. Results reviewed with patient, pt verbalized understanding,  questions (if any) answered.   ?

## 2022-01-31 NOTE — Telephone Encounter (Signed)
-----   Message from Minna Merritts, MD sent at 01/29/2022  5:21 PM EDT ----- ?Echocardiogram ?Normal left and right ventricular size and function ?No significant valvular heart disease ?Appears to have normal pressures ?Aortic root size unchanged ?Overall good study ?

## 2022-02-19 NOTE — Progress Notes (Signed)
Cardiology Office Note ? ?Date:  02/20/2022  ? ?ID:  Sally Morgan, DOB 1945-11-15, MRN [id number] ? ?PCP:  Crecencio Mc, MD  ? ?Chief Complaint  ?Patient presents with  ? 12 month follow up   ?  Discuss Echo results. "Doing well." Medications reviewed by the patient verbally. ?  ? ? ?HPI:  ?Sally Morgan  is a pleasant 76 year-old woman who has a history of  ?hyperlipidemia,  ?vein ablation in on the left  ?post procedure DVT noted on ultrasound.  ?started on aspirin,  changed to xarelto twice a day after the thrombus extended, for 2 weeks ?Dilated aortic root 3.7 to 3.9 centimeters ?Baseline EKG with diffuse T-wave abnormality ?Mild plaque in the aorta CT scan 2015, 2022 ? She presents for routine followup of her hyperlipidemia and dilated aortic root, aortic atherosclerosis ? ?Last seen by myself in clinic March 2022 ? ?Recent echocardiogram results from January 29, 2022 reviewed on today's visit ? 1. Left ventricular ejection fraction, by estimation, is 60 to 65%. The  ?left ventricle has normal function. The left ventricle has no regional  ?wall motion abnormalities. Left ventricular diastolic parameters are  ?consistent with Grade II diastolic  ?dysfunction (pseudonormalization). The average left ventricular global  ?longitudinal strain is -19.7 %. The global longitudinal strain is normal.  ? 2. Right ventricular systolic function is normal. The right ventricular  ?size is normal.  ? 3. Left atrial size was mildly dilated.  ? 4. The mitral valve is normal in structure. Mild mitral valve  ?regurgitation.  ? 5. The aortic valve is tricuspid. Aortic valve regurgitation is mild.  ? 6. Aortic dilatation noted. There is borderline dilatation of the aortic  ?root, measuring 38 mm.  ? 7. The inferior vena cava is normal in size with greater than 50%  ?respiratory variability, suggesting right atrial pressure of 3 mmHg.  ? ?Restless legs, does not want a statin reports having statin myalgias ? ?CT chest images pulled up  and reviewed on today's visit ?Mild to moderate aortic atherosclerosis, mild to moderate coronary calcification notably in the LAD ? ?Cholesterol running around 220 ? ?Aorta size unchanged based on echo ? ?Denies chest pain concerning for angina, good exercise tolerance ? ?EKG personally reviewed by myself on todays visit ?Normal sinus rhythm with rate 70 bpm T wave abnormality precordial leads anterolateral V3 through V6, unchanged going back several years ? ? previous echocardiograms and CT scans ? CT scan 2015 ?CT scan chest 2015 showing ascending aortic dilatation 3.7 cm ?Mild  aortic arch plaque, minimal descending aorta plaque, no coronary calcification ? ?Echo, numbers reviewed with her today ?Echo 11/2019: aorta 3.9 cm, reviewed in detail today ? stable aortic root 4.1 cm in 2016 ?echocardiogram 3.5 cm in 2017 ?Echocardiogram in 2015 showed aortic root 4.2 cm with ejection fraction 45-50%, mildly dilated left ventricle (possible stress cardiomyopathy) ? ? ?PMH:   has a past medical history of Aortic atherosclerosis ([place]), Arthritis, Dilated congestive cardiomyopathy ([place]), Diverticulitis, colon, GERD (gastroesophageal reflux disease), Hypertension, and TIA (transient ischemic attack). ? ?PSH:    ?Past Surgical History:  ?Procedure Laterality Date  ? CESAREAN SECTION    ? x 2   ? colonoscopy with polypectomy    ? COLONOSCOPY WITH PROPOFOL N/A 09/18/2017  ? Procedure: COLONOSCOPY WITH PROPOFOL;  Surgeon: Manya Silvas, MD;  Location: [facility] ENDOSCOPY;  Service: Endoscopy;  Laterality: N/A;  ? ? ?Current Outpatient Medications  ?Medication Sig Dispense Refill  ? Cholecalciferol (VITAMIN D3)  50 MCG (2000 UT) CAPS Take 1 capsule by mouth daily.    ? conjugated estrogens (PREMARIN) vaginal cream Apply 0.'5mg'$  (pea-sized amount)  just inside the vaginal introitus with a finger-tip on  Monday, Wednesday and Friday nights. 30 g 12  ? Cranberry 1000 MG CAPS Take 2 capsules by mouth daily.    ? losartan (COZAAR) 25 MG  tablet TAKE 1 TABLET(25 MG) BY MOUTH DAILY 90 tablet 3  ? Multiple Vitamins-Minerals (ICAPS AREDS 2 PO) Take by mouth.    ? Multiple Vitamins-Minerals (WOMENS MULTIVITAMIN PO) Take 1 tablet by mouth.    ? ?Current Facility-Administered Medications  ?Medication Dose Route Frequency Provider Last Rate Last Admin  ? betamethasone acetate-betamethasone sodium phosphate (CELESTONE) injection 3 mg  3 mg Intramuscular Once Daylene Katayama M, DPM      ? betamethasone acetate-betamethasone sodium phosphate (CELESTONE) injection 3 mg  3 mg Intramuscular Once Edrick Kins, DPM      ? ? ? ?Allergies:   Lisinopril, Meloxicam, and Nickel  ? ?Social History:  The patient  reports that she has never smoked. She has never used smokeless tobacco. She reports current alcohol use of about 4.0 standard drinks per week. She reports that she does not use drugs.  ? ?Family History:   family history includes Appendicitis in her father; Breast cancer in her cousin; Cancer (age of onset: 4) in her brother; Early death in her father and mother; Tuberculosis in her mother.  ? ? ?Review of Systems: ?Review of Systems  ?Constitutional: Negative.   ?HENT: Negative.    ?Respiratory: Negative.    ?Cardiovascular: Negative.   ?Gastrointestinal: Negative.   ?Musculoskeletal: Negative.   ?Neurological: Negative.   ?Psychiatric/Behavioral: Negative.    ?All other systems reviewed and are negative. ? ?PHYSICAL EXAM: ?VS:  BP 132/80 (BP Location: Left Arm, Patient Position: Sitting, Cuff Size: Normal)   Pulse 70   Ht '5\' 5"'$  (1.651 m)   Wt 159 lb 8 oz (72.3 kg)   SpO2 98%   BMI 26.54 kg/m?  , BMI Body mass index is 26.54 kg/m?Marland Kitchen ?Constitutional:  oriented to person, place, and time. No distress.  ?HENT:  ?Head: Grossly normal ?Eyes:  no discharge. No scleral icterus.  ?Neck: No JVD, no carotid bruits  ?Cardiovascular: Regular rate and rhythm, no murmurs appreciated ?Pulmonary/Chest: Clear to auscultation bilaterally, no wheezes or rails ?Abdominal:  Soft.  no distension.  no tenderness.  ?Musculoskeletal: Normal range of motion ?Neurological:  normal muscle tone. Coordination normal. No atrophy ?Skin: Skin warm and dry ?Psychiatric: normal affect, pleasant ? ?Recent Labs: ?08/17/2021: ALT 19; BUN 17; Creatinine, Ser 0.74; Potassium 4.0; Sodium 138  ? ? ?Lipid Panel ?Lab Results  ?Component Value Date  ? CHOL 232 (H) 08/17/2021  ? HDL 61.50 08/17/2021  ? LDLCALC 153 (H) 08/17/2021  ? TRIG 87.0 08/17/2021  ? ?  ? ?Wt Readings from Last 3 Encounters:  ?02/20/22 159 lb 8 oz (72.3 kg)  ?01/08/22 158 lb (71.7 kg)  ?08/22/21 158 lb 6.4 oz (71.8 kg)  ?  ? ? ?ASSESSMENT AND PLAN: ? ?Deep venous thrombosis of femoral vein with thrombophlebitis, unspecified laterality ([place]) ?stable, no recurrence ? ?Aortic atherosclerosis ([place]) ?Mild to moderate plaque noted on CT scan in the aortic arch ?Images reviewed  ?Does not want to be on a statin, statin myalgias ?Did not tolerate Zetia ?Stressed the need for more aggressive lipid management, will try Repatha ? ?Mixed hyperlipidemia ?Cholesterol elevated 220 up to 230 ?Recommend she try Repatha,  goal LDL less than 70 ? ?Dilated aortic root ([place]) ?Discussed with her in detail, images reviewed CT scan, echocardiograms ?Stable aortic root size and ascending aorta ?Aorta stable size on echo ? ?Congestive dilated cardiomyopathy ([place])  ?Ejection fraction 45 to 50% in 2015 ?Normalized in 2016 ?Euvolemic on recent echo ? ? ? Total encounter time more than 40 minutes ? Greater than 50% was spent in counseling and coordination of care with the patient ? ? ? ?No orders of the defined types were placed in this encounter. ? ? ? ?Signed, ?Esmond Plants, M.D., Ph.D. ?02/20/2022  ?[place] ?[phone] ? ?

## 2022-02-20 ENCOUNTER — Encounter: Payer: Self-pay | Admitting: Cardiovascular Disease

## 2022-02-20 ENCOUNTER — Telehealth: Payer: Self-pay

## 2022-02-20 ENCOUNTER — Ambulatory Visit: Payer: PPO | Admitting: Cardiovascular Disease

## 2022-02-20 VITALS — BP 132/80 | HR 70 | Ht 65.0 in | Wt 159.5 lb

## 2022-02-20 DIAGNOSIS — I340 Nonrheumatic mitral (valve) insufficiency: Secondary | ICD-10-CM

## 2022-02-20 DIAGNOSIS — T466X5A Adverse effect of antihyperlipidemic and antiarteriosclerotic drugs, initial encounter: Secondary | ICD-10-CM

## 2022-02-20 DIAGNOSIS — I77810 Thoracic aortic ectasia: Secondary | ICD-10-CM | POA: Diagnosis not present

## 2022-02-20 DIAGNOSIS — I700 Atherosclerosis of aorta: Secondary | ICD-10-CM | POA: Diagnosis not present

## 2022-02-20 DIAGNOSIS — M791 Myalgia, unspecified site: Secondary | ICD-10-CM | POA: Diagnosis not present

## 2022-02-20 DIAGNOSIS — E782 Mixed hyperlipidemia: Secondary | ICD-10-CM

## 2022-02-20 MED ORDER — REPATHA SURECLICK 140 MG/ML ~~LOC~~ SOAJ: 140.0000 mg | SUBCUTANEOUS | 3 refills | Status: DC

## 2022-02-20 NOTE — Patient Instructions (Addendum)
Medication Instructions:  ?Repatha 140 sq every 2 weeks, ? ?If you need a refill on your cardiac medications before your next appointment, please call your pharmacy.  ? ?Lab work: ?Your physician recommends that you return for a FASTING lipid profile: In 3 months. ? ?- You will need to be fasting. Please do not have anything to eat or drink after midnight the morning you have the lab work. You may only have water or black coffee with no cream or sugar.  ? ?[facility] Entrance at [facility] ?1st desk on the right to check in, past the screening table ?Lab hours: Monday- Friday (7:30 am- 5:30 pm)  ? ?Testing/Procedures: ?No new testing needed ? ?Follow-Up: ?At [facility], you and your health needs are our priority.  As part of our continuing mission to provide you with exceptional heart care, we have created designated Provider Care Teams.  These Care Teams include your primary Cardiologist (physician) and Advanced Practice Providers (APPs -  Physician Assistants and Nurse Practitioners) who all work together to provide you with the care you need, when you need it. ? ?You will need a follow up appointment in 12 months ? ?Providers on your designated Care Team:   ?Murray Hodgkins, NP ?Christell Faith, PA-C ?Cadence Kathlen Mody, PA-C ? ?COVID-19 Vaccine Information can be found at: [email or web address] For questions related to vaccine distribution or appointments, please email [email or web address] or call [phone].  ? ?

## 2022-02-20 NOTE — Telephone Encounter (Signed)
Prior Authorization for Repatha initiated in [email or web address]. KEY: [id number]  ? ?RESPONSE: Your request has been approved ?[id number] Repatha SureClick '140MG'$ /ML [place] SOAJ Quantity:2 ? ? ?

## 2022-05-01 ENCOUNTER — Telehealth: Payer: Self-pay

## 2022-05-01 NOTE — Telephone Encounter (Signed)
LM with patient's spouse to have her call back office to schedule follow-up appointment.

## 2022-06-11 ENCOUNTER — Other Ambulatory Visit: Payer: Self-pay | Admitting: Internal Medicine

## 2022-06-11 MED ORDER — LEVOFLOXACIN 500 MG PO TABS: 500.0000 mg | ORAL_TABLET | Freq: Every day | ORAL | 0 refills | Status: AC

## 2022-06-12 ENCOUNTER — Telehealth: Payer: Self-pay | Admitting: Internal Medicine

## 2022-06-12 NOTE — Telephone Encounter (Signed)
Patient wanted to know if she could get a flu shot with an infected tooth. She is not having her tooth fixed until Friday and she leaves Sept 9th on a trip.

## 2022-06-13 NOTE — Telephone Encounter (Signed)
Pt is aware.  

## 2022-08-27 ENCOUNTER — Ambulatory Visit (INDEPENDENT_AMBULATORY_CARE_PROVIDER_SITE_OTHER): Payer: PPO | Admitting: Internal Medicine

## 2022-08-27 ENCOUNTER — Encounter: Payer: Self-pay | Admitting: Internal Medicine

## 2022-08-27 VITALS — BP 128/78 | HR 101 | Temp 98.6°F | Ht 65.0 in | Wt 157.6 lb

## 2022-08-27 DIAGNOSIS — Z800 Family history of malignant neoplasm of digestive organs: Secondary | ICD-10-CM | POA: Diagnosis not present

## 2022-08-27 DIAGNOSIS — J432 Centrilobular emphysema: Secondary | ICD-10-CM | POA: Diagnosis not present

## 2022-08-27 DIAGNOSIS — Z1231 Encounter for screening mammogram for malignant neoplasm of breast: Secondary | ICD-10-CM

## 2022-08-27 DIAGNOSIS — I700 Atherosclerosis of aorta: Secondary | ICD-10-CM | POA: Diagnosis not present

## 2022-08-27 DIAGNOSIS — Z8639 Personal history of other endocrine, nutritional and metabolic disease: Secondary | ICD-10-CM

## 2022-08-27 DIAGNOSIS — I77810 Thoracic aortic ectasia: Secondary | ICD-10-CM

## 2022-08-27 DIAGNOSIS — L570 Actinic keratosis: Secondary | ICD-10-CM

## 2022-08-27 DIAGNOSIS — E559 Vitamin D deficiency, unspecified: Secondary | ICD-10-CM | POA: Diagnosis not present

## 2022-08-27 DIAGNOSIS — I10 Essential (primary) hypertension: Secondary | ICD-10-CM | POA: Diagnosis not present

## 2022-08-27 DIAGNOSIS — E782 Mixed hyperlipidemia: Secondary | ICD-10-CM | POA: Diagnosis not present

## 2022-08-27 DIAGNOSIS — Z0000 Encounter for general adult medical examination without abnormal findings: Secondary | ICD-10-CM | POA: Diagnosis not present

## 2022-08-27 LAB — LIPID PANEL
Cholesterol: 233 mg/dL — ABNORMAL HIGH (ref 0–200)
HDL: 56.2 mg/dL (ref >39.00)
LDL Cholesterol: 154 mg/dL — ABNORMAL HIGH (ref 0–99)
NonHDL: 176.66
Total CHOL/HDL Ratio: 4
Triglycerides: 115 mg/dL (ref 0.0–149.0)
VLDL: 23 mg/dL (ref 0.0–40.0)

## 2022-08-27 LAB — COMPREHENSIVE METABOLIC PANEL
ALT: 19 U/L (ref 0–35)
AST: 20 U/L (ref 0–37)
Albumin: 4.4 g/dL (ref 3.5–5.2)
Alkaline Phosphatase: 71 U/L (ref 39–117)
BUN: 14 mg/dL (ref 6–23)
CO2: 29 mEq/L (ref 19–32)
Calcium: 9.5 mg/dL (ref 8.4–10.5)
Chloride: 102 mEq/L (ref 96–112)
Creatinine, Ser: 0.69 mg/dL (ref 0.40–1.20)
GFR: 84.65 mL/min (ref >60.00)
Glucose, Bld: 91 mg/dL (ref 70–99)
Potassium: 4.1 mEq/L (ref 3.5–5.1)
Sodium: 138 mEq/L (ref 135–145)
Total Bilirubin: 0.6 mg/dL (ref 0.2–1.2)
Total Protein: 7.4 g/dL (ref 6.0–8.3)

## 2022-08-27 LAB — VITAMIN D 25 HYDROXY (VIT D DEFICIENCY, FRACTURES): VITD: 32.81 ng/mL (ref 30.00–100.00)

## 2022-08-27 LAB — TSH: TSH: 0.81 u[IU]/mL (ref 0.35–5.50)

## 2022-08-27 MED ORDER — ZOSTER VAC RECOMB ADJUVANTED 50 MCG/0.5ML IM SUSR: 0.5000 mL | Freq: Once | INTRAMUSCULAR | 1 refills | Status: AC

## 2022-08-27 MED ORDER — TETANUS-DIPHTH-ACELL PERTUSSIS 5-2.5-18.5 LF-MCG/0.5 IM SUSY: 0.5000 mL | PREFILLED_SYRINGE | Freq: Once | INTRAMUSCULAR | 0 refills | Status: AC

## 2022-08-27 MED ORDER — RSVPREF3 VAC RECOMB ADJUVANTED 120 MCG/0.5ML IM SUSR: 0.5000 mL | Freq: Once | INTRAMUSCULAR | 0 refills | Status: AC

## 2022-08-27 NOTE — Progress Notes (Signed)
Patient ID: MICHALLA RINGER, female    DOB: 01-Aug-1946  Age: 76 y.o. MRN: [id number]  The patient is here for annual preventive s examination and management of other chronic and acute problems.   The risk factors are reflected in the social history.  The roster of all physicians providing medical care to patient - is listed in the Snapshot section of the chart.  Activities of daily living:  The patient is 100% independent in all ADLs: dressing, toileting, feeding as well as independent mobility  Home safety : The patient has smoke detectors in the home. They wear seatbelts.  There are no firearms at home. There is no violence in the home.   There is no risks for hepatitis, STDs or HIV. There is no   history of blood transfusion. They have no travel history to infectious disease endemic areas of the world.  The patient has seen their dentist in the last six month. They have seen their eye doctor in the last year. They admit to slight hearing difficulty with regard to whispered voices and some television programs.  They have deferred audiologic testing in the last year.  They do not  have excessive sun exposure. Discussed the need for sun protection: hats, long sleeves and use of sunscreen if there is significant sun exposure.   Diet: the importance of a healthy diet is discussed. They do have a healthy diet.  The benefits of regular aerobic exercise were discussed. She walks 4 times per week ,  20 minutes.   Depression screen: there are no signs or vegative symptoms of depression- irritability, change in appetite, anhedonia, sadness/tearfullness.  Cognitive assessment: the patient manages all their financial and personal affairs and is actively engaged. They could relate day,date,year and events; recalled 2/3 objects at 3 minutes; performed clock-face test normally.  The following portions of the patient's history were reviewed and updated as appropriate: allergies, current medications, past  family history, past medical history,  past surgical history, past social history  and problem list.  Visual acuity was not assessed per patient preference since she has regular follow up with her ophthalmologist. Hearing and body mass index were assessed and reviewed.   During the course of the visit the patient was educated and counseled about appropriate screening and preventive services including : fall prevention , diabetes screening, nutrition counseling, colorectal cancer screening, and recommended immunizations.    CC: The primary encounter diagnosis was Family history of colon cancer requiring screening colonoscopy. Diagnoses of Breast cancer screening by mammogram, Personal history of estrogen deficiency, Vitamin D deficiency, Mixed hyperlipidemia, Actinic keratosis, Aortic atherosclerosis ([place]), Centrilobular emphysema ([place]), Dilated aortic root ([place]), Encounter for preventive health examination, and White coat syndrome with hypertension were also pertinent to this visit.   1) chronic insomnia :  averages  5 hours most nights.  Current events aggravating ,  and full moon agitation.   2) IBS: has  increased flatulence when she eats vegetables .  Avoids vegetable when not eating at home.   3) taking repatha  since May upon the advice of Dr Rockey Situ   4) sees eye  once a year had an annual with skin doctor   History Kamorah has a past medical history of Aortic atherosclerosis ([place]), Arthritis, Dilated congestive cardiomyopathy ([place]), Diverticulitis, colon, GERD (gastroesophageal reflux disease), Hypertension, and TIA (transient ischemic attack).   She has a past surgical history that includes Cesarean section; colonoscopy with polypectomy; and Colonoscopy with propofol (N/A, 09/18/2017).   Her  family history includes Appendicitis in her father; Breast cancer in her cousin; Cancer (age of onset: 19) in her brother; Early death in her father and mother; Tuberculosis in her mother.She reports  that she has never smoked. She has never used smokeless tobacco. She reports current alcohol use of about 4.0 standard drinks of alcohol per week. She reports that she does not use drugs.  Outpatient Medications Prior to Visit  Medication Sig Dispense Refill   Cholecalciferol (VITAMIN D3) 50 MCG (2000 UT) CAPS Take 1 capsule by mouth daily.     conjugated estrogens (PREMARIN) vaginal cream Apply 0.'5mg'$  (pea-sized amount)  just inside the vaginal introitus with a finger-tip on  Monday, Wednesday and Friday nights. 30 g 12   Cranberry 1000 MG CAPS Take 2 capsules by mouth daily.     Evolocumab (REPATHA SURECLICK) 622 MG/ML SOAJ Inject 140 mg into the skin every 14 (fourteen) days. 2 mL 3   losartan (COZAAR) 25 MG tablet TAKE 1 TABLET(25 MG) BY MOUTH DAILY 90 tablet 3   Multiple Vitamins-Minerals (ICAPS AREDS 2 PO) Take by mouth.     Multiple Vitamins-Minerals (WOMENS MULTIVITAMIN PO) Take 1 tablet by mouth.     Facility-Administered Medications Prior to Visit  Medication Dose Route Frequency Provider Last Rate Last Admin   betamethasone acetate-betamethasone sodium phosphate (CELESTONE) injection 3 mg  3 mg Intramuscular Once Daylene Katayama M, DPM       betamethasone acetate-betamethasone sodium phosphate (CELESTONE) injection 3 mg  3 mg Intramuscular Once Edrick Kins, DPM       Health Maintenance reviewed :  Diagnostic mammogram/US of left breast April 2021 Colonoscopy 2018: 5 yr follow up rec due to history of polyps (none found in 2018) and FH of colon CA  DEXA done in 2015:  T Score -1.6 Aortic /LAD atherosclerosis, moderate:  advised to start Repatha by Dr Rockey Situ in May 2023 visit   Review of Systems  Patient denies headache, fevers, malaise, unintentional weight loss, skin rash, eye pain, sinus congestion and sinus pain, sore throat, dysphagia,  hemoptysis , cough, dyspnea, wheezing, chest pain, palpitations, orthopnea, edema, abdominal pain, nausea, melena, constipation, flank pain,  dysuria, hematuria, urinary  Frequency, nocturia, numbness, tingling, seizures,  Focal weakness, Loss of consciousness,  Tremor, insomnia, depression, anxiety, and suicidal ideation.     Objective:  BP 128/78 (BP Location: Left Arm, Patient Position: Sitting, Cuff Size: Normal)   Pulse (!) 101   Temp 98.6 F (37 C) (Oral)   Ht '5\' 5"'$  (1.651 m)   Wt 157 lb 9.6 oz (71.5 kg)   SpO2 98%   BMI 26.23 kg/m   Physical Exam   General appearance: alert, cooperative and appears stated age Head: Normocephalic, without obvious abnormality, atraumatic Eyes: conjunctivae/corneas clear. PERRL, EOM's intact. Fundi benign. Ears: normal TM's and external ear canals both ears Nose: Nares normal. Septum midline. Mucosa normal. No drainage or sinus tenderness. Throat: lips, mucosa, and tongue normal; teeth and gums normal Neck: no adenopathy, no carotid bruit, no JVD, supple, symmetrical, trachea midline and thyroid not enlarged, symmetric, no tenderness/mass/nodules Lungs: clear to auscultation bilaterally Breasts: normal appearance, no masses or tenderness Heart: regular rate and rhythm, S1, S2 normal, no murmur, click, rub or gallop Abdomen: soft, non-tender; bowel sounds normal; no masses,  no organomegaly Extremities: extremities normal, atraumatic, no cyanosis or edema Pulses: 2+ and symmetric Skin: Skin color, texture, turgor normal. No rashes or lesions Neurologic: Alert and oriented X 3, normal strength and tone. Normal symmetric  reflexes. Normal coordination and gait.    Assessment & Plan:   Problem List Items Addressed This Visit     Actinic keratosis    She has had a full body exam by dermatology in the last year with no worrisome lesions found       Aortic atherosclerosis (HCC)    Tolerating Repatha,  prescribed by Dr Rockey Situ  in May due to patient's refusal to take statin      Breast cancer screening by mammogram   Relevant Orders   MM 3D SCREEN BREAST BILATERAL   Centrilobular  emphysema ([place])    Recommending RSV vaccine       Dilated aortic root ([place])    Stable by Surveillance with annual ECHO done by Dr Rockey Situ       Encounter for preventive health examination    age appropriate education and counseling updated, referrals for preventative services and immunizations addressed, dietary and smoking counseling addressed, most recent labs reviewed.  I have personally reviewed and have noted:   1) the patient's medical and social history 2) The pt's use of alcohol, tobacco, and illicit drugs 3) The patient's current medications and supplements 4) Functional ability including ADL's, fall risk, home safety risk, hearing and visual impairment 5) Diet and physical activities 6) Evidence for depression or mood disorder 7) The patient's height, weight, and BMI have been recorded in the chart  I have made referrals, and provided counseling and education based on review of the above       Hyperlipidemia    She has declined statin therapy but has been using Repatha since May 2023 ([place])      Relevant Orders   Lipid panel   TSH   Comprehensive metabolic panel   White coat syndrome with hypertension    Well controlled on current regimen. Renal function  is due; no changes today.       Other Visit Diagnoses     Family history of colon cancer requiring screening colonoscopy    -  Primary   Relevant Orders   Ambulatory referral to Gastroenterology   Personal history of estrogen deficiency       Relevant Orders   DG Bone Density   Vitamin D deficiency       Relevant Orders   VITAMIN D 25 Hydroxy (Vit-D Deficiency, Fractures)       I am having Jennette Banker. Borrelli start on Tdap, Zoster Vaccine Adjuvanted, and RSV vaccine recomb adjuvanted. I am also having her maintain her Multiple Vitamins-Minerals (WOMENS MULTIVITAMIN PO), Cranberry, vitamin D3, Premarin, Multiple Vitamins-Minerals (ICAPS AREDS 2 PO), losartan, and Repatha SureClick. We will continue to  administer betamethasone acetate-betamethasone sodium phosphate and betamethasone acetate-betamethasone sodium phosphate.    I provided 40 minutes of  face-to-face time during this encounter reviewing patient's current problems and past surgeries,  recent labs and imaging studies, providing counseling on the above mentioned problems , and coordination  of care .   Follow-up: No follow-ups on file.   Crecencio Mc, MD

## 2022-08-27 NOTE — Assessment & Plan Note (Signed)
Recommending RSV vaccine

## 2022-08-27 NOTE — Assessment & Plan Note (Signed)
Well controlled on current regimen. Renal function  is due; no changes today.

## 2022-08-27 NOTE — Assessment & Plan Note (Addendum)
Stable by Surveillance with annual ECHO done by Dr Rockey Situ

## 2022-08-27 NOTE — Assessment & Plan Note (Signed)
She has had a full body exam by dermatology in the last year with no worrisome lesions found

## 2022-08-27 NOTE — Assessment & Plan Note (Signed)

## 2022-08-27 NOTE — Patient Instructions (Addendum)
1) It is perfectly safe to Take probiotics  daily Try using beano before any meal with any beans , cabbage,  Broccoli, cauliflower and brussel sprouts  You are being referred for colonoscopy to [facility].  YOU ARE DUE  2) Your annual mammogram is overdue,  and you need a DEXA  SCAN.  Both  have been ordered.  Please call [place] to call to make your appointments  .  The phone number for [facility] is  [phone]    3)  You might want to try using Relaxium for insomnia  (as seen on TV commercials) . It is available through [employer] and contains all natural supplements:  Melatonin 5 mg  Chamomile 25 mg Passionflower extract 75 mg GABA 100 mg Ashwaganda extract 125 mg Magnesium citrate, glycinate, oxide (100 mg)  L tryptophan 500 mg Valerest (proprietary  ingredient ; probably valeria root extract)    4) I recommend that you have  The TDaP vaccine and the Shingles vaccine.  I have sent prescriptions for these  to your local pharmacy

## 2022-08-27 NOTE — Assessment & Plan Note (Signed)
Tolerating Repatha,  prescribed by Dr Rockey Situ  in May due to patient's refusal to take statin

## 2022-08-27 NOTE — Assessment & Plan Note (Signed)
She has declined statin therapy but has been using Repatha since May 2023 [facility])

## 2022-09-11 ENCOUNTER — Telehealth: Payer: Self-pay

## 2022-09-11 NOTE — Telephone Encounter (Signed)
Spoke with pt and informed her of her lab results. Pt gave a verbal understanding.  

## 2022-09-11 NOTE — Telephone Encounter (Signed)
Patient states she would like to know the results of her cholesterol test.  Patient states she does not have MyChart, so she would like for [place] to call her.

## 2022-09-26 NOTE — Telephone Encounter (Signed)
Error

## 2022-11-01 ENCOUNTER — Other Ambulatory Visit: Payer: Self-pay | Admitting: Internal Medicine

## 2022-12-11 DIAGNOSIS — H353132 Nonexudative age-related macular degeneration, bilateral, intermediate dry stage: Secondary | ICD-10-CM | POA: Diagnosis not present

## 2022-12-11 DIAGNOSIS — H2513 Age-related nuclear cataract, bilateral: Secondary | ICD-10-CM | POA: Diagnosis not present

## 2022-12-11 DIAGNOSIS — H43813 Vitreous degeneration, bilateral: Secondary | ICD-10-CM | POA: Diagnosis not present

## 2022-12-25 DIAGNOSIS — Z86010 Personal history of colonic polyps: Secondary | ICD-10-CM | POA: Diagnosis not present

## 2023-01-14 ENCOUNTER — Telehealth: Payer: Self-pay | Admitting: Internal Medicine

## 2023-01-14 NOTE — Telephone Encounter (Signed)
Contacted [name] to schedule their annual wellness visit. Appointment made for 01/17/2023.  Thank you,  [place] Direct dial  [phone]

## 2023-01-17 ENCOUNTER — Ambulatory Visit (INDEPENDENT_AMBULATORY_CARE_PROVIDER_SITE_OTHER): Payer: PPO

## 2023-01-17 VITALS — Ht 65.0 in | Wt 157.0 lb

## 2023-01-17 DIAGNOSIS — Z0000 Encounter for general adult medical examination without abnormal findings: Secondary | ICD-10-CM

## 2023-01-17 DIAGNOSIS — Z1231 Encounter for screening mammogram for malignant neoplasm of breast: Secondary | ICD-10-CM

## 2023-01-17 NOTE — Patient Instructions (Addendum)
Sally Morgan , Thank you for taking time to come for your Medicare Wellness Visit. I appreciate your ongoing commitment to your health goals. Please review the following plan we discussed and let me know if I can assist you in the future.   These are the goals we discussed:  Goals      Follow up with Primary Care Provider     As needed        This is a list of the screening recommended for you and due dates:  Health Maintenance  Topic Date Due   DTaP/Tdap/Td vaccine (2 - Tdap) 02/03/2013   Mammogram  01/10/2021   COVID-19 Vaccine (3 - Pfizer risk series) 02/02/2023*   Zoster (Shingles) Vaccine (1 of 2) 04/18/2023*   Colon Cancer Screening  06/16/2023*   Flu Shot  05/16/2023   Medicare Annual Wellness Visit  01/17/2024   Pneumonia Vaccine  Completed   DEXA scan (bone density measurement)  Completed   Hepatitis C Screening: USPSTF Recommendation to screen - Ages 22-79 yo.  Completed   HPV Vaccine  Aged Out  *Topic was postponed. The date shown is not the original due date.    Advanced directives: End of life planning; Advance aging; Advanced directives discussed.  Copy of current HCPOA/Living Will requested.    Conditions/risks identified: none new  Next appointment: Follow up in one year for your annual wellness visit    Preventive Care 65 Years and Older, Female Preventive care refers to lifestyle choices and visits with your health care provider that can promote health and wellness. What does preventive care include? A yearly physical exam. This is also called an annual well check. Dental exams once or twice a year. Routine eye exams. Ask your health care provider how often you should have your eyes checked. Personal lifestyle choices, including: Daily care of your teeth and gums. Regular physical activity. Eating a healthy diet. Avoiding tobacco and drug use. Limiting alcohol use. Practicing safe sex. Taking low-dose aspirin every day. Taking vitamin and mineral  supplements as recommended by your health care provider. What happens during an annual well check? The services and screenings done by your health care provider during your annual well check will depend on your age, overall health, lifestyle risk factors, and family history of disease. Counseling  Your health care provider may ask you questions about your: Alcohol use. Tobacco use. Drug use. Emotional well-being. Home and relationship well-being. Sexual activity. Eating habits. History of falls. Memory and ability to understand (cognition). Work and work Statistician. Reproductive health. Screening  You may have the following tests or measurements: Height, weight, and BMI. Blood pressure. Lipid and cholesterol levels. These may be checked every 5 years, or more frequently if you are over 106 years old. Skin check. Lung cancer screening. You may have this screening every year starting at age 9 if you have a 30-pack-year history of smoking and currently smoke or have quit within the past 15 years. Fecal occult blood test (FOBT) of the stool. You may have this test every year starting at age 4. Flexible sigmoidoscopy or colonoscopy. You may have a sigmoidoscopy every 5 years or a colonoscopy every 10 years starting at age 27. Hepatitis C blood test. Hepatitis B blood test. Sexually transmitted disease (STD) testing. Diabetes screening. This is done by checking your blood sugar (glucose) after you have not eaten for a while (fasting). You may have this done every 1-3 years. Bone density scan. This is done to screen for  osteoporosis. You may have this done starting at age 58. Mammogram. This may be done every 1-2 years. Talk to your health care provider about how often you should have regular mammograms. Talk with your health care provider about your test results, treatment options, and if necessary, the need for more tests. Vaccines  Your health care provider may recommend certain  vaccines, such as: Influenza vaccine. This is recommended every year. Tetanus, diphtheria, and acellular pertussis (Tdap, Td) vaccine. You may need a Td booster every 10 years. Zoster vaccine. You may need this after age 69. Pneumococcal 13-valent conjugate (PCV13) vaccine. One dose is recommended after age 86. Pneumococcal polysaccharide (PPSV23) vaccine. One dose is recommended after age 77. Talk to your health care provider about which screenings and vaccines you need and how often you need them. This information is not intended to replace advice given to you by your health care provider. Make sure you discuss any questions you have with your health care provider. Document Released: 10/28/2015 Document Revised: 06/20/2016 Document Reviewed: 08/02/2015 Elsevier Interactive Patient Education  2017 [place] Prevention in the Home Falls can cause injuries. They can happen to people of all ages. There are many things you can do to make your home safe and to help prevent falls. What can I do on the outside of my home? Regularly fix the edges of walkways and driveways and fix any cracks. Remove anything that might make you trip as you walk through a door, such as a raised step or threshold. Trim any bushes or trees on the path to your home. Use bright outdoor lighting. Clear any walking paths of anything that might make someone trip, such as rocks or tools. Regularly check to see if handrails are loose or broken. Make sure that both sides of any steps have handrails. Any raised decks and porches should have guardrails on the edges. Have any leaves, snow, or ice cleared regularly. Use sand or salt on walking paths during winter. Clean up any spills in your garage right away. This includes oil or grease spills. What can I do in the bathroom? Use night lights. Install grab bars by the toilet and in the tub and shower. Do not use towel bars as grab bars. Use non-skid mats or decals in  the tub or shower. If you need to sit down in the shower, use a plastic, non-slip stool. Keep the floor dry. Clean up any water that spills on the floor as soon as it happens. Remove soap buildup in the tub or shower regularly. Attach bath mats securely with double-sided non-slip rug tape. Do not have throw rugs and other things on the floor that can make you trip. What can I do in the bedroom? Use night lights. Make sure that you have a light by your bed that is easy to reach. Do not use any sheets or blankets that are too big for your bed. They should not hang down onto the floor. Have a firm chair that has side arms. You can use this for support while you get dressed. Do not have throw rugs and other things on the floor that can make you trip. What can I do in the kitchen? Clean up any spills right away. Avoid walking on wet floors. Keep items that you use a lot in easy-to-reach places. If you need to reach something above you, use a strong step stool that has a grab bar. Keep electrical cords out of the way. Do not  use floor polish or wax that makes floors slippery. If you must use wax, use non-skid floor wax. Do not have throw rugs and other things on the floor that can make you trip. What can I do with my stairs? Do not leave any items on the stairs. Make sure that there are handrails on both sides of the stairs and use them. Fix handrails that are broken or loose. Make sure that handrails are as long as the stairways. Check any carpeting to make sure that it is firmly attached to the stairs. Fix any carpet that is loose or worn. Avoid having throw rugs at the top or bottom of the stairs. If you do have throw rugs, attach them to the floor with carpet tape. Make sure that you have a light switch at the top of the stairs and the bottom of the stairs. If you do not have them, ask someone to add them for you. What else can I do to help prevent falls? Wear shoes that: Do not have high  heels. Have rubber bottoms. Are comfortable and fit you well. Are closed at the toe. Do not wear sandals. If you use a stepladder: Make sure that it is fully opened. Do not climb a closed stepladder. Make sure that both sides of the stepladder are locked into place. Ask someone to hold it for you, if possible. Clearly mark and make sure that you can see: Any grab bars or handrails. First and last steps. Where the edge of each step is. Use tools that help you move around (mobility aids) if they are needed. These include: Canes. Walkers. Scooters. Crutches. Turn on the lights when you go into a dark area. Replace any light bulbs as soon as they burn out. Set up your furniture so you have a clear path. Avoid moving your furniture around. If any of your floors are uneven, fix them. If there are any pets around you, be aware of where they are. Review your medicines with your doctor. Some medicines can make you feel dizzy. This can increase your chance of falling. Ask your doctor what other things that you can do to help prevent falls. This information is not intended to replace advice given to you by your health care provider. Make sure you discuss any questions you have with your health care provider. Document Released: 07/28/2009 Document Revised: 03/08/2016 Document Reviewed: 11/05/2014 Elsevier Interactive Patient Education  2017 [employer].

## 2023-01-17 NOTE — Progress Notes (Signed)
Subjective:   Sally Morgan is a 77 y.o. female who presents for Medicare Annual (Subsequent) preventive examination.  Review of Systems    No ROS.  Medicare Wellness Virtual Visit.  Visual/audio telehealth visit, UTA vital signs.   See social history for additional risk factors.   Cardiac Risk Factors include: advanced age (>51men, >76 women)     Objective:    Today's Vitals   01/17/23 1425  Weight: 157 lb (71.2 kg)  Height: 5\' 5"  (1.651 m)   Body mass index is 26.13 kg/m.     01/17/2023    2:31 PM 01/08/2022   10:23 AM 06/06/2020    9:11 AM 06/04/2019    9:27 AM 04/09/2018   11:28 AM 09/18/2017    8:13 AM 04/08/2017   11:26 AM  Advanced Directives  Does Patient Have a Medical Advance Directive? Yes Yes Yes Yes Yes Yes Yes  Type of Paramedic of [place];Living will [place];Living will [place];Living will [place];Living will [place];Living will Living will Living will;Healthcare Power of Attorney  Does patient want to make changes to medical advance directive? No - Patient declined No - Patient declined No - Patient declined No - Patient declined No - Patient declined  No - Patient declined  Copy of [place] in Chart? No - copy requested No - copy requested No - copy requested No - copy requested No - copy requested  No - copy requested    Current Medications (verified) Outpatient Encounter Medications as of 01/17/2023  Medication Sig   Cholecalciferol (VITAMIN D3) 50 MCG (2000 UT) CAPS Take 1 capsule by mouth daily.   conjugated estrogens (PREMARIN) vaginal cream Apply 0.5mg  (pea-sized amount)  just inside the vaginal introitus with a finger-tip on  Monday, Wednesday and Friday nights.   Cranberry 1000 MG CAPS Take 2 capsules by mouth daily.   Evolocumab (REPATHA SURECLICK) [id number] MG/ML SOAJ Inject 140 mg into the skin every 14 (fourteen) days.    losartan (COZAAR) 25 MG tablet TAKE 1 TABLET(25 MG) BY MOUTH DAILY   Multiple Vitamins-Minerals (ICAPS AREDS 2 PO) Take by mouth.   Multiple Vitamins-Minerals (WOMENS MULTIVITAMIN PO) Take 1 tablet by mouth.   Facility-Administered Encounter Medications as of 01/17/2023  Medication   betamethasone acetate-betamethasone sodium phosphate (CELESTONE) injection 3 mg   betamethasone acetate-betamethasone sodium phosphate (CELESTONE) injection 3 mg    Allergies (verified) Lisinopril, Meloxicam, and Nickel   History: Past Medical History:  Diagnosis Date   Aortic atherosclerosis    Arthritis    Dilated congestive cardiomyopathy    Diverticulitis, colon    GERD (gastroesophageal reflux disease)    Hypertension    TIA (transient ischemic attack)    Past Surgical History:  Procedure Laterality Date   CESAREAN SECTION     x 2    colonoscopy with polypectomy     COLONOSCOPY WITH PROPOFOL N/A 09/18/2017   Procedure: COLONOSCOPY WITH PROPOFOL;  Surgeon: Manya Silvas, MD;  Location: [facility] ENDOSCOPY;  Service: Endoscopy;  Laterality: N/A;   Family History  Problem Relation Age of Onset   Cancer Brother 45       colon ca   Early death Mother    Tuberculosis Mother    Early death Father    Appendicitis Father    Breast cancer Cousin        paternal   Social History   Socioeconomic History   Marital  status: Widowed    Spouse name: Not on file   Number of children: Not on file   Years of education: Not on file   Highest education level: Not on file  Occupational History   Not on file  Tobacco Use   Smoking status: Never   Smokeless tobacco: Never  Vaping Use   Vaping Use: Never used  Substance and Sexual Activity   Alcohol use: Yes    Alcohol/week: 4.0 standard drinks of alcohol    Types: 1 Glasses of wine, 3 Standard drinks or equivalent per week    Comment: OCC   Drug use: No   Sexual activity: Not Currently  Other Topics Concern   Not on file  Social History  Narrative   Not on file   Social Determinants of Health   Financial Resource Strain: Low Risk  (01/17/2023)   Overall Financial Resource Strain (CARDIA)    Difficulty of Paying Living Expenses: Not hard at all  Food Insecurity: No Food Insecurity (01/17/2023)   Hunger Vital Sign    Worried About Running Out of Food in the Last Year: Never true    Ran Out of Food in the Last Year: Never true  Transportation Needs: No Transportation Needs (01/17/2023)   PRAPARE - Hydrologist (Medical): No    Lack of Transportation (Non-Medical): No  Physical Activity: Sufficiently Active (01/17/2023)   Exercise Vital Sign    Days of Exercise per Week: 5 days    Minutes of Exercise per Session: 30 min  Stress: No Stress Concern Present (01/17/2023)   [place]    Feeling of Stress : Not at all  Social Connections: Unknown (01/17/2023)   Social Connection and Isolation Panel [NHANES]    Frequency of Communication with Friends and Family: Not on file    Frequency of Social Gatherings with Friends and Family: Not on file    Attends Religious Services: Not on file    Active Member of Clubs or Organizations: Not on file    Attends Archivist Meetings: Not on file    Marital Status: Married    Tobacco Counseling Counseling given: Not Answered   Clinical Intake:  Pre-visit preparation completed: Yes        Diabetes: No  How often do you need to have someone help you when you read instructions, pamphlets, or other written materials from your doctor or pharmacy?: 1 - Never    Interpreter Needed?: No      Activities of Daily Living    01/17/2023    2:27 PM  In your present state of health, do you have any difficulty performing the following activities:  Hearing? 0  Vision? 0  Difficulty concentrating or making decisions? 0  Walking or climbing stairs? 0  Dressing or bathing? 0  Doing  errands, shopping? 0  Preparing Food and eating ? N  Using the Toilet? N  In the past six months, have you accidently leaked urine? N  Do you have problems with loss of bowel control? N  Managing your Medications? N  Managing your Finances? N  Housekeeping or managing your Housekeeping? N    Patient Care Team: Crecencio Mc, MD as PCP - General (Internal Medicine) Minna Merritts, MD as Consulting Physician (Cardiology)  Indicate any recent Medical Services you may have received from other than [facility] providers in the past year (date may be approximate).  Assessment:   This is a routine wellness examination for Sally Morgan.  I connected with  Sally Morgan on 01/17/23 by a audio enabled telemedicine application and verified that I am speaking with the correct person using two identifiers.  Patient Location: Home  Provider Location: Office/Clinic  I discussed the limitations of evaluation and management by telemedicine. The patient expressed understanding and agreed to proceed.   Hearing/Vision screen Hearing Screening - Comments:: Patient is able to hear conversational tones without difficulty.  No issues reported.    Vision Screening - Comments:: Wears corrective lenses when reading. Age related macular degeneration.    Dietary issues and exercise activities discussed: Current Exercise Habits: Home exercise routine, Type of exercise: walking, Time (Minutes): 60, Frequency (Times/Week): 5, Weekly Exercise (Minutes/Week): 300, Intensity: Mild   Goals Addressed   None    Depression Screen    01/17/2023    2:26 PM 08/27/2022   11:32 AM 01/08/2022   10:05 AM 08/22/2021    3:44 PM 06/21/2020    2:56 PM 06/06/2020    9:06 AM 06/04/2019    9:20 AM  PHQ 2/9 Scores  PHQ - 2 Score 0 0 0 0 2 0 0  PHQ- 9 Score  0   6      Fall Risk    01/17/2023    2:26 PM 08/27/2022   11:32 AM 01/08/2022   10:25 AM 08/22/2021    3:44 PM 07/29/2020    4:18 PM  Fall Risk   Falls in the  past year? 0 0 0 0 0  Number falls in past yr: 0 0 0    Injury with Fall? 0 0     Risk for fall due to :  No Fall Risks  No Fall Risks   Follow up Falls evaluation completed;Falls prevention discussed Falls evaluation completed Falls evaluation completed Falls evaluation completed Falls evaluation completed    FALL RISK PREVENTION PERTAINING TO THE HOME: Home free of loose throw rugs in walkways, pet beds, electrical cords, etc? Yes  Adequate lighting in your home to reduce risk of falls? Yes   ASSISTIVE DEVICES UTILIZED TO PREVENT FALLS: Life alert? No  Use of a cane, walker or w/c? No   TIMED UP AND GO: Was the test performed? No .    Cognitive Function:    04/09/2018   11:34 AM 04/08/2017   11:37 AM 10/12/2015   10:00 AM  MMSE - Mini Mental State Exam  Orientation to time 5 5 5   Orientation to Place 5 5 5   Registration 3 3 3   Attention/ Calculation 5 5 5   Recall 2 3 3   Language- name 2 objects 2 2 2   Language- repeat 1 1 1   Language- follow 3 step command 3 3 3   Language- read & follow direction 1 1 1   Write a sentence 1 1 1   Copy design 1 1 1   Total score 29 30 30         01/17/2023    2:32 PM 06/04/2019    9:26 AM  6CIT Screen  What Year? 0 points 0 points  What month? 0 points 0 points  What time? 0 points 0 points  Count back from 20 0 points 0 points  Months in reverse 0 points 0 points  Repeat phrase 0 points 0 points  Total Score 0 points 0 points    Immunizations Immunization History  Administered Date(s) Administered   Fluad Quad(high Dose 65+) 07/28/2019, 07/29/2020, 08/22/2021   Influenza  Whole 08/06/2010   Influenza-Unspecified 09/13/2018   PFIZER(Purple Top)SARS-COV-2 Vaccination 11/03/2019, 11/23/2019   Pneumococcal Conjugate-13 11/02/2013   Pneumococcal Polysaccharide-23 12/08/2015   Td 02/04/2003   TDAP status: Due, Education has been provided regarding the importance of this vaccine. Advised may receive this vaccine at local pharmacy or  Health Dept. Aware to provide a copy of the vaccination record if obtained from local pharmacy or Health Dept. Verbalized acceptance and understanding.  Covid-19 vaccine status: Completed vaccines  Shingrix Completed?: No.    Education has been provided regarding the importance of this vaccine. Patient has been advised to call insurance company to determine out of pocket expense if they have not yet received this vaccine. Advised may also receive vaccine at local pharmacy or Health Dept. Verbalized acceptance and understanding.  Screening Tests Health Maintenance  Topic Date Due   DTaP/Tdap/Td (2 - Tdap) 02/03/2013   MAMMOGRAM  01/10/2021   COVID-19 Vaccine (3 - Pfizer risk series) 02/02/2023 (Originally 12/21/2019)   Zoster Vaccines- Shingrix (1 of 2) 04/18/2023 (Originally 10/28/1964)   COLONOSCOPY (Pts 45-74yrs Insurance coverage will need to be confirmed)  06/16/2023 (Originally 09/18/2022)   INFLUENZA VACCINE  05/16/2023   Medicare Annual Wellness (AWV)  01/17/2024   Pneumonia Vaccine 42+ Years old  Completed   DEXA SCAN  Completed   Hepatitis C Screening  Completed   HPV VACCINES  Aged Out    Health Maintenance Health Maintenance Due  Topic Date Due   DTaP/Tdap/Td (2 - Tdap) 02/03/2013   MAMMOGRAM  01/10/2021   Mammogram- ordered per consent. Number provided for scheduling [phone].  Colonoscopy- deferred per patient preference.  Lung Cancer Screening: (Low Dose CT Chest recommended if Age 72-80 years, 30 pack-year currently smoking OR have quit w/in 15years.) does not qualify.   Hepatitis C Screening: Completed 11/2015.  Vision Screening: Recommended annual ophthalmology exams for early detection of glaucoma and other disorders of the eye.  Dental Screening: Recommended annual dental exams for proper oral hygiene  Community Resource Referral / Chronic Care Management: CRR required this visit?  No   CCM required this visit?  No      Plan:     I have personally  reviewed and noted the following in the patient's chart:   Medical and social history Use of alcohol, tobacco or illicit drugs  Current medications and supplements including opioid prescriptions. Patient is not currently taking opioid prescriptions. Functional ability and status Nutritional status Physical activity Advanced directives List of other physicians Hospitalizations, surgeries, and ER visits in previous 12 months Vitals Screenings to include cognitive, depression, and falls Referrals and appointments  In addition, I have reviewed and discussed with patient certain preventive protocols, quality metrics, and best practice recommendations. A written personalized care plan for preventive services as well as general preventive health recommendations were provided to patient.     Leta Jungling, LPN   [id number]

## 2023-01-25 ENCOUNTER — Other Ambulatory Visit: Payer: Self-pay | Admitting: Cardiovascular Disease

## 2023-03-07 ENCOUNTER — Ambulatory Visit
Admission: RE | Admit: 2023-03-07 | Discharge: 2023-03-07 | Disposition: A | Discharge status: Home / Self Care | Payer: PPO | Source: Ambulatory Visit | Attending: Family Medicine | Admitting: Family Medicine

## 2023-03-07 ENCOUNTER — Ambulatory Visit
Admission: RE | Admit: 2023-03-07 | Discharge: 2023-03-07 | Disposition: A | Discharge status: Home / Self Care | Payer: PPO | Source: Ambulatory Visit | Attending: Internal Medicine | Admitting: Internal Medicine

## 2023-03-07 DIAGNOSIS — Z1231 Encounter for screening mammogram for malignant neoplasm of breast: Secondary | ICD-10-CM

## 2023-03-07 DIAGNOSIS — Z13820 Encounter for screening for osteoporosis: Secondary | ICD-10-CM | POA: Insufficient documentation

## 2023-03-07 DIAGNOSIS — M85852 Other specified disorders of bone density and structure, left thigh: Secondary | ICD-10-CM | POA: Insufficient documentation

## 2023-03-07 DIAGNOSIS — Z8639 Personal history of other endocrine, nutritional and metabolic disease: Secondary | ICD-10-CM | POA: Diagnosis not present

## 2023-03-07 DIAGNOSIS — Z780 Asymptomatic menopausal state: Secondary | ICD-10-CM | POA: Insufficient documentation

## 2023-03-08 ENCOUNTER — Encounter: Payer: Self-pay | Admitting: Family Medicine

## 2023-03-14 ENCOUNTER — Telehealth: Payer: Self-pay | Admitting: Internal Medicine

## 2023-03-14 NOTE — Telephone Encounter (Signed)
See result note.  

## 2023-03-14 NOTE — Telephone Encounter (Signed)
Pt returning a call from the cma about her lab results

## 2023-03-21 ENCOUNTER — Ambulatory Visit (INDEPENDENT_AMBULATORY_CARE_PROVIDER_SITE_OTHER): Payer: PPO | Admitting: Nurse Practitioner

## 2023-03-21 ENCOUNTER — Encounter: Payer: Self-pay | Admitting: Nurse Practitioner

## 2023-03-21 VITALS — BP 118/82 | HR 89 | Temp 97.9°F | Ht 65.0 in | Wt 157.0 lb

## 2023-03-21 DIAGNOSIS — R058 Other specified cough: Secondary | ICD-10-CM

## 2023-03-21 DIAGNOSIS — R059 Cough, unspecified: Secondary | ICD-10-CM | POA: Insufficient documentation

## 2023-03-21 HISTORY — DX: Cough, unspecified: R05.9

## 2023-03-21 MED ORDER — HYDROCODONE BIT-HOMATROP MBR 5-1.5 MG/5ML PO SOLN: 5.0000 mL | Freq: Every evening | ORAL | 0 refills | Status: DC | PRN

## 2023-03-21 MED ORDER — AMOXICILLIN 500 MG PO TABS: 500.0000 mg | ORAL_TABLET | Freq: Two times a day (BID) | ORAL | 0 refills | Status: AC

## 2023-03-21 NOTE — Patient Instructions (Addendum)
Rx sent to the pharmacy. If Hycodan not covered by the insurance you may take OTC Mucinex for cough.

## 2023-03-21 NOTE — Assessment & Plan Note (Signed)
Will treat with amoxicillin and Hycodan. Advised patient to continue warm salt water gargles and honey. If symptoms not improving call the office further evaluation

## 2023-03-21 NOTE — Progress Notes (Signed)
Established Patient Office Visit  Subjective:  Patient ID: Sally Morgan, female    DOB: [birth year]  Age: 77 y.o. MRN: [id number]  CC:  Chief Complaint  Patient presents with   Cough    Started I week ago Mon    HPI  Sally Morgan presents for cough.   Cough This is a new problem. The current episode started in the past 7 days. The problem has been gradually worsening. The cough is Productive of sputum. Associated symptoms include postnasal drip, rhinorrhea and a sore throat. Pertinent negatives include no chest pain, chills or fever. Associated symptoms comments: Not letting her sleep at night.. The symptoms are aggravated by lying down. She has tried OTC cough suppressant (Salt water gargles, honey) for the symptoms. The treatment provided no relief. There is no history of asthma, bronchitis or environmental allergies.     Past Medical History:  Diagnosis Date   Aortic atherosclerosis (HCC)    Arthritis    Dilated congestive cardiomyopathy (HCC)    Diverticulitis, colon    GERD (gastroesophageal reflux disease)    Hypertension    TIA (transient ischemic attack)     Past Surgical History:  Procedure Laterality Date   CESAREAN SECTION     x 2    colonoscopy with polypectomy     COLONOSCOPY WITH PROPOFOL N/A 09/18/2017   Procedure: COLONOSCOPY WITH PROPOFOL;  Surgeon: Scot Jun, MD;  Location: [facility] ENDOSCOPY;  Service: Endoscopy;  Laterality: N/A;    Family History  Problem Relation Age of Onset   Cancer Brother 18       colon ca   Early death Mother    Tuberculosis Mother    Early death Father    Appendicitis Father    Breast cancer Cousin        paternal    Social History   Socioeconomic History   Marital status: Widowed    Spouse name: Not on file   Number of children: Not on file   Years of education: Not on file   Highest education level: Not on file  Occupational History   Not on file  Tobacco Use   Smoking status: Never   Smokeless  tobacco: Never  Vaping Use   Vaping Use: Never used  Substance and Sexual Activity   Alcohol use: Yes    Alcohol/week: 4.0 standard drinks of alcohol    Types: 1 Glasses of wine, 3 Standard drinks or equivalent per week    Comment: OCC   Drug use: No   Sexual activity: Not Currently  Other Topics Concern   Not on file  Social History Narrative   Not on file   Social Determinants of Health   Financial Resource Strain: Low Risk  (01/17/2023)   Overall Financial Resource Strain (CARDIA)    Difficulty of Paying Living Expenses: Not hard at all  Food Insecurity: No Food Insecurity (01/17/2023)   Hunger Vital Sign    Worried About Running Out of Food in the Last Year: Never true    Ran Out of Food in the Last Year: Never true  Transportation Needs: No Transportation Needs (01/17/2023)   PRAPARE - Administrator, Civil Service (Medical): No    Lack of Transportation (Non-Medical): No  Physical Activity: Sufficiently Active (01/17/2023)   Exercise Vital Sign    Days of Exercise per Week: 5 days    Minutes of Exercise per Session: 30 min  Stress: No Stress Concern Present (01/17/2023)  [employer] of Occupational Health - Occupational Stress Questionnaire    Feeling of Stress : Not at all  Social Connections: Unknown (01/17/2023)   Social Connection and Isolation Panel [NHANES]    Frequency of Communication with Friends and Family: Not on file    Frequency of Social Gatherings with Friends and Family: Not on file    Attends Religious Services: Not on file    Active Member of Clubs or Organizations: Not on file    Attends Banker Meetings: Not on file    Marital Status: Married  Intimate Partner Violence: Not At Risk (01/17/2023)   Humiliation, Afraid, Rape, and Kick questionnaire    Fear of Current or Ex-Partner: No    Emotionally Abused: No    Physically Abused: No    Sexually Abused: No     Outpatient Medications Prior to Visit  Medication Sig Dispense  Refill   Cholecalciferol (VITAMIN D3) 50 MCG (2000 UT) CAPS Take 1 capsule by mouth daily.     conjugated estrogens (PREMARIN) vaginal cream Apply 0.5mg  (pea-sized amount)  just inside the vaginal introitus with a finger-tip on  Monday, Wednesday and Friday nights. 30 g 12   Cranberry 1000 MG CAPS Take 2 capsules by mouth daily.     Evolocumab (REPATHA SURECLICK) 140 MG/ML SOAJ Inject 140 mg into the skin every 14 (fourteen) days. 2 mL 3   losartan (COZAAR) 25 MG tablet TAKE 1 TABLET(25 MG) BY MOUTH DAILY 90 tablet 0   Multiple Vitamins-Minerals (ICAPS AREDS 2 PO) Take by mouth.     Multiple Vitamins-Minerals (WOMENS MULTIVITAMIN PO) Take 1 tablet by mouth.     Facility-Administered Medications Prior to Visit  Medication Dose Route Frequency Provider Last Rate Last Admin   betamethasone acetate-betamethasone sodium phosphate (CELESTONE) injection 3 mg  3 mg Intramuscular Once Gala Lewandowsky M, DPM       betamethasone acetate-betamethasone sodium phosphate (CELESTONE) injection 3 mg  3 mg Intramuscular Once Felecia Shelling, DPM        Allergies  Allergen Reactions   Lisinopril Cough    cough   Meloxicam Tinitus    Gastritis and hematuria   Nickel     ROS Review of Systems  Constitutional:  Negative for chills and fever.  HENT:  Positive for postnasal drip, rhinorrhea and sore throat.   Respiratory:  Positive for cough.   Cardiovascular:  Negative for chest pain.  Allergic/Immunologic: Negative for environmental allergies.      Objective:    Physical Exam Constitutional:      Appearance: Normal appearance.  HENT:     Head: Normocephalic.     Right Ear: Tympanic membrane normal.     Left Ear: Tympanic membrane normal.     Nose: Nose normal.     Mouth/Throat:     Mouth: Mucous membranes are moist.     Palate: No mass.     Pharynx: Posterior oropharyngeal erythema present.     Tonsils: No tonsillar exudate.  Cardiovascular:     Rate and Rhythm: Normal rate and regular  rhythm.     Pulses: Normal pulses.     Heart sounds: Normal heart sounds.  Pulmonary:     Effort: Pulmonary effort is normal.     Breath sounds: Normal breath sounds. No stridor. No wheezing.  Musculoskeletal:     Cervical back: Normal range of motion and neck supple.  Neurological:     General: No focal deficit present.     Mental Status: She  is alert and oriented to person, place, and time. Mental status is at baseline.  Psychiatric:        Mood and Affect: Mood normal.        Behavior: Behavior normal.        Thought Content: Thought content normal.        Judgment: Judgment normal.     BP 118/82   Pulse 89   Temp 97.9 F (36.6 C) (Oral)   Ht 5\' 5"  (1.651 m)   Wt 157 lb (71.2 kg)   SpO2 97%   BMI 26.13 kg/m  Wt Readings from Last 3 Encounters:  03/21/23 157 lb (71.2 kg)  01/17/23 157 lb (71.2 kg)  08/27/22 157 lb 9.6 oz (71.5 kg)     Health Maintenance  Topic Date Due   DTaP/Tdap/Td (2 - Tdap) 02/03/2013   COVID-19 Vaccine (3 - Pfizer risk series) 12/21/2019   Zoster Vaccines- Shingrix (1 of 2) 04/18/2023 (Originally 10/28/1965)   Colonoscopy  06/16/2023 (Originally 09/18/2022)   INFLUENZA VACCINE  05/16/2023   Medicare Annual Wellness (AWV)  01/17/2024   MAMMOGRAM  03/06/2024   Pneumonia Vaccine 51+ Years old  Completed   DEXA SCAN  Completed   Hepatitis C Screening  Completed   HPV VACCINES  Aged Out    There are no preventive care reminders to display for this patient.  Lab Results  Component Value Date   TSH 0.81 08/27/2022   Lab Results  Component Value Date   WBC 4.9 07/21/2020   HGB 12.4 07/21/2020   HCT 36.5 07/21/2020   MCV 93.6 07/21/2020   PLT 201.0 07/21/2020   Lab Results  Component Value Date   NA 138 08/27/2022   K 4.1 08/27/2022   CO2 29 08/27/2022   GLUCOSE 91 08/27/2022   BUN 14 08/27/2022   CREATININE 0.69 08/27/2022   BILITOT 0.6 08/27/2022   ALKPHOS 71 08/27/2022   AST 20 08/27/2022   ALT 19 08/27/2022   PROT 7.4  08/27/2022   ALBUMIN 4.4 08/27/2022   CALCIUM 9.5 08/27/2022   GFR 84.65 08/27/2022   Lab Results  Component Value Date   CHOL 233 (H) 08/27/2022   Lab Results  Component Value Date   HDL 56.20 08/27/2022   Lab Results  Component Value Date   LDLCALC 154 (H) 08/27/2022   Lab Results  Component Value Date   TRIG 115.0 08/27/2022   Lab Results  Component Value Date   CHOLHDL 4 08/27/2022   No results found for: "HGBA1C"    Assessment & Plan:  Other cough Assessment & Plan: Will treat with amoxicillin and Hycodan. Advised patient to continue warm salt water gargles and honey. If symptoms not improving call the office further evaluation   Other orders -     Amoxicillin; Take 1 tablet (500 mg total) by mouth 2 (two) times daily for 7 days.  Dispense: 14 tablet; Refill: 0 -     HYDROcodone Bit-Homatrop MBr; Take 5 mLs by mouth at bedtime as needed for cough.  Dispense: 30 mL; Refill: 0    Follow-up: Return if symptoms worsen or fail to improve.   Kara Dies, NP

## 2023-04-05 ENCOUNTER — Telehealth: Payer: Self-pay | Admitting: Internal Medicine

## 2023-04-05 ENCOUNTER — Other Ambulatory Visit: Payer: Self-pay | Admitting: Nurse Practitioner

## 2023-04-05 DIAGNOSIS — R051 Acute cough: Secondary | ICD-10-CM

## 2023-04-05 MED ORDER — DOXYCYCLINE HYCLATE 100 MG PO TABS: 100.0000 mg | ORAL_TABLET | Freq: Two times a day (BID) | ORAL | 0 refills | Status: DC

## 2023-04-05 NOTE — Telephone Encounter (Signed)
Phone call to pt.  She was seen by Kara Dies, NP on 03/21/23 for cough was prescribed Amoxicillin 500mg  BID and cough medicine.  Pt reports that she was feeling better while on antibiotic but 2 days after finishing the antibiotic she started to feel bad again.  She reports that she has not had a fever but productive cough.  She reports that sometimes its clear and other times it yellow.  Please advise.

## 2023-04-05 NOTE — Telephone Encounter (Signed)
Have sent doxycycline to the pharmacy. Keep [place] posted about the symptoms.

## 2023-04-05 NOTE — Telephone Encounter (Signed)
Please call the patient and ask more about the symptoms and what other medications she taking? Is her symptoms better then before?  Any SOB, chest pain, hd.

## 2023-04-05 NOTE — Telephone Encounter (Signed)
Have sent doxycycline to the pharmacy. Keep [place] posted about her symptoms.

## 2023-04-05 NOTE — Telephone Encounter (Signed)
Spoke with pt and she stated that she does not have any other symptoms other than the productive cough that came back after 2 days of completing the abx. Pt stated that she is taking OTC Robitussin DM for the cough. Pt is concerned that since it has moved into the chest she will develop pneumonia.

## 2023-04-05 NOTE — Telephone Encounter (Signed)
Pt called in stating that she was in office 03/21/23 for a cough,however, as per pt, she fell worst now, and experiencing chest congestion as the same time, and she wants to know if she can have some meds over at the pharmacy?? Or what she can do??

## 2023-04-08 ENCOUNTER — Encounter: Payer: Self-pay | Admitting: Family Medicine

## 2023-04-08 ENCOUNTER — Ambulatory Visit (INDEPENDENT_AMBULATORY_CARE_PROVIDER_SITE_OTHER): Payer: PPO | Admitting: Family Medicine

## 2023-04-08 VITALS — BP 132/86 | HR 84 | Temp 97.7°F | Ht 65.0 in | Wt 154.6 lb

## 2023-04-08 DIAGNOSIS — J40 Bronchitis, not specified as acute or chronic: Secondary | ICD-10-CM | POA: Diagnosis not present

## 2023-04-08 MED ORDER — BENZONATATE 200 MG PO CAPS
200.0000 mg | ORAL_CAPSULE | Freq: Two times a day (BID) | ORAL | 0 refills | Status: DC | PRN
Start: 2023-04-08 — End: 2023-04-15

## 2023-04-08 MED ORDER — HYDROCOD POLI-CHLORPHE POLI ER 10-8 MG/5ML PO SUER
5.0000 mL | Freq: Every evening | ORAL | 0 refills | Status: DC | PRN
Start: 2023-04-08 — End: 2023-04-15

## 2023-04-08 MED ORDER — DOXYCYCLINE HYCLATE 100 MG PO TABS
100.0000 mg | ORAL_TABLET | Freq: Two times a day (BID) | ORAL | 0 refills | Status: DC
Start: 2023-04-08 — End: 2023-04-15

## 2023-04-08 NOTE — Assessment & Plan Note (Addendum)
Patient symptoms are most likely related to bronchitis.  Lungs are clear making pneumonia less likely.  We will start her on doxycycline.  She was given Tussionex for cough at night.  Will also prescribe Tessalon for cough during the day.  She was advised to monitor for drowsiness with the Tussionex and if she is excessively drowsy the day after taking it she will discontinue this.  She will not drive if she is drowsy while taking Tussionex.  She was advised on the risk of diarrhea and skin sensitivity to the sun with the doxycycline.  I did discuss starting on prednisone as well given her emphysema history though she declines this at this time.  If not improving over the next 3 to 4 days she will let us know.  If she worsens she will get reevaluated.

## 2023-04-08 NOTE — Telephone Encounter (Signed)
LVM to call back to inform pt Sally Morgan was sent to pharmacy and see if she is still having any symptoms

## 2023-04-08 NOTE — Patient Instructions (Signed)
Nice to see you. We are going to treat you with doxycycline.  There is some risk of diarrhea with any antibiotic and with this antibiotic there is risk of skin sensitivity to the sun.  Please make sure you cover up if you go out of the sun while taking this antibiotic. You can use the Tussidex for cough at night.  If this makes you excessively drowsy please discontinue use of it and let us know. You can use the Tessalon for cough during the day.

## 2023-04-08 NOTE — Progress Notes (Signed)
Marikay Alar, MD Phone: [phone]  Sally Morgan is a 77 y.o. female who presents today for f/u.  Cough: Patient notes onset of symptoms around [facility].  She came into the office on 03/21/2023 and was started on amoxicillin and given Hycodan.  She notes while she was on the amoxicillin her symptoms did improve some though they recurred right afterwards.  She continues to have cough and feels congested in her chest.  She is coughing up yellow mucus.  She notes no fever or shortness of breath.  She did have sore throat and postnasal drip initially though those have resolved.  She did not do a COVID test.  She notes over-the-counter Robitussin has not been helpful.  The Hycodan cough syrup helped a little bit more but not significantly.  Patient does appear to have a history of emphysema though she reports no symptoms with this.  Social History   Tobacco Use  Smoking Status Never  Smokeless Tobacco Never    Current Outpatient Medications on File Prior to Visit  Medication Sig Dispense Refill   Cholecalciferol (VITAMIN D3) 50 MCG (2000 UT) CAPS Take 1 capsule by mouth daily.     conjugated estrogens (PREMARIN) vaginal cream Apply 0.5mg  (pea-sized amount)  just inside the vaginal introitus with a finger-tip on  Monday, Wednesday and Friday nights. 30 g 12   Cranberry 1000 MG CAPS Take 2 capsules by mouth daily.     Evolocumab (REPATHA SURECLICK) 140 MG/ML SOAJ Inject 140 mg into the skin every 14 (fourteen) days. 2 mL 3   losartan (COZAAR) 25 MG tablet TAKE 1 TABLET(25 MG) BY MOUTH DAILY 90 tablet 0   Multiple Vitamins-Minerals (ICAPS AREDS 2 PO) Take by mouth.     Multiple Vitamins-Minerals (WOMENS MULTIVITAMIN PO) Take 1 tablet by mouth.     Current Facility-Administered Medications on File Prior to Visit  Medication Dose Route Frequency Provider Last Rate Last Admin   betamethasone acetate-betamethasone sodium phosphate (CELESTONE) injection 3 mg  3 mg Intramuscular Once Gala Lewandowsky M, DPM       betamethasone acetate-betamethasone sodium phosphate (CELESTONE) injection 3 mg  3 mg Intramuscular Once Felecia Shelling, DPM         ROS see history of present illness  Objective  Physical Exam Vitals:   04/08/23 1015 04/08/23 1041  BP: (!) 138/90 132/86  Pulse: (!) 104 84  Temp: 97.7 F (36.5 C)   SpO2: 98%     BP Readings from Last 3 Encounters:  04/08/23 132/86  03/21/23 118/82  08/27/22 128/78   Wt Readings from Last 3 Encounters:  04/08/23 154 lb 9.6 oz (70.1 kg)  03/21/23 157 lb (71.2 kg)  01/17/23 157 lb (71.2 kg)    Physical Exam Constitutional:      General: She is not in acute distress.    Appearance: She is not diaphoretic.  Cardiovascular:     Rate and Rhythm: Normal rate and regular rhythm.     Heart sounds: Normal heart sounds.  Pulmonary:     Effort: Pulmonary effort is normal.     Breath sounds: Normal breath sounds.  Skin:    General: Skin is warm and dry.  Neurological:     Mental Status: She is alert.      Assessment/Plan: Please see individual problem list.  Bronchitis Assessment & Plan: Patient symptoms are most likely related to bronchitis.  Lungs are clear making pneumonia less likely.  We will start her on doxycycline.  She was  given Tussionex for cough at night.  Will also prescribe Tessalon for cough during the day.  She was advised to monitor for drowsiness with the Tussionex and if she is excessively drowsy the day after taking it she will discontinue this.  She will not drive if she is drowsy while taking Tussionex.  She was advised on the risk of diarrhea and skin sensitivity to the sun with the doxycycline.  I did discuss starting on prednisone as well given her emphysema history though she declines this at this time.  If not improving over the next 3 to 4 days she will let us know.  If she worsens she will get reevaluated.  Orders: -     Doxycycline Hyclate; Take 1 tablet (100 mg total) by mouth 2 (two) times  daily.  Dispense: 14 tablet; Refill: 0 -     Hydrocod Poli-Chlorphe Poli ER; Take 5 mLs by mouth at bedtime as needed for cough.  Dispense: 70 mL; Refill: 0 -     Benzonatate; Take 1 capsule (200 mg total) by mouth 2 (two) times daily as needed for cough.  Dispense: 20 capsule; Refill: 0    Return if symptoms worsen or fail to improve.   Marikay Alar, MD [facility] Primary Care [facility]

## 2023-04-11 ENCOUNTER — Other Ambulatory Visit: Payer: Self-pay | Admitting: Family Medicine

## 2023-04-11 ENCOUNTER — Other Ambulatory Visit: Payer: PPO

## 2023-04-11 ENCOUNTER — Telehealth: Payer: Self-pay | Admitting: Internal Medicine

## 2023-04-11 ENCOUNTER — Ambulatory Visit (INDEPENDENT_AMBULATORY_CARE_PROVIDER_SITE_OTHER): Payer: PPO

## 2023-04-11 DIAGNOSIS — J439 Emphysema, unspecified: Secondary | ICD-10-CM | POA: Diagnosis not present

## 2023-04-11 DIAGNOSIS — R059 Cough, unspecified: Secondary | ICD-10-CM | POA: Diagnosis not present

## 2023-04-11 DIAGNOSIS — R918 Other nonspecific abnormal finding of lung field: Secondary | ICD-10-CM | POA: Diagnosis not present

## 2023-04-11 DIAGNOSIS — R051 Acute cough: Secondary | ICD-10-CM | POA: Diagnosis not present

## 2023-04-11 MED ORDER — PREDNISONE 20 MG PO TABS
40.0000 mg | ORAL_TABLET | Freq: Every day | ORAL | 0 refills | Status: DC
Start: 2023-04-11 — End: 2023-08-19

## 2023-04-11 MED ORDER — MOXIFLOXACIN HCL 400 MG PO TABS
400.0000 mg | ORAL_TABLET | Freq: Every day | ORAL | 0 refills | Status: DC
Start: 2023-04-11 — End: 2023-04-15

## 2023-04-11 NOTE — Telephone Encounter (Signed)
pt called stating she is still not feeling better after medication given to her on monday

## 2023-04-11 NOTE — Telephone Encounter (Signed)
See result note.  

## 2023-04-11 NOTE — Telephone Encounter (Addendum)
Patient is agreeable and scheduled for a chest xray today at 3:15.Called Patient with xray result and Dr. Purvis Sheffield recommendations of changing to a different antibiotic and taking the Prednisone . Patient is agreeable to the plan.

## 2023-04-11 NOTE — Addendum Note (Signed)
Addended by: Birdie Sons Calib Wadhwa G on: 04/11/2023 11:21 AM   Modules accepted: Orders

## 2023-04-11 NOTE — Telephone Encounter (Signed)
Patient states she is on day 4 of the antibiotic and is not a lot better. The cough has improved during the day but not at night. The Patient is upset that she is still sick and tired of being sick.

## 2023-04-11 NOTE — Telephone Encounter (Signed)
Noted. Given lack of improvement I would like to get a chest xray to evaluate further. I have placed an order for this to be done in our office. Please make sure she can have this done today if possible. I would also like to start her on prednisone given that she has a history of emphysema seen on prior CT chest and the prednisone should help with inflammation that may be associated with an exacerbation of emphysema. I will go ahead and send in the prednisone. She needs to monitor for difficulty sleeping and agitation on the prednisone and if those occur in an excessive manner she should let us know. I would like to see the result of the chest xray prior to sending in a different antibiotic as the result could dictate what I send in for her.

## 2023-04-11 NOTE — Telephone Encounter (Signed)
Spoke with pt and she stated that she is coming in today to have a chest xray done.

## 2023-04-15 ENCOUNTER — Ambulatory Visit (INDEPENDENT_AMBULATORY_CARE_PROVIDER_SITE_OTHER): Payer: PPO | Admitting: Family Medicine

## 2023-04-15 ENCOUNTER — Encounter: Payer: Self-pay | Admitting: Family Medicine

## 2023-04-15 VITALS — BP 138/88 | HR 80 | Temp 97.8°F | Ht 65.0 in | Wt 155.2 lb

## 2023-04-15 DIAGNOSIS — J189 Pneumonia, unspecified organism: Secondary | ICD-10-CM | POA: Insufficient documentation

## 2023-04-15 HISTORY — DX: Pneumonia, unspecified organism: J18.9

## 2023-04-15 MED ORDER — HYDROCOD POLI-CHLORPHE POLI ER 10-8 MG/5ML PO SUER
5.0000 mL | Freq: Every evening | ORAL | 0 refills | Status: DC | PRN
Start: 2023-04-15 — End: 2023-08-19

## 2023-04-15 MED ORDER — MOXIFLOXACIN HCL 400 MG PO TABS: 400.0000 mg | ORAL_TABLET | Freq: Every day | ORAL | 0 refills | Status: DC

## 2023-04-15 NOTE — Assessment & Plan Note (Signed)
Patient has been improving.  Discussed extending her antibiotic course for total of 7 days.  An additional 2 days of moxifloxacin was sent to the pharmacy.  I will also refill her Tussionex.  If she has any worsening symptoms at any point she will let us know.  If she develops shortness of breath or cough productive of blood she will seek medical attention emergency room.  She will chest x-ray in 4 weeks to confirm resolution of the x-ray findings.  Discussed it may take 3 to 4 weeks for her cough to fully resolve.

## 2023-04-15 NOTE — Progress Notes (Signed)
Marikay Alar, MD Phone: [phone]  Sally Morgan is a 77 y.o. female who presents today for f/u.  Pneumonia: Patient presents for follow-up.  She was seen last week for bronchitis and subsequently did not improve on doxycycline.  Chest x-ray revealed likely pneumonia in the right upper lobe.  Patient was started on moxifloxacin for likely pneumonia.  She was also started on prednisone given her emphysema previously seen on imaging.  Today she reports has been improving.  She notes her cough at night continues to be an issue though her daytime cough has improved quite a bit.  She has occasional productive cough.  No shortness of breath or fevers.  The Tussionex has been helpful.  The Tessalon was not helpful.  Social History   Tobacco Use  Smoking Status Never  Smokeless Tobacco Never    Current Outpatient Medications on File Prior to Visit  Medication Sig Dispense Refill   Cholecalciferol (VITAMIN D3) 50 MCG (2000 UT) CAPS Take 1 capsule by mouth daily.     conjugated estrogens (PREMARIN) vaginal cream Apply 0.5mg  (pea-sized amount)  just inside the vaginal introitus with a finger-tip on  Monday, Wednesday and Friday nights. 30 g 12   Cranberry 1000 MG CAPS Take 2 capsules by mouth daily.     losartan (COZAAR) 25 MG tablet TAKE 1 TABLET(25 MG) BY MOUTH DAILY 90 tablet 0   Multiple Vitamins-Minerals (ICAPS AREDS 2 PO) Take by mouth.     Multiple Vitamins-Minerals (WOMENS MULTIVITAMIN PO) Take 1 tablet by mouth.     predniSONE (DELTASONE) 20 MG tablet Take 2 tablets (40 mg total) by mouth daily with breakfast. 10 tablet 0   Evolocumab (REPATHA SURECLICK) 140 MG/ML SOAJ Inject 140 mg into the skin every 14 (fourteen) days. (Patient not taking: Reported on 04/15/2023) 2 mL 3   Current Facility-Administered Medications on File Prior to Visit  Medication Dose Route Frequency Provider Last Rate Last Admin   betamethasone acetate-betamethasone sodium phosphate (CELESTONE) injection 3 mg   3 mg Intramuscular Once Gala Lewandowsky M, DPM       betamethasone acetate-betamethasone sodium phosphate (CELESTONE) injection 3 mg  3 mg Intramuscular Once Felecia Shelling, DPM         ROS see history of present illness  Objective  Physical Exam Vitals:   04/15/23 1341  BP: 138/88  Pulse: 80  Temp: 97.8 F (36.6 C)  SpO2: 98%    BP Readings from Last 3 Encounters:  04/15/23 138/88  04/08/23 132/86  03/21/23 118/82   Wt Readings from Last 3 Encounters:  04/15/23 155 lb 3.2 oz (70.4 kg)  04/08/23 154 lb 9.6 oz (70.1 kg)  03/21/23 157 lb (71.2 kg)    Physical Exam Constitutional:      General: She is not in acute distress.    Appearance: She is not diaphoretic.  Cardiovascular:     Rate and Rhythm: Normal rate and regular rhythm.     Heart sounds: Normal heart sounds.  Pulmonary:     Effort: Pulmonary effort is normal.     Breath sounds: Normal breath sounds.  Skin:    General: Skin is warm and dry.  Neurological:     Mental Status: She is alert.     Assessment/Plan: Please see individual problem list.  Pneumonia of right upper lobe due to infectious organism Assessment & Plan: Patient has been improving.  Discussed extending her antibiotic course for total of 7 days.  An additional 2 days of moxifloxacin was sent  to the pharmacy.  I will also refill her Tussionex.  If she has any worsening symptoms at any point she will let us know.  If she develops shortness of breath or cough productive of blood she will seek medical attention emergency room.  She will chest x-ray in 4 weeks to confirm resolution of the x-ray findings.  Discussed it may take 3 to 4 weeks for her cough to fully resolve.  Orders: -     DG Chest 2 View; Future -     Moxifloxacin HCl; Take 1 tablet (400 mg total) by mouth daily.  Dispense: 2 tablet; Refill: 0 -     Hydrocod Poli-Chlorphe Poli ER; Take 5 mLs by mouth at bedtime as needed for cough.  Dispense: 70 mL; Refill: 0     Return in about  4 weeks (around 05/13/2023) for CXR, also needs yearly follow-up with PCP in november.   Marikay Alar, MD [facility] Primary Care [facility]

## 2023-04-29 ENCOUNTER — Other Ambulatory Visit: Payer: Self-pay | Admitting: Cardiovascular Disease

## 2023-05-08 ENCOUNTER — Telehealth: Payer: Self-pay | Admitting: Internal Medicine

## 2023-05-08 DIAGNOSIS — F5105 Insomnia due to other mental disorder: Secondary | ICD-10-CM

## 2023-05-08 DIAGNOSIS — I10 Essential (primary) hypertension: Secondary | ICD-10-CM

## 2023-05-08 NOTE — Telephone Encounter (Signed)
Patient need lab orders.

## 2023-05-13 ENCOUNTER — Other Ambulatory Visit: Payer: PPO

## 2023-05-13 ENCOUNTER — Ambulatory Visit (INDEPENDENT_AMBULATORY_CARE_PROVIDER_SITE_OTHER): Payer: PPO

## 2023-05-13 DIAGNOSIS — J189 Pneumonia, unspecified organism: Secondary | ICD-10-CM

## 2023-05-13 DIAGNOSIS — I10 Essential (primary) hypertension: Secondary | ICD-10-CM

## 2023-05-13 DIAGNOSIS — R918 Other nonspecific abnormal finding of lung field: Secondary | ICD-10-CM | POA: Diagnosis not present

## 2023-06-11 DIAGNOSIS — H04123 Dry eye syndrome of bilateral lacrimal glands: Secondary | ICD-10-CM | POA: Diagnosis not present

## 2023-06-11 DIAGNOSIS — H2513 Age-related nuclear cataract, bilateral: Secondary | ICD-10-CM | POA: Diagnosis not present

## 2023-06-11 DIAGNOSIS — H353132 Nonexudative age-related macular degeneration, bilateral, intermediate dry stage: Secondary | ICD-10-CM | POA: Diagnosis not present

## 2023-07-15 ENCOUNTER — Other Ambulatory Visit: Payer: Self-pay

## 2023-07-18 ENCOUNTER — Telehealth: Payer: Self-pay

## 2023-07-18 NOTE — Patient Outreach (Signed)
  Care Coordination   07/18/2023 Name: KORRIE HOFBAUER MRN: [id number] DOB: 10-Mar-1946   Care Coordination Outreach Attempts:  Successful contact made with patient.  Patient declined care coordination services.  Follow Up Plan:  No further outreach attempts will be made at this time. We have been unable to contact the patient to offer or enroll patient in care coordination services  Encounter Outcome:  Patient Visit Completed   Care Coordination Interventions:  No, not indicated    George Ina [facility] Care Coordination [phone] direct line

## 2023-07-22 ENCOUNTER — Ambulatory Visit
Admission: RE | Admit: 2023-07-22 | Discharge: 2023-07-22 | Disposition: A | Discharge status: Home / Self Care | Payer: PPO | Attending: Gastroenterology | Admitting: Gastroenterology

## 2023-07-22 ENCOUNTER — Encounter: Payer: Self-pay | Admitting: *Deleted

## 2023-07-22 ENCOUNTER — Encounter
Admission: RE | Disposition: A | Discharge status: Home / Self Care | Payer: Self-pay | Source: Home / Self Care | Attending: Gastroenterology

## 2023-07-22 ENCOUNTER — Ambulatory Visit: Payer: PPO | Admitting: Anesthesiology

## 2023-07-22 DIAGNOSIS — Z800 Family history of malignant neoplasm of digestive organs: Secondary | ICD-10-CM | POA: Insufficient documentation

## 2023-07-22 DIAGNOSIS — I10 Essential (primary) hypertension: Secondary | ICD-10-CM | POA: Diagnosis not present

## 2023-07-22 DIAGNOSIS — D123 Benign neoplasm of transverse colon: Secondary | ICD-10-CM | POA: Insufficient documentation

## 2023-07-22 DIAGNOSIS — K635 Polyp of colon: Secondary | ICD-10-CM | POA: Diagnosis not present

## 2023-07-22 DIAGNOSIS — K5730 Diverticulosis of large intestine without perforation or abscess without bleeding: Secondary | ICD-10-CM | POA: Insufficient documentation

## 2023-07-22 DIAGNOSIS — K640 First degree hemorrhoids: Secondary | ICD-10-CM | POA: Insufficient documentation

## 2023-07-22 DIAGNOSIS — K219 Gastro-esophageal reflux disease without esophagitis: Secondary | ICD-10-CM | POA: Insufficient documentation

## 2023-07-22 DIAGNOSIS — Z1211 Encounter for screening for malignant neoplasm of colon: Secondary | ICD-10-CM | POA: Insufficient documentation

## 2023-07-22 DIAGNOSIS — J449 Chronic obstructive pulmonary disease, unspecified: Secondary | ICD-10-CM | POA: Diagnosis not present

## 2023-07-22 DIAGNOSIS — I420 Dilated cardiomyopathy: Secondary | ICD-10-CM | POA: Insufficient documentation

## 2023-07-22 DIAGNOSIS — Z860101 Personal history of adenomatous and serrated colon polyps: Secondary | ICD-10-CM | POA: Diagnosis not present

## 2023-07-22 DIAGNOSIS — K648 Other hemorrhoids: Secondary | ICD-10-CM | POA: Diagnosis not present

## 2023-07-22 DIAGNOSIS — D122 Benign neoplasm of ascending colon: Secondary | ICD-10-CM | POA: Insufficient documentation

## 2023-07-22 HISTORY — PX: COLONOSCOPY WITH PROPOFOL: SHX5780

## 2023-07-22 HISTORY — PX: POLYPECTOMY: SHX5525

## 2023-07-22 SURGERY — COLONOSCOPY WITH PROPOFOL
Anesthesia: General

## 2023-07-22 MED ORDER — PROPOFOL 10 MG/ML IV BOLUS
INTRAVENOUS | Status: DC | PRN
  Administered 2023-07-22: 100 mg via INTRAVENOUS

## 2023-07-22 MED ORDER — SODIUM CHLORIDE 0.9 % IV SOLN: INTRAVENOUS | Status: DC

## 2023-07-22 MED ORDER — PROPOFOL 1000 MG/100ML IV EMUL
INTRAVENOUS | Status: AC
  Filled 2023-07-22: qty 100

## 2023-07-22 MED ORDER — PROPOFOL 500 MG/50ML IV EMUL
INTRAVENOUS | Status: DC | PRN
  Administered 2023-07-22: 150 ug/kg/min via INTRAVENOUS

## 2023-07-22 NOTE — Transfer of Care (Signed)
Immediate Anesthesia Transfer of Care Note  Patient: Sally Morgan  Procedure(s) Performed: Procedure(s): COLONOSCOPY WITH PROPOFOL (N/A)  Patient Location: PACU and Endoscopy Unit  Anesthesia Type:General  Level of Consciousness: sedated  Airway & Oxygen Therapy: Patient Spontanous Breathing and Patient connected to nasal cannula oxygen  Post-op Assessment: Report given to RN and Post -op Vital signs reviewed and stable  Post vital signs: Reviewed and stable  Last Vitals:  Vitals:   07/22/23 1239 07/22/23 1425  BP: (!) 138/90 91/62  Pulse: 73 78  Resp: 18 16  Temp: (!) 36.1 C 36.7 C  SpO2: 100% 97%    Complications: No apparent anesthesia complications

## 2023-07-22 NOTE — Op Note (Signed)
[facility]enterology Patient Name: Sally Morgan Procedure Date: 07/22/2023 1:35 PM MRN: [id number] Account #: [id number] Date of Birth: [birth year] Admit Type: Outpatient Age: 77 Room: [facility] ENDO ROOM 3 Gender: Female Note Status: Finalized Instrument Name: Nelda Marseille [id number] Procedure:             Colonoscopy Indications:           Surveillance: Personal history of adenomatous polyps                         on last colonoscopy > 5 years ago Providers:             Eather Colas MD, MD Referring MD:          Duncan Dull, MD (Referring MD) Medicines:             Monitored Anesthesia Care Complications:         No immediate complications. Estimated blood loss:                         Minimal. Procedure:             Pre-Anesthesia Assessment:                        - Prior to the procedure, a History and Physical was                         performed, and patient medications and allergies were                         reviewed. The patient is competent. The risks and                         benefits of the procedure and the sedation options and                         risks were discussed with the patient. All questions                         were answered and informed consent was obtained.                         Patient identification and proposed procedure were                         verified by the physician, the nurse, the                         anesthesiologist, the anesthetist and the technician                         in the endoscopy suite. Mental Status Examination:                         alert and oriented. Airway Examination: normal                         oropharyngeal airway and neck mobility. Respiratory  Examination: clear to auscultation. CV Examination:                         normal. Prophylactic Antibiotics: The patient does not                         require prophylactic antibiotics. Prior                          Anticoagulants: The patient has taken no anticoagulant                         or antiplatelet agents. ASA Grade Assessment: III - A                         patient with severe systemic disease. After reviewing                         the risks and benefits, the patient was deemed in                         satisfactory condition to undergo the procedure. The                         anesthesia plan was to use monitored anesthesia care                         (MAC). Immediately prior to administration of                         medications, the patient was re-assessed for adequacy                         to receive sedatives. The heart rate, respiratory                         rate, oxygen saturations, blood pressure, adequacy of                         pulmonary ventilation, and response to care were                         monitored throughout the procedure. The physical                         status of the patient was re-assessed after the                         procedure.                        After obtaining informed consent, the colonoscope was                         passed under direct vision. Throughout the procedure,                         the patient's blood pressure, pulse, and oxygen  saturations were monitored continuously. The                         Colonoscope was introduced through the anus and                         advanced to the the cecum, identified by appendiceal                         orifice and ileocecal valve. The colonoscopy was                         performed without difficulty. The patient tolerated                         the procedure well. The quality of the bowel                         preparation was good. The ileocecal valve, appendiceal                         orifice, and rectum were photographed. Findings:      The perianal and digital rectal examinations were normal.      A 3 mm polyp was found in the ascending  colon. The polyp was sessile.       The polyp was removed with a cold snare. Resection and retrieval were       complete. Estimated blood loss was minimal.      Three sessile polyps were found in the transverse colon. The polyps were       3 to 4 mm in size. These polyps were removed with a cold snare.       Resection and retrieval were complete. Estimated blood loss was minimal.      Multiple small-mouthed diverticula were found in the sigmoid colon.      Internal hemorrhoids were found during retroflexion. The hemorrhoids       were Grade I (internal hemorrhoids that do not prolapse).      The exam was otherwise without abnormality on direct and retroflexion       views. Impression:            - One 3 mm polyp in the ascending colon, removed with                         a cold snare. Resected and retrieved.                        - Three 3 to 4 mm polyps in the transverse colon,                         removed with a cold snare. Resected and retrieved.                        - Diverticulosis in the sigmoid colon.                        - Internal hemorrhoids.                        -  The examination was otherwise normal on direct and                         retroflexion views. Recommendation:        - Discharge patient to home.                        - Resume previous diet.                        - Continue present medications.                        - Await pathology results.                        - Repeat colonoscopy in 3 years for surveillance.                        - Return to referring physician as previously                         scheduled. Procedure Code(s):     --- Professional ---                        [phone], Colonoscopy, flexible; with removal of                         tumor(s), polyp(s), or other lesion(s) by snare                         technique Diagnosis Code(s):     --- Professional ---                        Z86.010, Personal history of colonic polyps                         D12.2, Benign neoplasm of ascending colon                        D12.3, Benign neoplasm of transverse colon (hepatic                         flexure or splenic flexure)                        K64.0, First degree hemorrhoids                        K57.30, Diverticulosis of large intestine without                         perforation or abscess without bleeding CPT copyright 2022 American Medical Association. All rights reserved. The codes documented in this report are preliminary and upon coder review may  be revised to meet current compliance requirements. Eather Colas MD, MD 07/22/2023 2:28:55 PM Number of Addenda: 0 Note Initiated On: 07/22/2023 1:35 PM Scope Withdrawal Time: 0 hours 14 minutes 27 seconds  Total Procedure Duration: 0 hours 19 minutes 27 seconds  Estimated Blood Loss:  Estimated blood loss: none. Estimated blood loss was  minimal.      [facility]

## 2023-07-22 NOTE — Interval H&P Note (Signed)
History and Physical Interval Note:  07/22/2023 1:44 PM  Sally Morgan  has presented today for surgery, with the diagnosis of HX OF COLON POLYPS.  The various methods of treatment have been discussed with the patient and family. After consideration of risks, benefits and other options for treatment, the patient has consented to  Procedure(s): COLONOSCOPY WITH PROPOFOL (N/A) as a surgical intervention.  The patient's history has been reviewed, patient examined, no change in status, stable for surgery.  I have reviewed the patient's chart and labs.  Questions were answered to the patient's satisfaction.     Regis Bill  Ok to proceed with colonoscopy

## 2023-07-22 NOTE — H&P (Signed)
Outpatient short stay form Pre-procedure 07/22/2023  Regis Bill, MD  Primary Physician: Sherlene Shams, MD  Reason for visit:  Surveillance colonoscopy  History of present illness:    77 y/o lady with history of  hypertension and GERD here for surveillance colonoscopy. Last colonoscopy was 5 years ago and was reportedly normal. No significant abdominal surgeries. No blood thinners. Brother with colon cancer but he was diagnosed older than 34.     Current Facility-Administered Medications:    0.9 %  sodium chloride infusion, , Intravenous, Continuous, Ege Muckey, Rossie Muskrat, MD, Last Rate: 20 mL/hr at 07/22/23 1323, Continued from Pre-op at 07/22/23 1323  Facility-Administered Medications Prior to Admission  Medication Dose Route Frequency Provider Last Rate Last Admin   betamethasone acetate-betamethasone sodium phosphate (CELESTONE) injection 3 mg  3 mg Intramuscular Once Gala Lewandowsky M, DPM       betamethasone acetate-betamethasone sodium phosphate (CELESTONE) injection 3 mg  3 mg Intramuscular Once Felecia Shelling, DPM       Medications Prior to Admission  Medication Sig Dispense Refill Last Dose   Cholecalciferol (VITAMIN D3) 50 MCG (2000 UT) CAPS Take 1 capsule by mouth daily.   Past Week   conjugated estrogens (PREMARIN) vaginal cream Apply 0.5mg  (pea-sized amount)  just inside the vaginal introitus with a finger-tip on  Monday, Wednesday and Friday nights. 30 g 12 Past Month   losartan (COZAAR) 25 MG tablet TAKE 1 TABLET(25 MG) BY MOUTH DAILY 90 tablet 0 07/22/2023 at 0700   Multiple Vitamins-Minerals (ICAPS AREDS 2 PO) Take by mouth.   Past Month   Multiple Vitamins-Minerals (WOMENS MULTIVITAMIN PO) Take 1 tablet by mouth.   Past Month   chlorpheniramine-HYDROcodone (TUSSIONEX) 10-8 MG/5ML Take 5 mLs by mouth at bedtime as needed for cough. (Patient not taking: Reported on 07/22/2023) 70 mL 0 Completed Course   Cranberry 1000 MG CAPS Take 2 capsules by mouth daily. (Patient  not taking: Reported on 07/22/2023)   Not Taking   Evolocumab (REPATHA SURECLICK) 140 MG/ML SOAJ Inject 140 mg into the skin every 14 (fourteen) days. (Patient not taking: Reported on 04/15/2023) 2 mL 3    moxifloxacin (AVELOX) 400 MG tablet Take 1 tablet (400 mg total) by mouth daily. (Patient not taking: Reported on 07/22/2023) 2 tablet 0 Completed Course   predniSONE (DELTASONE) 20 MG tablet Take 2 tablets (40 mg total) by mouth daily with breakfast. (Patient not taking: Reported on 07/22/2023) 10 tablet 0 Completed Course     Allergies  Allergen Reactions   Nickel    Lisinopril Cough    cough   Meloxicam Tinitus    Gastritis and hematuria     Past Medical History:  Diagnosis Date   Aortic atherosclerosis (HCC)    Arthritis    Dilated congestive cardiomyopathy (HCC)    Diverticulitis, colon    GERD (gastroesophageal reflux disease)    Hypertension    TIA (transient ischemic attack)     Review of systems:  Otherwise negative.    Physical Exam  Gen: Alert, oriented. Appears stated age.  HEENT: PERRLA. Lungs: No respiratory distress CV: RRR Abd: soft, benign, no masses Ext: No edema    Planned procedures: Proceed with colonoscopy. The patient understands the nature of the planned procedure, indications, risks, alternatives and potential complications including but not limited to bleeding, infection, perforation, damage to internal organs and possible oversedation/side effects from anesthesia. The patient agrees and gives consent to proceed.  Please refer to procedure notes for findings, recommendations  and patient disposition/instructions.     Regis Bill, MD [facility]enterology

## 2023-07-22 NOTE — Anesthesia Preprocedure Evaluation (Signed)
Anesthesia Evaluation  Patient identified by MRN, date of birth, ID band Patient awake    Reviewed: Allergy & Precautions, H&P , NPO status , Patient's Chart, lab work & pertinent test results, reviewed documented beta blocker date and time   Airway Mallampati: II   Neck ROM: full    Dental  (+) Poor Dentition   Pulmonary pneumonia, resolved, COPD   Pulmonary exam normal        Cardiovascular Exercise Tolerance: Good hypertension, On Medications negative cardio ROS Normal cardiovascular exam Rhythm:regular Rate:Normal     Neuro/Psych  PSYCHIATRIC DISORDERS      TIA Neuromuscular disease    GI/Hepatic Neg liver ROS,GERD  Medicated,,  Endo/Other  negative endocrine ROS    Renal/GU negative Renal ROS  negative genitourinary   Musculoskeletal   Abdominal   Peds  Hematology negative hematology ROS (+)   Anesthesia Other Findings Past Medical History: No date: Aortic atherosclerosis (HCC) No date: Arthritis No date: Dilated congestive cardiomyopathy (HCC) No date: Diverticulitis, colon No date: GERD (gastroesophageal reflux disease) No date: Hypertension No date: TIA (transient ischemic attack) Past Surgical History: No date: CESAREAN SECTION     Comment:  x 2  No date: colonoscopy with polypectomy 09/18/2017: COLONOSCOPY WITH PROPOFOL; N/A     Comment:  Procedure: COLONOSCOPY WITH PROPOFOL;  Surgeon: Scot Jun, MD;  Location: [facility] ENDOSCOPY;  Service:               Endoscopy;  Laterality: N/A; BMI    Body Mass Index: 24.58 kg/m     Reproductive/Obstetrics negative OB ROS                             Anesthesia Physical Anesthesia Plan  ASA: 3  Anesthesia Plan: General   Post-op Pain Management:    Induction:   PONV Risk Score and Plan:   Airway Management Planned:   Additional Equipment:   Intra-op Plan:   Post-operative Plan:   Informed  Consent: I have reviewed the patients History and Physical, chart, labs and discussed the procedure including the risks, benefits and alternatives for the proposed anesthesia with the patient or authorized representative who has indicated his/her understanding and acceptance.     Dental Advisory Given  Plan Discussed with: CRNA  Anesthesia Plan Comments:        Anesthesia Quick Evaluation

## 2023-07-23 ENCOUNTER — Encounter: Payer: Self-pay | Admitting: Gastroenterology

## 2023-07-23 LAB — SURGICAL PATHOLOGY

## 2023-07-23 NOTE — Anesthesia Postprocedure Evaluation (Signed)
Anesthesia Post Note  Patient: Sally Morgan  Procedure(s) Performed: COLONOSCOPY WITH PROPOFOL  Patient location during evaluation: PACU Anesthesia Type: General Level of consciousness: awake and alert Pain management: pain level controlled Vital Signs Assessment: post-procedure vital signs reviewed and stable Respiratory status: spontaneous breathing, nonlabored ventilation, respiratory function stable and patient connected to nasal cannula oxygen Cardiovascular status: blood pressure returned to baseline and stable Postop Assessment: no apparent nausea or vomiting Anesthetic complications: no   No notable events documented.   Last Vitals:  Vitals:   07/22/23 1435 07/22/23 1445  BP: 93/68 110/68  Pulse: 72 65  Resp: 15 20  Temp: 36.7 C   SpO2: 97% 100%    Last Pain:  Vitals:   07/23/23 0730  TempSrc:   PainSc: 0-No pain                 Yevette Edwards

## 2023-08-05 ENCOUNTER — Other Ambulatory Visit: Payer: Self-pay | Admitting: Cardiovascular Disease

## 2023-08-05 NOTE — Telephone Encounter (Signed)
Please contact pt for future appointment. Pt overdue for follow up appointment. Pt needing refills.

## 2023-08-06 NOTE — Telephone Encounter (Signed)
Pt scheduled on 12/2

## 2023-08-19 ENCOUNTER — Encounter: Payer: Self-pay | Admitting: Internal Medicine

## 2023-08-19 ENCOUNTER — Ambulatory Visit (INDEPENDENT_AMBULATORY_CARE_PROVIDER_SITE_OTHER): Payer: PPO | Admitting: Internal Medicine

## 2023-08-19 VITALS — BP 132/76 | HR 78 | Ht 65.5 in | Wt 152.2 lb

## 2023-08-19 DIAGNOSIS — D126 Benign neoplasm of colon, unspecified: Secondary | ICD-10-CM

## 2023-08-19 DIAGNOSIS — Z23 Encounter for immunization: Secondary | ICD-10-CM

## 2023-08-19 DIAGNOSIS — J432 Centrilobular emphysema: Secondary | ICD-10-CM | POA: Diagnosis not present

## 2023-08-19 DIAGNOSIS — Z0000 Encounter for general adult medical examination without abnormal findings: Secondary | ICD-10-CM | POA: Diagnosis not present

## 2023-08-19 DIAGNOSIS — I10 Essential (primary) hypertension: Secondary | ICD-10-CM

## 2023-08-19 DIAGNOSIS — Z79899 Other long term (current) drug therapy: Secondary | ICD-10-CM | POA: Diagnosis not present

## 2023-08-19 DIAGNOSIS — E782 Mixed hyperlipidemia: Secondary | ICD-10-CM | POA: Diagnosis not present

## 2023-08-19 LAB — CBC WITH DIFFERENTIAL/PLATELET
Basophils Absolute: 0 10*3/uL (ref 0.0–0.1)
Basophils Relative: 0.3 % (ref 0.0–3.0)
Eosinophils Absolute: 0.1 10*3/uL (ref 0.0–0.7)
Eosinophils Relative: 1.1 % (ref 0.0–5.0)
HCT: 38.9 % (ref 36.0–46.0)
Hemoglobin: 12.7 g/dL (ref 12.0–15.0)
Lymphocytes Relative: 45.2 % (ref 12.0–46.0)
Lymphs Abs: 2.2 10*3/uL (ref 0.7–4.0)
MCHC: 32.6 g/dL (ref 30.0–36.0)
MCV: 94.8 fL (ref 78.0–100.0)
Monocytes Absolute: 0.5 10*3/uL (ref 0.1–1.0)
Monocytes Relative: 11 % (ref 3.0–12.0)
Neutro Abs: 2 10*3/uL (ref 1.4–7.7)
Neutrophils Relative %: 42.4 % — ABNORMAL LOW (ref 43.0–77.0)
Platelets: 223 10*3/uL (ref 150.0–400.0)
RBC: 4.11 Mil/uL (ref 3.87–5.11)
RDW: 13.1 % (ref 11.5–15.5)
WBC: 4.8 10*3/uL (ref 4.0–10.5)

## 2023-08-19 LAB — COMPREHENSIVE METABOLIC PANEL
ALT: 25 U/L (ref 0–35)
AST: 24 U/L (ref 0–37)
Albumin: 4.4 g/dL (ref 3.5–5.2)
Alkaline Phosphatase: 74 U/L (ref 39–117)
BUN: 15 mg/dL (ref 6–23)
CO2: 28 meq/L (ref 19–32)
Calcium: 9.6 mg/dL (ref 8.4–10.5)
Chloride: 102 meq/L (ref 96–112)
Creatinine, Ser: 0.66 mg/dL (ref 0.40–1.20)
GFR: 84.98 mL/min (ref >60.00)
Glucose, Bld: 90 mg/dL (ref 70–99)
Potassium: 3.9 meq/L (ref 3.5–5.1)
Sodium: 137 meq/L (ref 135–145)
Total Bilirubin: 0.6 mg/dL (ref 0.2–1.2)
Total Protein: 7.8 g/dL (ref 6.0–8.3)

## 2023-08-19 LAB — LIPID PANEL
Cholesterol: 233 mg/dL — ABNORMAL HIGH (ref 0–200)
HDL: 65.6 mg/dL (ref >39.00)
LDL Cholesterol: 153 mg/dL — ABNORMAL HIGH (ref 0–99)
NonHDL: 167.84
Total CHOL/HDL Ratio: 4
Triglycerides: 75 mg/dL (ref 0.0–149.0)
VLDL: 15 mg/dL (ref 0.0–40.0)

## 2023-08-19 LAB — HEMOGLOBIN A1C: Hgb A1c MFr Bld: 5.9 % (ref 4.6–6.5)

## 2023-08-19 LAB — TSH: TSH: 0.97 u[IU]/mL (ref 0.35–5.50)

## 2023-08-19 LAB — LDL CHOLESTEROL, DIRECT: Direct LDL: 160 mg/dL

## 2023-08-19 MED ORDER — TRAZODONE HCL 50 MG PO TABS
25.0000 mg | ORAL_TABLET | Freq: Every evening | ORAL | 3 refills | Status: DC | PRN
Start: 2023-08-19 — End: 2024-07-17

## 2023-08-19 MED ORDER — PREMARIN 0.625 MG/GM VA CREA: TOPICAL_CREAM | VAGINAL | 12 refills | Status: AC

## 2023-08-19 NOTE — Patient Instructions (Addendum)
Try using allegra for the post nasal drip .  Take it at bedtime   I am refilling  trazodone  for Prn use .  Start with  25 mg 30 to 60 minutes before bedtime.  The dose may be advanced  every 3 or 4 days  as needed to maximum dose of 100 mg nightly     You might want to try using Relaxium for insomnia  (as seen on TV commercials) . It is available through [employer] and contains all natural supplements:  Melatonin 5 mg  Chamomile 25 mg Passionflower extract 75 mg GABA 100 mg Ashwaganda extract 125 mg Magnesium citrate, glycinate, oxide (100 mg)  L tryptophan 500 mg Valerest (proprietary  ingredient ; probably valeria root extract)

## 2023-08-19 NOTE — Assessment & Plan Note (Addendum)
NOTED ON CHEST IMAGING.  LIFELONG NONSMOKER.  RECOMMENDING PULMONOLOGY REFERRAL BUT SHE DEFERS  BECAUSE SHE IS ASYMPTOMATIC.  CHECKING ALPH 1 ANTITRYPSIN AND Recommending RSV vaccine (AGAIN)/  STILL WALKS 2 MILES  3 TIMES PER WEEK.

## 2023-08-19 NOTE — Assessment & Plan Note (Signed)

## 2023-08-19 NOTE — Assessment & Plan Note (Signed)
MULTIPLE REMOVED jULY 2024.  FOLLOW UP NEEDED IN 2029 PROVIDED SHE IS IN GOOD HEALTH

## 2023-08-19 NOTE — Progress Notes (Unsigned)
Patient ID: Sally Morgan, female    DOB: Mar 14, 1946  Age: 77 y.o. MRN: [id number]  The patient is here for annual preventive examination and management of other chronic and acute problems.   The risk factors are reflected in the social history.  The roster of all physicians providing medical care to patient - is listed in the Snapshot section of the chart.  Activities of daily living:  The patient is 100% independent in all ADLs: dressing, toileting, feeding as well as independent mobility  Home safety : The patient has smoke detectors in the home. They wear seatbelts.  There are no firearms at home. There is no violence in the home.   There is no risks for hepatitis, STDs or HIV. There is no   history of blood transfusion. They have no travel history to infectious disease endemic areas of the world.  The patient has seen their dentist in the last six month. They have seen their eye doctor in the last year. They admit to slight hearing difficulty with regard to whispered voices and some television programs.  They have deferred audiologic testing in the last year.  They do not  have excessive sun exposure. Discussed the need for sun protection: hats, long sleeves and use of sunscreen if there is significant sun exposure.   Diet: the importance of a healthy diet is discussed. They do have a healthy diet.  The benefits of regular aerobic exercise were discussed. She walks 3 times per week ,  2 miles without shortness of breath    Depression screen: there are no signs or vegative symptoms of depression- irritability, change in appetite, anhedonia, sadness/tearfullness.  Cognitive assessment: the patient manages all their financial and personal affairs and is actively engaged. They could relate day,date,year and events; recalled 2/3 objects at 3 minutes; performed clock-face test normally.  The following portions of the patient's history were reviewed and updated as appropriate: allergies,  current medications, past family history, past medical history,  past surgical history, past social history  and problem list.  Visual acuity was not assessed per patient preference since she has regular follow up with her ophthalmologist. Hearing and body mass index were assessed and reviewed.   During the course of the visit the patient was educated and counseled about appropriate screening and preventive services including : fall prevention , diabetes screening, nutrition counseling, colorectal cancer screening, and recommended immunizations.    CC: The primary encounter diagnosis was Mixed hyperlipidemia. Diagnoses of Hypertension, unspecified type, Long-term use of high-risk medication, Centrilobular emphysema (HCC), Tubular adenoma of colon, and Encounter for preventive health examination were also pertinent to this visit.  Fecal soiling secondary to hemorrhoids. .  Declines referral for banding.  Post nasal drip in the morning.  Fully recovered from pneumonia this summer.  Thinks she had RSV  History Sally Morgan has a past medical history of Aortic atherosclerosis (HCC), Arthritis, Dilated congestive cardiomyopathy (HCC), Diverticulitis, colon, GERD (gastroesophageal reflux disease), Hypertension, and TIA (transient ischemic attack).   She has a past surgical history that includes Cesarean section; colonoscopy with polypectomy; Colonoscopy with propofol (N/A, 09/18/2017); Colonoscopy with propofol (N/A, 07/22/2023); and polypectomy (07/22/2023).   Her family history includes Appendicitis in her father; Breast cancer in her cousin; Cancer (age of onset: 85) in her brother; Early death in her father and mother; Tuberculosis in her mother.She reports that she has never smoked. She has never used smokeless tobacco. She reports current alcohol use of about 4.0 standard drinks of alcohol  per week. She reports that she does not use drugs.  Outpatient Medications Prior to Visit  Medication Sig Dispense  Refill   Cholecalciferol (VITAMIN D3) 50 MCG (2000 UT) CAPS Take 1 capsule by mouth daily.     losartan (COZAAR) 25 MG tablet TAKE 1 TABLET(25 MG) BY MOUTH DAILY 30 tablet 1   Multiple Vitamins-Minerals (ICAPS AREDS 2 PO) Take by mouth.     Multiple Vitamins-Minerals (WOMENS MULTIVITAMIN PO) Take 1 tablet by mouth.     conjugated estrogens (PREMARIN) vaginal cream Apply 0.5mg  (pea-sized amount)  just inside the vaginal introitus with a finger-tip on  Monday, Wednesday and Friday nights. 30 g 12   chlorpheniramine-HYDROcodone (TUSSIONEX) 10-8 MG/5ML Take 5 mLs by mouth at bedtime as needed for cough. (Patient not taking: Reported on 07/22/2023) 70 mL 0   Cranberry 1000 MG CAPS Take 2 capsules by mouth daily. (Patient not taking: Reported on 07/22/2023)     Evolocumab (REPATHA SURECLICK) 140 MG/ML SOAJ Inject 140 mg into the skin every 14 (fourteen) days. (Patient not taking: Reported on 04/15/2023) 2 mL 3   moxifloxacin (AVELOX) 400 MG tablet Take 1 tablet (400 mg total) by mouth daily. (Patient not taking: Reported on 07/22/2023) 2 tablet 0   predniSONE (DELTASONE) 20 MG tablet Take 2 tablets (40 mg total) by mouth daily with breakfast. (Patient not taking: Reported on 07/22/2023) 10 tablet 0   Facility-Administered Medications Prior to Visit  Medication Dose Route Frequency Provider Last Rate Last Admin   betamethasone acetate-betamethasone sodium phosphate (CELESTONE) injection 3 mg  3 mg Intramuscular Once Gala Lewandowsky M, DPM       betamethasone acetate-betamethasone sodium phosphate (CELESTONE) injection 3 mg  3 mg Intramuscular Once Felecia Shelling, DPM        Review of Systems  Patient denies headache, fevers, malaise, unintentional weight loss, skin rash, eye pain, sinus congestion and sinus pain, sore throat, dysphagia,  hemoptysis , cough, dyspnea, wheezing, chest pain, palpitations, orthopnea, edema, abdominal pain, nausea, melena, diarrhea, constipation, flank pain, dysuria, hematuria,  urinary  Frequency, nocturia, numbness, tingling, seizures,  Focal weakness, Loss of consciousness,  Tremor, insomnia, depression, anxiety, and suicidal ideation.    Objective:  BP 132/76   Pulse 78   Ht 5' 5.5" (1.664 m)   Wt 152 lb 3.2 oz (69 kg)   SpO2 99%   BMI 24.94 kg/m   Physical Exam Vitals reviewed.  Constitutional:      General: She is not in acute distress.    Appearance: Normal appearance. She is well-developed and normal weight. She is not ill-appearing, toxic-appearing or diaphoretic.  HENT:     Head: Normocephalic.     Right Ear: Tympanic membrane, ear canal and external ear normal. There is no impacted cerumen.     Left Ear: Tympanic membrane, ear canal and external ear normal. There is no impacted cerumen.     Nose: Nose normal.     Mouth/Throat:     Mouth: Mucous membranes are moist.     Pharynx: Oropharynx is clear.  Eyes:     General: No scleral icterus.       Right eye: No discharge.        Left eye: No discharge.     Conjunctiva/sclera: Conjunctivae normal.     Pupils: Pupils are equal, round, and reactive to light.  Neck:     Thyroid: No thyromegaly.     Vascular: No carotid bruit or JVD.  Cardiovascular:     Rate and  Rhythm: Normal rate and regular rhythm.     Heart sounds: Normal heart sounds.  Pulmonary:     Effort: Pulmonary effort is normal. No respiratory distress.     Breath sounds: Normal breath sounds.  Chest:  Breasts:    Breasts are symmetrical.     Right: Normal. No swelling, inverted nipple, mass, nipple discharge, skin change or tenderness.     Left: Normal. No swelling, inverted nipple, mass, nipple discharge, skin change or tenderness.  Abdominal:     General: Bowel sounds are normal.     Palpations: Abdomen is soft. There is no mass.     Tenderness: There is no abdominal tenderness. There is no guarding or rebound.  Musculoskeletal:        General: Normal range of motion.     Cervical back: Normal range of motion and neck  supple.  Lymphadenopathy:     Cervical: No cervical adenopathy.     Upper Body:     Right upper body: No supraclavicular, axillary or pectoral adenopathy.     Left upper body: No supraclavicular, axillary or pectoral adenopathy.  Skin:    General: Skin is warm and dry.  Neurological:     General: No focal deficit present.     Mental Status: She is alert and oriented to person, place, and time. Mental status is at baseline.  Psychiatric:        Mood and Affect: Mood normal.        Behavior: Behavior normal.        Thought Content: Thought content normal.        Judgment: Judgment normal.   Assessment & Plan:  Mixed hyperlipidemia -     Lipid panel -     LDL cholesterol, direct -     Hemoglobin A1c  Hypertension, unspecified type -     Comprehensive metabolic panel -     Microalbumin / creatinine urine ratio  Long-term use of high-risk medication -     CBC with Differential/Platelet -     TSH  Centrilobular emphysema (HCC) Assessment & Plan: NOTED ON CHEST IMAGING.  LIFELONG NONSMOKER.  RECOMMENDING PULMONOLOGY REFERRAL BUT SHE DEFERS  BECAUSE SHE IS ASYMPTOMATIC.  CHECKING ALPH 1 ANTITRYPSIN AND Recommending RSV vaccine (AGAIN)/  STILL WALKS 2 MILES  3 TIMES PER WEEK.    Orders: -     Alpha-1 antitrypsin phenotype  Tubular adenoma of colon Assessment & Plan: MULTIPLE REMOVED jULY 2024.  FOLLOW UP NEEDED IN 2029 PROVIDED SHE IS IN GOOD HEALTH    Encounter for preventive health examination Assessment & Plan: age appropriate education and counseling updated, referrals for preventative services and immunizations addressed, dietary and smoking counseling addressed, most recent labs reviewed.  I have personally reviewed and have noted:   1) the patient's medical and social history 2) The pt's use of alcohol, tobacco, and illicit drugs 3) The patient's current medications and supplements 4) Functional ability including ADL's, fall risk, home safety risk, hearing and visual  impairment 5) Diet and physical activities 6) Evidence for depression or mood disorder 7) The patient's height, weight, and BMI have been recorded in the chart   I have made referrals, and provided counseling and education based on review of the above    Other orders -     Premarin; Apply 0.5mg  (pea-sized amount)  just inside the vaginal introitus with a finger-tip on  Monday, Wednesday and Friday nights.  Dispense: 30 g; Refill: 12 -  traZODone HCl; Take 0.5-1 tablets (25-50 mg total) by mouth at bedtime as needed for sleep.  Dispense: 30 tablet; Refill: 3      I provided 40 minutes of  face-to-face time during this encounter reviewing patient's current problems and past surgeries,  recent labs and imaging studies, providing counseling on the above mentioned problems , and coordination  of care .   Follow-up: No follow-ups on file.   Sherlene Shams, MD

## 2023-08-20 LAB — MICROALBUMIN / CREATININE URINE RATIO
Creatinine,U: 39.6 mg/dL
Microalb Creat Ratio: 6 mg/g (ref 0.0–30.0)
Microalb, Ur: 2.4 mg/dL — ABNORMAL HIGH (ref 0.0–1.9)

## 2023-08-22 MED ORDER — LOSARTAN POTASSIUM 50 MG PO TABS: ORAL_TABLET | ORAL | 0 refills | Status: DC

## 2023-08-22 NOTE — Assessment & Plan Note (Signed)
Advised to increase losartan to 50 mg daily for new onset proteinuria   Lab Results  Component Value Date   LABMICR See below: 06/08/2020   LABMICR See below: 04/04/2020   MICROALBUR 2.4 (H) 08/19/2023   MICROALBUR 0.4 11/05/2013

## 2023-08-22 NOTE — Addendum Note (Signed)
Addended by: Sherlene Shams on: 08/22/2023 07:06 PM   Modules accepted: Orders

## 2023-08-28 LAB — ALPHA-1 ANTITRYPSIN PHENOTYPE: A-1 Antitrypsin, Ser: 136 mg/dL (ref 83–199)

## 2023-09-15 NOTE — Progress Notes (Unsigned)
Cardiology Office Note  Date:  09/16/2023   ID:  Sally Morgan, DOB 04-Sep-1946, MRN [id number]  PCP:  Sherlene Shams, MD   Chief Complaint  Patient presents with   12 month follow up     "Doing well."     HPI:  Sally Morgan  is a pleasant 77 year-old woman who has a history of  hyperlipidemia,  vein ablation in on the left  post procedure DVT noted on ultrasound.  started on aspirin,  changed to xarelto twice a day after the thrombus extended, for 2 weeks Dilated aortic root 3.7 to 3.9 centimeters Baseline EKG with diffuse T-wave abnormality Mild plaque in the aorta CT scan 2015, 2022  She presents for routine followup of her hyperlipidemia and dilated aortic root, aortic atherosclerosis  Last seen by myself in clinic May 2023  Poor sleep, restless leg Better if she "does not overdo"  No chest pain, walks 2 miles three times a week  Losartan up to 50 daily, protein in urine  Reports that she stopped Repatha, unclear side effects Willing to try water-soluble medication beginning with a "c" (Crestor?)  Lab work reviewed LDL 160, stopped taking Repatha A1c 5.9  CT chest images pulled up again and reviewed on today's visit Mild to moderate aortic atherosclerosis, mild to moderate coronary calcification notably in the LAD  Aorta size unchanged based on echo April 2023  Denies chest pain concerning for angina, good exercise tolerance  EKG personally reviewed by myself on todays visit EKG Interpretation Date/Time:  Monday September 16 2023 08:59:10 EST Ventricular Rate:  67 PR Interval:  156 QRS Duration:  84 QT Interval:  388 QTC Calculation: 409 R Axis:   25  Text Interpretation: Normal sinus rhythm ST & T wave abnormality, consider anterior ischemia No previous ECGs available Confirmed by Julien Nordmann [phone]) on 09/16/2023 9:23:03 AM    previous echocardiograms and CT scans  CT scan 2015 CT scan chest 2015 showing ascending aortic dilatation 3.7 cm Mild   aortic arch plaque, minimal descending aorta plaque, no coronary calcification  Echo, numbers reviewed with her today Echo 11/2019: aorta 3.9 cm, reviewed in detail today  stable aortic root 4.1 cm in 2016 echocardiogram 3.5 cm in 2017 Echocardiogram in 2015 showed aortic root 4.2 cm with ejection fraction 45-50%, mildly dilated left ventricle (possible stress cardiomyopathy)   PMH:   has a past medical history of Aortic atherosclerosis (HCC), Arthritis, Dilated congestive cardiomyopathy (HCC), Diverticulitis, colon, GERD (gastroesophageal reflux disease), Hypertension, and TIA (transient ischemic attack).  PSH:    Past Surgical History:  Procedure Laterality Date   CESAREAN SECTION     x 2    colonoscopy with polypectomy     COLONOSCOPY WITH PROPOFOL N/A 09/18/2017   Procedure: COLONOSCOPY WITH PROPOFOL;  Surgeon: Scot Jun, MD;  Location: [facility] ENDOSCOPY;  Service: Endoscopy;  Laterality: N/A;   COLONOSCOPY WITH PROPOFOL N/A 07/22/2023   Procedure: COLONOSCOPY WITH PROPOFOL;  Surgeon: Regis Bill, MD;  Location: [facility] ENDOSCOPY;  Service: Endoscopy;  Laterality: N/A;   POLYPECTOMY  07/22/2023   Procedure: POLYPECTOMY;  Surgeon: Regis Bill, MD;  Location: [facility] ENDOSCOPY;  Service: Endoscopy;;    Current Outpatient Medications  Medication Sig Dispense Refill   Cholecalciferol (VITAMIN D3) 50 MCG (2000 UT) CAPS Take 1 capsule by mouth daily.     conjugated estrogens (PREMARIN) vaginal cream Apply 0.5mg  (pea-sized amount)  just inside the vaginal introitus with a finger-tip on  Monday, Wednesday and Friday  nights. 30 g 12   losartan (COZAAR) 50 MG tablet Take 50 mg by mouth daily.     Multiple Vitamins-Minerals (ICAPS AREDS 2 PO) Take by mouth.     Multiple Vitamins-Minerals (WOMENS MULTIVITAMIN PO) Take 1 tablet by mouth.     traZODone (DESYREL) 50 MG tablet Take 0.5-1 tablets (25-50 mg total) by mouth at bedtime as needed for sleep. 30 tablet 3   Current  Facility-Administered Medications  Medication Dose Route Frequency Provider Last Rate Last Admin   betamethasone acetate-betamethasone sodium phosphate (CELESTONE) injection 3 mg  3 mg Intramuscular Once Gala Lewandowsky M, DPM       betamethasone acetate-betamethasone sodium phosphate (CELESTONE) injection 3 mg  3 mg Intramuscular Once Felecia Shelling, DPM        Allergies:   Nickel, Lisinopril, and Meloxicam   Social History:  The patient  reports that she has never smoked. She has never used smokeless tobacco. She reports current alcohol use of about 4.0 standard drinks of alcohol per week. She reports that she does not use drugs.   Family History:   family history includes Appendicitis in her father; Breast cancer in her cousin; Cancer (age of onset: 11) in her brother; Early death in her father and mother; Tuberculosis in her mother.    Review of Systems: Review of Systems  Constitutional: Negative.   HENT: Negative.    Respiratory: Negative.    Cardiovascular: Negative.   Gastrointestinal: Negative.   Musculoskeletal: Negative.   Neurological: Negative.   Psychiatric/Behavioral: Negative.    All other systems reviewed and are negative.   PHYSICAL EXAM: VS:  BP (!) 140/80 (BP Location: Left Arm, Patient Position: Sitting, Cuff Size: Normal)   Pulse 67   Ht 5\' 5"  (1.651 m)   Wt 153 lb 6 oz (69.6 kg)   SpO2 97%   BMI 25.52 kg/m  , BMI Body mass index is 25.52 kg/m. Constitutional:  oriented to person, place, and time. No distress.  HENT:  Head: Grossly normal Eyes:  no discharge. No scleral icterus.  Neck: No JVD, no carotid bruits  Cardiovascular: Regular rate and rhythm, no murmurs appreciated Pulmonary/Chest: Clear to auscultation bilaterally, no wheezes or rails Abdominal: Soft.  no distension.  no tenderness.  Musculoskeletal: Normal range of motion Neurological:  normal muscle tone. Coordination normal. No atrophy Skin: Skin warm and dry Psychiatric: normal affect,  pleasant  Recent Labs: 08/19/2023: ALT 25; BUN 15; Creatinine, Ser 0.66; Hemoglobin 12.7; Platelets 223.0; Potassium 3.9; Sodium 137; TSH 0.97    Lipid Panel Lab Results  Component Value Date   CHOL 233 (H) 08/19/2023   HDL 65.60 08/19/2023   LDLCALC 153 (H) 08/19/2023   TRIG 75.0 08/19/2023      Wt Readings from Last 3 Encounters:  09/16/23 153 lb 6 oz (69.6 kg)  08/19/23 152 lb 3.2 oz (69 kg)  07/22/23 150 lb (68 kg)      ASSESSMENT AND PLAN:  Deep venous thrombosis of femoral vein with thrombophlebitis, unspecified laterality (HCC) stable, no recurrence  Aortic atherosclerosis (HCC) CT scan images pulled up and reviewed again Mild to moderate plaque noted in the aortic arch Also mild to moderate coronary calcification particularly in LAD On previous discussions, did not want a statin or Zetia Did not tolerate Repatha due to unclear side effects Willing to try water-soluble medication beginning with "c".  We will send in Crestor 5 mg daily  Mixed hyperlipidemia LDL up to 160 Did not tolerate Repatha goal LDL  less than 70 As she request we will start Crestor 5 daily Alternatively Praluent, leqvio  Dilated aortic root (HCC) images reviewed CT scan, echocardiograms Stable aorta and ascending aorta Consider repeat echo in 2025  Congestive dilated cardiomyopathy (HCC)  Ejection fraction 45 to 50% in 2015 Normalized in 2016 Appears euvolemic   Orders Placed This Encounter  Procedures   EKG 12-Lead     Signed, Dossie Arbour, M.D., Ph.D. 09/16/2023  [facility] [place], [place] [phone]

## 2023-09-16 ENCOUNTER — Ambulatory Visit: Payer: PPO | Attending: Cardiovascular Disease | Admitting: Cardiovascular Disease

## 2023-09-16 ENCOUNTER — Encounter: Payer: Self-pay | Admitting: Cardiovascular Disease

## 2023-09-16 VITALS — BP 140/80 | HR 67 | Ht 65.0 in | Wt 153.4 lb

## 2023-09-16 DIAGNOSIS — T466X5D Adverse effect of antihyperlipidemic and antiarteriosclerotic drugs, subsequent encounter: Secondary | ICD-10-CM | POA: Diagnosis not present

## 2023-09-16 DIAGNOSIS — I340 Nonrheumatic mitral (valve) insufficiency: Secondary | ICD-10-CM | POA: Diagnosis not present

## 2023-09-16 DIAGNOSIS — E782 Mixed hyperlipidemia: Secondary | ICD-10-CM

## 2023-09-16 DIAGNOSIS — I77810 Thoracic aortic ectasia: Secondary | ICD-10-CM | POA: Diagnosis not present

## 2023-09-16 DIAGNOSIS — I700 Atherosclerosis of aorta: Secondary | ICD-10-CM | POA: Diagnosis not present

## 2023-09-16 DIAGNOSIS — M791 Myalgia, unspecified site: Secondary | ICD-10-CM

## 2023-09-16 MED ORDER — ROSUVASTATIN CALCIUM 5 MG PO TABS: 5.0000 mg | ORAL_TABLET | Freq: Every day | ORAL | 3 refills | Status: DC

## 2023-09-16 NOTE — Addendum Note (Signed)
Addended by: Jani Gravel on: 09/16/2023 10:35 AM   Modules accepted: Orders

## 2023-09-16 NOTE — Patient Instructions (Addendum)
Medication Instructions:  Please try crestor 5 mg daily  If you need a refill on your cardiac medications before your next appointment, please call your pharmacy.   Lab work: Call when you are ready to check your cholesterol.  Testing/Procedures: No new testing needed  Follow-Up: At [facility], you and your health needs are our priority.  As part of our continuing mission to provide you with exceptional heart care, we have created designated Provider Care Teams.  These Care Teams include your primary Cardiologist (physician) and Advanced Practice Providers (APPs -  Physician Assistants and Nurse Practitioners) who all work together to provide you with the care you need, when you need it.  You will need a follow up appointment in 12 months  Providers on your designated Care Team:   Nicolasa Ducking, NP Eula Listen, PA-C Cadence Fransico Michael, [place]  COVID-19 Vaccine Information can be found at: [email or web address] For questions related to vaccine distribution or appointments, please email [email or web address] or call [phone].

## 2023-11-05 ENCOUNTER — Ambulatory Visit: Payer: PPO | Admitting: Internal Medicine

## 2023-11-08 ENCOUNTER — Ambulatory Visit (INDEPENDENT_AMBULATORY_CARE_PROVIDER_SITE_OTHER): Payer: PPO | Admitting: Internal Medicine

## 2023-11-08 ENCOUNTER — Encounter: Payer: Self-pay | Admitting: Internal Medicine

## 2023-11-08 VITALS — BP 134/82 | HR 82 | Ht 65.0 in | Wt 153.6 lb

## 2023-11-08 DIAGNOSIS — M5412 Radiculopathy, cervical region: Secondary | ICD-10-CM | POA: Diagnosis not present

## 2023-11-08 DIAGNOSIS — M4692 Unspecified inflammatory spondylopathy, cervical region: Secondary | ICD-10-CM

## 2023-11-08 DIAGNOSIS — M154 Erosive (osteo)arthritis: Secondary | ICD-10-CM

## 2023-11-08 DIAGNOSIS — M25542 Pain in joints of left hand: Secondary | ICD-10-CM

## 2023-11-08 MED ORDER — PREDNISONE 10 MG PO TABS: ORAL_TABLET | ORAL | 0 refills | Status: DC

## 2023-11-08 MED ORDER — TIZANIDINE HCL 2 MG PO TABS: 2.0000 mg | ORAL_TABLET | Freq: Four times a day (QID) | ORAL | 0 refills | Status: DC | PRN

## 2023-11-08 NOTE — Patient Instructions (Addendum)
Cervical radiculopathy (see description below)   Please go to Outpatient imaging on Monday for x rays of your neck and finger  Start the prednisone taper tomorrow morning.  Use heat for 15 minutes to relax muscles.  Every 6 hours  Tizanidine muscle relaxer may make you sleepy,  but you can use it every 6 hours to help relax the muscles in your shoulder  Also try the  SalonPas Lidocaine patches   You can take  up to 2000 mg of acetominophen (tylenol) every day safely  In divided doses (500 mg every 6 hours  Or 1000 mg every 12 hours.)

## 2023-11-08 NOTE — Progress Notes (Unsigned)
Subjective:  Patient ID: Sally Morgan, female    DOB: 03/11/1946  Age: 78 y.o. MRN: [id number]  CC: There were no encounter diagnoses.   HPI DAVANEE KLINKNER presents for evaluation of persistnet left sided back and shoulder pain x 1 month     Outpatient Medications Prior to Visit  Medication Sig Dispense Refill   Cholecalciferol (VITAMIN D3) 50 MCG (2000 UT) CAPS Take 1 capsule by mouth daily.     conjugated estrogens (PREMARIN) vaginal cream Apply 0.5mg  (pea-sized amount)  just inside the vaginal introitus with a finger-tip on  Monday, Wednesday and Friday nights. 30 g 12   losartan (COZAAR) 50 MG tablet Take 50 mg by mouth daily.     Multiple Vitamins-Minerals (ICAPS AREDS 2 PO) Take by mouth.     Multiple Vitamins-Minerals (WOMENS MULTIVITAMIN PO) Take 1 tablet by mouth.     rosuvastatin (CRESTOR) 5 MG tablet Take 1 tablet (5 mg total) by mouth daily. 90 tablet 3   traZODone (DESYREL) 50 MG tablet Take 0.5-1 tablets (25-50 mg total) by mouth at bedtime as needed for sleep. 30 tablet 3   Facility-Administered Medications Prior to Visit  Medication Dose Route Frequency Provider Last Rate Last Admin   betamethasone acetate-betamethasone sodium phosphate (CELESTONE) injection 3 mg  3 mg Intramuscular Once Gala Lewandowsky M, DPM       betamethasone acetate-betamethasone sodium phosphate (CELESTONE) injection 3 mg  3 mg Intramuscular Once Felecia Shelling, DPM        Review of Systems;  Patient denies headache, fevers, malaise, unintentional weight loss, skin rash, eye pain, sinus congestion and sinus pain, sore throat, dysphagia,  hemoptysis , cough, dyspnea, wheezing, chest pain, palpitations, orthopnea, edema, abdominal pain, nausea, melena, diarrhea, constipation, flank pain, dysuria, hematuria, urinary  Frequency, nocturia, numbness, tingling, seizures,  Focal weakness, Loss of consciousness,  Tremor, insomnia, depression, anxiety, and suicidal ideation.      Objective:  There  were no vitals taken for this visit.  BP Readings from Last 3 Encounters:  09/16/23 (!) 140/80  08/19/23 132/76  07/22/23 110/68    Wt Readings from Last 3 Encounters:  09/16/23 153 lb 6 oz (69.6 kg)  08/19/23 152 lb 3.2 oz (69 kg)  07/22/23 150 lb (68 kg)    Physical Exam  Lab Results  Component Value Date   HGBA1C 5.9 08/19/2023    Lab Results  Component Value Date   CREATININE 0.66 08/19/2023   CREATININE 0.69 08/27/2022   CREATININE 0.74 08/17/2021    Lab Results  Component Value Date   WBC 4.8 08/19/2023   HGB 12.7 08/19/2023   HCT 38.9 08/19/2023   PLT 223.0 08/19/2023   GLUCOSE 90 08/19/2023   CHOL 233 (H) 08/19/2023   TRIG 75.0 08/19/2023   HDL 65.60 08/19/2023   LDLDIRECT 160.0 08/19/2023   LDLCALC 153 (H) 08/19/2023   ALT 25 08/19/2023   AST 24 08/19/2023   NA 137 08/19/2023   K 3.9 08/19/2023   CL 102 08/19/2023   CREATININE 0.66 08/19/2023   BUN 15 08/19/2023   CO2 28 08/19/2023   TSH 0.97 08/19/2023   HGBA1C 5.9 08/19/2023   MICROALBUR 2.4 (H) 08/19/2023    No results found.  Assessment & Plan:  .There are no diagnoses linked to this encounter.   I provided 30 minutes of face-to-face time during this encounter reviewing patient's last visit with me, patient's  most recent visit with cardiology,  nephrology,  and neurology,  recent surgical  and non surgical procedures, previous  labs and imaging studies, counseling on currently addressed issues,  and post visit ordering to diagnostics and therapeutics .   Follow-up: No follow-ups on file.   Sherlene Shams, MD

## 2023-11-10 ENCOUNTER — Encounter: Payer: Self-pay | Admitting: Internal Medicine

## 2023-11-10 DIAGNOSIS — M4692 Unspecified inflammatory spondylopathy, cervical region: Secondary | ICD-10-CM | POA: Insufficient documentation

## 2023-11-10 NOTE — Assessment & Plan Note (Addendum)
Suggested by history, exam and remote cervical spine films.  No warning signs suggestive of cord compression . Prednisone taper, muscle relaxer,  cervical spine films to evaluate alignment

## 2023-11-11 ENCOUNTER — Ambulatory Visit
Admission: RE | Admit: 2023-11-11 | Discharge: 2023-11-11 | Disposition: A | Discharge status: Home / Self Care | Payer: PPO | Attending: Internal Medicine | Admitting: Internal Medicine

## 2023-11-11 ENCOUNTER — Ambulatory Visit
Admission: RE | Admit: 2023-11-11 | Discharge: 2023-11-11 | Disposition: A | Discharge status: Home / Self Care | Payer: PPO | Source: Ambulatory Visit | Attending: Internal Medicine | Admitting: Internal Medicine

## 2023-11-11 ENCOUNTER — Other Ambulatory Visit: Payer: Self-pay | Admitting: Internal Medicine

## 2023-11-11 DIAGNOSIS — M4692 Unspecified inflammatory spondylopathy, cervical region: Secondary | ICD-10-CM

## 2023-11-11 DIAGNOSIS — M25542 Pain in joints of left hand: Secondary | ICD-10-CM | POA: Insufficient documentation

## 2023-11-11 DIAGNOSIS — M5412 Radiculopathy, cervical region: Secondary | ICD-10-CM | POA: Diagnosis not present

## 2023-11-11 DIAGNOSIS — M4802 Spinal stenosis, cervical region: Secondary | ICD-10-CM | POA: Diagnosis not present

## 2023-11-11 DIAGNOSIS — M19042 Primary osteoarthritis, left hand: Secondary | ICD-10-CM | POA: Diagnosis not present

## 2023-11-11 DIAGNOSIS — M4722 Other spondylosis with radiculopathy, cervical region: Secondary | ICD-10-CM | POA: Diagnosis not present

## 2023-11-11 DIAGNOSIS — M542 Cervicalgia: Secondary | ICD-10-CM | POA: Diagnosis not present

## 2023-11-11 DIAGNOSIS — M79642 Pain in left hand: Secondary | ICD-10-CM | POA: Diagnosis not present

## 2023-11-11 DIAGNOSIS — M7989 Other specified soft tissue disorders: Secondary | ICD-10-CM | POA: Diagnosis not present

## 2023-11-11 NOTE — Telephone Encounter (Signed)
Copied from CRM [id number]. Topic: Clinical - Medication Refill >> Nov 11, 2023  8:41 AM Truddie Crumble wrote: Most Recent Primary Care Visit:  Provider: Sherlene Shams  Department: [facility]  Visit Type: PHYSICAL  Date: 08/19/2023  Medication: predniSONE  Has the patient contacted their pharmacy? Yes (Agent: If no, request that the patient contact the pharmacy for the refill. If patient does not wish to contact the pharmacy document the reason why and proceed with request.) (Agent: If yes, when and what did the pharmacy advise?)  Is this the correct pharmacy for this prescription? Yes If no, delete pharmacy and type the correct one.  This is the patient's preferred pharmacy:  [id number] - [place], [place] - [address] AT [facility] [address] [facility] [address] [place] [address] Phone: [phone] Fax: [phone]   Has the prescription been filled recently? No  Is the patient out of the medication? Yes  Has the patient been seen for an appointment in the last year OR does the patient have an upcoming appointment? Yes  Can we respond through MyChart? No  Agent: Please be advised that Rx refills may take up to 3 business days. We ask that you follow-up with your pharmacy.

## 2023-11-12 ENCOUNTER — Other Ambulatory Visit: Payer: Self-pay | Admitting: Internal Medicine

## 2023-11-17 NOTE — Addendum Note (Signed)
Addended by: Sherlene Shams on: 11/17/2023 09:17 PM   Modules accepted: Orders

## 2023-11-18 ENCOUNTER — Encounter: Payer: Self-pay | Admitting: *Deleted

## 2023-11-18 ENCOUNTER — Other Ambulatory Visit: Payer: Self-pay | Admitting: Internal Medicine

## 2023-11-29 ENCOUNTER — Other Ambulatory Visit (INDEPENDENT_AMBULATORY_CARE_PROVIDER_SITE_OTHER): Payer: HMO

## 2023-11-29 DIAGNOSIS — M154 Erosive (osteo)arthritis: Secondary | ICD-10-CM | POA: Diagnosis not present

## 2023-11-29 NOTE — Addendum Note (Signed)
Addended by: Jarvis Morgan D on: 11/29/2023 03:24 PM   Modules accepted: Orders

## 2023-11-30 LAB — C-REACTIVE PROTEIN: CRP: <1 mg/L (ref 0–10)

## 2023-11-30 LAB — ANA: Anti Nuclear Antibody (ANA): POSITIVE — AB

## 2023-11-30 LAB — SEDIMENTATION RATE: Sed Rate: 4 mm/h (ref 0–40)

## 2023-11-30 LAB — RHEUMATOID FACTOR: Rheumatoid fact SerPl-aCnc: <10 [IU]/mL (ref <14.0)

## 2023-11-30 LAB — URIC ACID: Uric Acid: 3.4 mg/dL (ref 3.1–7.9)

## 2023-12-03 ENCOUNTER — Other Ambulatory Visit: Payer: Self-pay | Admitting: Internal Medicine

## 2023-12-03 DIAGNOSIS — R768 Other specified abnormal immunological findings in serum: Secondary | ICD-10-CM | POA: Insufficient documentation

## 2023-12-03 DIAGNOSIS — M2550 Pain in unspecified joint: Secondary | ICD-10-CM

## 2023-12-03 NOTE — Assessment & Plan Note (Signed)
 Additional serologies needed

## 2024-01-15 ENCOUNTER — Telehealth: Payer: Self-pay | Admitting: Cardiovascular Disease

## 2024-01-15 ENCOUNTER — Ambulatory Visit: Payer: PPO | Attending: Cardiovascular Disease

## 2024-01-15 ENCOUNTER — Other Ambulatory Visit: Payer: Self-pay | Admitting: Emergency Medicine

## 2024-01-15 DIAGNOSIS — Z79899 Other long term (current) drug therapy: Secondary | ICD-10-CM

## 2024-01-15 DIAGNOSIS — I77810 Thoracic aortic ectasia: Secondary | ICD-10-CM

## 2024-01-15 DIAGNOSIS — I700 Atherosclerosis of aorta: Secondary | ICD-10-CM

## 2024-01-15 LAB — ECHOCARDIOGRAM COMPLETE
AR max vel: 3.12 cm2
AV Area VTI: 3.16 cm2
AV Area mean vel: 2.97 cm2
AV Mean grad: 2 mmHg
AV Peak grad: 3.1 mmHg
Ao pk vel: 0.88 m/s
Area-P 1/2: 5.27 cm2
S' Lateral: 2.4 cm

## 2024-01-15 NOTE — Telephone Encounter (Signed)
 Patient has not been fasting. Recommended to patient to come back another morning for Lipid Panel when she has been fasting. Patient verbalized understanding.

## 2024-01-15 NOTE — Telephone Encounter (Signed)
 Patient was told that she would need to come by in 3 months to get cholesterol checked/per her AVS from her appointment with Dr. Mariah Milling back in December. Please advise if she can get testing done today or which date.

## 2024-01-22 ENCOUNTER — Encounter: Payer: Self-pay | Admitting: Emergency Medicine

## 2024-01-28 ENCOUNTER — Encounter

## 2024-02-04 ENCOUNTER — Other Ambulatory Visit: Payer: Self-pay | Admitting: *Deleted

## 2024-02-04 DIAGNOSIS — Z79899 Other long term (current) drug therapy: Secondary | ICD-10-CM | POA: Diagnosis not present

## 2024-02-05 LAB — LIPID PANEL
Chol/HDL Ratio: 2.5 ratio (ref 0.0–4.4)
Cholesterol, Total: 140 mg/dL (ref 100–199)
HDL: 55 mg/dL (ref >39)
LDL Chol Calc (NIH): 69 mg/dL (ref 0–99)
Triglycerides: 80 mg/dL (ref 0–149)
VLDL Cholesterol Cal: 16 mg/dL (ref 5–40)

## 2024-02-10 ENCOUNTER — Encounter: Payer: Self-pay | Admitting: Emergency Medicine

## 2024-02-16 ENCOUNTER — Other Ambulatory Visit: Payer: Self-pay | Admitting: Internal Medicine

## 2024-02-25 ENCOUNTER — Ambulatory Visit (INDEPENDENT_AMBULATORY_CARE_PROVIDER_SITE_OTHER): Admitting: *Deleted

## 2024-02-25 VITALS — BP 124/82 | Ht 65.0 in | Wt 150.0 lb

## 2024-02-25 DIAGNOSIS — Z0000 Encounter for general adult medical examination without abnormal findings: Secondary | ICD-10-CM

## 2024-02-25 NOTE — Patient Instructions (Signed)
 Sally Morgan , Thank you for taking time out of your busy schedule to complete your Annual Wellness Visit with me. I enjoyed our conversation and look forward to speaking with you again next year. I, as well as your care team,  appreciate your ongoing commitment to your health goals. Please review the following plan we discussed and let me know if I can assist you in the future. Your Game plan/ To Do List    Referrals: If you haven't heard from the office you've been referred to, please reach out to them at the phone provided.   Remember to update your Tetanus (Tdap) and shingles vaccines  Follow up Visits: Next Medicare AWV with our clinical staff: 03/01/25 @ 3:40   Have you seen your provider in the last 6 months (3 months if uncontrolled diabetes)? Yes Next Office Visit with your provider: 04/08/24 @ 3:30  Clinician Recommendations:  Aim for 30 minutes of exercise or brisk walking, 6-8 glasses of water, and 5 servings of fruits and vegetables each day.       This is a list of the screening recommended for you and due dates:  Health Maintenance  Topic Date Due   Zoster (Shingles) Vaccine (1 of 2) Never done   DTaP/Tdap/Td vaccine (2 - Tdap) 02/03/2013   COVID-19 Vaccine (3 - Pfizer risk series) 12/21/2019   Mammogram  03/06/2024   Flu Shot  05/15/2024   Medicare Annual Wellness Visit  02/24/2025   Colon Cancer Screening  07/21/2028   Pneumonia Vaccine  Completed   DEXA scan (bone density measurement)  Completed   Hepatitis C Screening  Completed   HPV Vaccine  Aged Out   Meningitis B Vaccine  Aged Out    Advanced directives: (Copy Requested) Please bring a copy of your health care power of attorney and living will to the office to be added to your chart at your convenience. You can mail to [facility] [address] [place], [place] [address] or email to [email or web address] Advance Care Planning is important because it:  [x]  Makes sure you receive the  medical care that is consistent with your values, goals, and preferences  [x]  It provides guidance to your family and loved ones and reduces their decisional burden about whether or not they are making the right decisions based on your wishes.

## 2024-02-25 NOTE — Progress Notes (Signed)
 Subjective:   Sally Morgan is a 78 y.o. who presents for a Medicare Wellness preventive visit.  As a reminder, Annual Wellness Visits don't include a physical exam, and some assessments may be limited, especially if this visit is performed virtually. We may recommend an in-person visit if needed.  Visit Complete: Virtual I connected with  Sally Morgan on 02/25/24 by a audio enabled telemedicine application and verified that I am speaking with the correct person using two identifiers.  Patient Location: Home  Provider Location: Home Office  I discussed the limitations of evaluation and management by telemedicine. The patient expressed understanding and agreed to proceed.  Vital Signs: Because this visit was a virtual/telehealth visit, some criteria may be missing or patient reported. Any vitals not documented were not able to be obtained and vitals that have been documented are patient reported.  VideoDeclined- This patient declined Librarian, academic. Therefore the visit was completed with audio only.  Persons Participating in Visit: Patient.  AWV Questionnaire: No: Patient Medicare AWV questionnaire was not completed prior to this visit.  Cardiac Risk Factors include: advanced age (>14men, >29 women);dyslipidemia;hypertension     Objective:     Today's Vitals   02/25/24 1459  BP: 124/82  Weight: 150 lb (68 kg)  Height: 5\' 5"  (1.651 m)   Body mass index is 24.96 kg/m.     02/25/2024    3:11 PM 07/22/2023   12:36 PM 01/17/2023    2:31 PM 01/08/2022   10:23 AM 06/06/2020    9:11 AM 06/04/2019    9:27 AM 04/09/2018   11:28 AM  Advanced Directives  Does Patient Have a Medical Advance Directive? Yes Yes Yes Yes Yes Yes Yes  Type of Estate agent of [place];Living will  Healthcare Power of [place];Living will Healthcare Power of [place];Living will Healthcare Power of [place];Living will Healthcare Power of  [place];Living will Healthcare Power of [place];Living will  Does patient want to make changes to medical advance directive?   No - Patient declined No - Patient declined No - Patient declined No - Patient declined No - Patient declined  Copy of Healthcare Power of Attorney in Chart? No - copy requested  No - copy requested No - copy requested No - copy requested No - copy requested No - copy requested    Current Medications (verified) Outpatient Encounter Medications as of 02/25/2024  Medication Sig   Cholecalciferol (VITAMIN D3) 50 MCG (2000 UT) CAPS Take 1 capsule by mouth daily.   conjugated estrogens  (PREMARIN ) vaginal cream Apply 0.5mg  (pea-sized amount)  just inside the vaginal introitus with a finger-tip on  Monday, Wednesday and Friday nights.   losartan  (COZAAR ) 50 MG tablet TAKE 1 TABLET BY MOUTH EVERY DAY   Multiple Vitamins-Minerals (ICAPS AREDS 2 PO) Take by mouth.   Multiple Vitamins-Minerals (WOMENS MULTIVITAMIN PO) Take 1 tablet by mouth.   rosuvastatin  (CRESTOR ) 5 MG tablet Take 1 tablet (5 mg total) by mouth daily.   traZODone  (DESYREL ) 50 MG tablet Take 0.5-1 tablets (25-50 mg total) by mouth at bedtime as needed for sleep.   tiZANidine  (ZANAFLEX ) 2 MG tablet Take 1 tablet (2 mg total) by mouth every 6 (six) hours as needed for muscle spasms. (Patient not taking: Reported on 02/25/2024)   [DISCONTINUED] predniSONE  (DELTASONE ) 10 MG tablet 6 tablets on Day 1 , then reduce by 1 tablet daily until gone (Patient not taking: Reported on 02/25/2024)   Facility-Administered Encounter Medications as of 02/25/2024  Medication   betamethasone  acetate-betamethasone  sodium phosphate (CELESTONE ) injection 3 mg   betamethasone  acetate-betamethasone  sodium phosphate (CELESTONE ) injection 3 mg    Allergies (verified) Nickel, Lisinopril , and Meloxicam   History: Past Medical History:  Diagnosis Date   Aortic atherosclerosis (HCC)    Arthritis    Cough 03/21/2023   COVID-19  09/14/2021   Dilated congestive cardiomyopathy (HCC)    Diverticulitis, colon    GERD (gastroesophageal reflux disease)    Hypertension    TIA (transient ischemic attack)    Past Surgical History:  Procedure Laterality Date   CESAREAN SECTION     x 2    colonoscopy with polypectomy     COLONOSCOPY WITH PROPOFOL  N/A 09/18/2017   Procedure: COLONOSCOPY WITH PROPOFOL ;  Surgeon: Cassie Click, MD;  Location: [facility] ENDOSCOPY;  Service: Endoscopy;  Laterality: N/A;   COLONOSCOPY WITH PROPOFOL  N/A 07/22/2023   Procedure: COLONOSCOPY WITH PROPOFOL ;  Surgeon: Shane Darling, MD;  Location: [facility] ENDOSCOPY;  Service: Endoscopy;  Laterality: N/A;   POLYPECTOMY  07/22/2023   Procedure: POLYPECTOMY;  Surgeon: Shane Darling, MD;  Location: [facility] ENDOSCOPY;  Service: Endoscopy;;   Family History  Problem Relation Age of Onset   Cancer Brother 69       colon ca   Early death Mother    Tuberculosis Mother    Early death Father    Appendicitis Father    Breast cancer Cousin        paternal   Social History   Socioeconomic History   Marital status: Widowed    Spouse name: Not on file   Number of children: Not on file   Years of education: Not on file   Highest education level: Not on file  Occupational History   Not on file  Tobacco Use   Smoking status: Never   Smokeless tobacco: Never  Vaping Use   Vaping status: Never Used  Substance and Sexual Activity   Alcohol use: Yes    Alcohol/week: 4.0 standard drinks of alcohol    Types: 1 Glasses of wine, 3 Standard drinks or equivalent per week    Comment: OCC   Drug use: No   Sexual activity: Not Currently  Other Topics Concern   Not on file  Social History Narrative   married   Social Drivers of Corporate investment banker Strain: Low Risk  (02/25/2024)   Overall Financial Resource Strain (CARDIA)    Difficulty of Paying Living Expenses: Not hard at all  Food Insecurity: No Food Insecurity (02/25/2024)   Hunger Vital  Sign    Worried About Running Out of Food in the Last Year: Never true    Ran Out of Food in the Last Year: Never true  Transportation Needs: No Transportation Needs (02/25/2024)   PRAPARE - Administrator, Civil Service (Medical): No    Lack of Transportation (Non-Medical): No  Physical Activity: Sufficiently Active (02/25/2024)   Exercise Vital Sign    Days of Exercise per Week: 3 days    Minutes of Exercise per Session: 50 min  Stress: No Stress Concern Present (02/25/2024)   [employer] of Occupational Health - Occupational Stress Questionnaire    Feeling of Stress : Not at all  Social Connections: Moderately Integrated (02/25/2024)   Social Connection and Isolation Panel [NHANES]    Frequency of Communication with Friends and Family: Twice a week    Frequency of Social Gatherings with Friends and Family: Twice a week  Attends Religious Services: More than 4 times per year    Active Member of Clubs or Organizations: No    Attends Banker Meetings: Never    Marital Status: Married    Tobacco Counseling Counseling given: Not Answered    Clinical Intake:  Pre-visit preparation completed: Yes  Pain : No/denies pain     BMI - recorded: 24.96 Nutritional Status: BMI of 19-24  Normal Nutritional Risks: None Diabetes: No  Lab Results  Component Value Date   HGBA1C 5.9 08/19/2023     How often do you need to have someone help you when you read instructions, pamphlets, or other written materials from your doctor or pharmacy?: 1 - Never  Interpreter Needed?: No  Information entered by :: R. Lan Entsminger LPN   Activities of Daily Living     02/25/2024    3:01 PM  In your present state of health, do you have any difficulty performing the following activities:  Hearing? 0  Vision? 0  Comment readers  Difficulty concentrating or making decisions? 0  Walking or climbing stairs? 0  Dressing or bathing? 0  Doing errands, shopping? 0  Preparing  Food and eating ? N  Using the Toilet? N  In the past six months, have you accidently leaked urine? N  Do you have problems with loss of bowel control? N  Managing your Medications? N  Managing your Finances? N  Housekeeping or managing your Housekeeping? N    Patient Care Team: Thersia Flax, MD as PCP - General (Internal Medicine) Devorah Fonder, MD as Consulting Physician (Cardiology)  Indicate any recent Medical Services you may have received from other than [facility] providers in the past year (date may be approximate).     Assessment:    This is a routine wellness examination for Sally Morgan.  Hearing/Vision screen Hearing Screening - Comments:: No issues Vision Screening - Comments:: readers   Goals Addressed             This Visit's Progress    Patient Stated       Wants to try to continue to eat good and exercise       Depression Screen     02/25/2024    3:06 PM 11/08/2023    4:39 PM 08/19/2023   10:36 AM 04/15/2023    1:52 PM 04/08/2023   10:17 AM 03/21/2023    9:08 AM 01/17/2023    2:26 PM  PHQ 2/9 Scores  PHQ - 2 Score 0 0 1 1 0 0 0  PHQ- 9 Score 1   2 0      Fall Risk     02/25/2024    3:02 PM 11/08/2023    4:39 PM 08/19/2023   10:36 AM 04/15/2023    1:52 PM 04/08/2023   10:17 AM  Fall Risk   Falls in the past year? 0 0 0 0 0  Number falls in past yr: 0 0 0 0 0  Injury with Fall? 0 0 0 0 0  Risk for fall due to : No Fall Risks No Fall Risks No Fall Risks No Fall Risks No Fall Risks  Follow up Falls prevention discussed;Falls evaluation completed Falls evaluation completed Falls evaluation completed Falls evaluation completed Falls evaluation completed    MEDICARE RISK AT HOME:  Medicare Risk at Home Any stairs in or around the home?: Yes If so, are there any without handrails?: No Home free of loose throw rugs in walkways, pet beds, electrical cords,  etc?: Yes Adequate lighting in your home to reduce risk of falls?: Yes Life alert?: No Use of a cane,  walker or w/c?: No Grab bars in the bathroom?: No Shower chair or bench in shower?: No Elevated toilet seat or a handicapped toilet?: Yes  TIMED UP AND GO:  Was the test performed?  No  Cognitive Function: 6CIT completed    04/09/2018   11:34 AM 04/08/2017   11:37 AM 10/12/2015   10:00 AM  MMSE - Mini Mental State Exam  Orientation to time 5 5 5   Orientation to Place 5 5 5   Registration 3 3 3   Attention/ Calculation 5 5 5   Recall 2 3 3   Language- name 2 objects 2 2 2   Language- repeat 1 1 1   Language- follow 3 step command 3 3 3   Language- read & follow direction 1 1 1   Write a sentence 1 1 1   Copy design 1 1 1   Total score 29 30 30         02/25/2024    3:12 PM 01/17/2023    2:32 PM 06/04/2019    9:26 AM  6CIT Screen  What Year? 0 points 0 points 0 points  What month? 0 points 0 points 0 points  What time? 0 points 0 points 0 points  Count back from 20 0 points 0 points 0 points  Months in reverse 0 points 0 points 0 points  Repeat phrase 0 points 0 points 0 points  Total Score 0 points 0 points 0 points    Immunizations Immunization History  Administered Date(s) Administered   Fluad Quad(high Dose 65+) 07/28/2019, 07/29/2020, 08/22/2021   Fluad Trivalent(High Dose 65+) 08/19/2023   Influenza Whole 08/06/2010   Influenza-Unspecified 09/13/2018   PFIZER(Purple Top)SARS-COV-2 Vaccination 11/03/2019, 11/23/2019   Pneumococcal Conjugate-13 11/02/2013   Pneumococcal Polysaccharide-23 12/08/2015   Td 02/04/2003    Screening Tests Health Maintenance  Topic Date Due   Zoster Vaccines- Shingrix  (1 of 2) Never done   DTaP/Tdap/Td (2 - Tdap) 02/03/2013   COVID-19 Vaccine (3 - Pfizer risk series) 12/21/2019   Medicare Annual Wellness (AWV)  01/17/2024   MAMMOGRAM  03/06/2024   INFLUENZA VACCINE  05/15/2024   Colonoscopy  07/21/2028   Pneumonia Vaccine 30+ Years old  Completed   DEXA SCAN  Completed   Hepatitis C Screening  Completed   HPV VACCINES  Aged Out    Meningococcal B Vaccine  Aged Out    Health Maintenance  Health Maintenance Due  Topic Date Due   Zoster Vaccines- Shingrix  (1 of 2) Never done   DTaP/Tdap/Td (2 - Tdap) 02/03/2013   COVID-19 Vaccine (3 - Pfizer risk series) 12/21/2019   Medicare Annual Wellness (AWV)  01/17/2024   Health Maintenance Items Addressed: Discussed the need to update tetanus (Tdap) and shingles vaccines. Patient declines covid vaccine.  Additional Screening:  Vision Screening: Recommended annual ophthalmology exams for early detection of glaucoma and other disorders of the eye. Up to date [facility]  Dental Screening: Recommended annual dental exams for proper oral hygiene  Community Resource Referral / Chronic Care Management: CRR required this visit?  No   CCM required this visit?  No   Plan:    I have personally reviewed and noted the following in the patient's chart:   Medical and social history Use of alcohol, tobacco or illicit drugs  Current medications and supplements including opioid prescriptions. Patient is not currently taking opioid prescriptions. Functional ability and status Nutritional status Physical activity  Advanced directives List of other physicians Hospitalizations, surgeries, and ER visits in previous 12 months Vitals Screenings to include cognitive, depression, and falls Referrals and appointments  In addition, I have reviewed and discussed with patient certain preventive protocols, quality metrics, and best practice recommendations. A written personalized care plan for preventive services as well as general preventive health recommendations were provided to patient.   Felicitas Horse, LPN   06/12/5620   After Visit Summary: (MyChart) Due to this being a telephonic visit, the after visit summary with patients personalized plan was offered to patient via MyChart   Notes: Nothing significant to report at this time.

## 2024-04-08 ENCOUNTER — Ambulatory Visit (INDEPENDENT_AMBULATORY_CARE_PROVIDER_SITE_OTHER): Admitting: Internal Medicine

## 2024-04-08 DIAGNOSIS — I700 Atherosclerosis of aorta: Secondary | ICD-10-CM | POA: Diagnosis not present

## 2024-04-08 DIAGNOSIS — J432 Centrilobular emphysema: Secondary | ICD-10-CM

## 2024-04-08 DIAGNOSIS — I10 Essential (primary) hypertension: Secondary | ICD-10-CM | POA: Diagnosis not present

## 2024-04-08 DIAGNOSIS — R768 Other specified abnormal immunological findings in serum: Secondary | ICD-10-CM | POA: Diagnosis not present

## 2024-04-08 DIAGNOSIS — R8290 Unspecified abnormal findings in urine: Secondary | ICD-10-CM | POA: Diagnosis not present

## 2024-04-08 DIAGNOSIS — E785 Hyperlipidemia, unspecified: Secondary | ICD-10-CM | POA: Diagnosis not present

## 2024-04-08 DIAGNOSIS — R3915 Urgency of urination: Secondary | ICD-10-CM | POA: Diagnosis not present

## 2024-04-08 DIAGNOSIS — R809 Proteinuria, unspecified: Secondary | ICD-10-CM

## 2024-04-08 NOTE — Progress Notes (Signed)
 Subjective:  Patient ID: Sally Morgan, female    DOB: Apr 05, 1946  Age: 78 y.o. MRN: [id number]  CC: The primary encounter diagnosis was Bad odor of urine. Diagnoses of Urinary urgency, Microalbuminuria, Essential hypertension, Hyperlipidemia LDL goal <100, Hyperlipidemia LDL goal <70, White coat syndrome with hypertension, False positive ana, Aortic atherosclerosis (HCC), and Centrilobular emphysema (HCC) were also pertinent to this visit.   HPI Sally Morgan presents for  Chief Complaint  Patient presents with   Medical Management of Chronic Issues   Follow up on multiple issues  positive ANA , negative ESR/CRP in February in the setting  of polyarthralgias  .  joints feel better.  ESR and CRP were normal/  symptoms gone in warm weather. . No further workup desired.   HTN:  taking losartan   since November 's visit and workup which included a positive proteinuria  test.  home readings have varied from 120 to 130/ 70-80.  Drinks tea in the am and 48 ounces of water   Taking statin by Gollan since December.  Repeat LDL 69;  lfts not done   Urine has a strong odor ,  had some pressure recently which has resolved     Outpatient Medications Prior to Visit  Medication Sig Dispense Refill   Cholecalciferol (VITAMIN D3) 50 MCG (2000 UT) CAPS Take 1 capsule by mouth daily.     conjugated estrogens  (PREMARIN ) vaginal cream Apply 0.5mg  (pea-sized amount)  just inside the vaginal introitus with a finger-tip on  Monday, Wednesday and Friday nights. 30 g 12   losartan  (COZAAR ) 50 MG tablet TAKE 1 TABLET BY MOUTH EVERY DAY 90 tablet 1   Multiple Vitamins-Minerals (ICAPS AREDS 2 PO) Take by mouth.     Multiple Vitamins-Minerals (WOMENS MULTIVITAMIN PO) Take 1 tablet by mouth.     tiZANidine  (ZANAFLEX ) 2 MG tablet Take 1 tablet (2 mg total) by mouth every 6 (six) hours as needed for muscle spasms. (Patient not taking: Reported on 02/25/2024) 30 tablet 0   traZODone  (DESYREL ) 50 MG tablet Take  0.5-1 tablets (25-50 mg total) by mouth at bedtime as needed for sleep. 30 tablet 3   rosuvastatin  (CRESTOR ) 5 MG tablet Take 1 tablet (5 mg total) by mouth daily. 90 tablet 3   Facility-Administered Medications Prior to Visit  Medication Dose Route Frequency Provider Last Rate Last Admin   betamethasone  acetate-betamethasone  sodium phosphate (CELESTONE ) injection 3 mg  3 mg Intramuscular Once Evans, Brent M, DPM       betamethasone  acetate-betamethasone  sodium phosphate (CELESTONE ) injection 3 mg  3 mg Intramuscular Once Sally Morgan, DPM        Review of Systems;  Patient denies headache, fevers, malaise, unintentional weight loss, skin rash, eye pain, sinus congestion and sinus pain, sore throat, dysphagia,  hemoptysis , cough, dyspnea, wheezing, chest pain, palpitations, orthopnea, edema, abdominal pain, nausea, melena, diarrhea, constipation, flank pain, dysuria, hematuria, urinary  Frequency, nocturia, numbness, tingling, seizures,  Focal weakness, Loss of consciousness,  Tremor, insomnia, depression, anxiety, and suicidal ideation.      Objective:  There were no vitals taken for this visit.  BP Readings from Last 3 Encounters:  02/25/24 124/82  11/08/23 134/82  09/16/23 (!) 140/80    Wt Readings from Last 3 Encounters:  02/25/24 150 lb (68 kg)  11/08/23 153 lb 9.6 oz (69.7 kg)  09/16/23 153 lb 6 oz (69.6 kg)    Physical Exam Vitals reviewed.  Constitutional:      General: She  is not in acute distress.    Appearance: Normal appearance. She is normal weight. She is not ill-appearing, toxic-appearing or diaphoretic.  HENT:     Head: Normocephalic.   Eyes:     General: No scleral icterus.       Right eye: No discharge.        Left eye: No discharge.     Conjunctiva/sclera: Conjunctivae normal.    Cardiovascular:     Rate and Rhythm: Normal rate and regular rhythm.     Heart sounds: Normal heart sounds.  Pulmonary:     Effort: Pulmonary effort is normal. No  respiratory distress.     Breath sounds: Normal breath sounds.   Musculoskeletal:        General: Normal range of motion.   Skin:    General: Skin is warm and dry.   Neurological:     General: No focal deficit present.     Mental Status: She is alert and oriented to person, place, and time. Mental status is at baseline.   Psychiatric:        Mood and Affect: Mood normal.        Behavior: Behavior normal.        Thought Content: Thought content normal.        Judgment: Judgment normal.    Lab Results  Component Value Date   HGBA1C 5.9 08/19/2023    Lab Results  Component Value Date   CREATININE 0.73 04/08/2024   CREATININE 0.66 08/19/2023   CREATININE 0.69 08/27/2022    Lab Results  Component Value Date   WBC 4.8 08/19/2023   HGB 12.7 08/19/2023   HCT 38.9 08/19/2023   PLT 223.0 08/19/2023   GLUCOSE 92 04/08/2024   CHOL 140 02/04/2024   TRIG 80 02/04/2024   HDL 55 02/04/2024   LDLDIRECT 160.0 08/19/2023   LDLCALC 69 02/04/2024   ALT 31 04/08/2024   AST 33 04/08/2024   NA 134 (L) 04/08/2024   K 4.0 04/08/2024   CL 100 04/08/2024   CREATININE 0.73 04/08/2024   BUN 19 04/08/2024   CO2 26 04/08/2024   TSH 0.97 08/19/2023   HGBA1C 5.9 08/19/2023   MICROALBUR 6.8 (H) 04/08/2024    DG Cervical Spine 2 or 3 views Result Date: 11/17/2023 CLINICAL DATA:  1 month history of posterior neck pain radiating to the aspect and left elbow. Cervical spine colitis with radiculitis. EXAM: CERVICAL SPINE - 2-3 VIEW COMPARISON:  Cervical spine radiographs 11/23/2011 FINDINGS: Normal frontal and sagittal alignment. The atlantodens interval is intact. Vertebral body heights are maintained. Mild posterior C6-7 disc space narrowing. Mild to moderate anterior C5-6 and mild anterior C6-7 endplate osteophytes. Left-greater-than-right C4-5 through C6-7 facet joint sclerosis and peripheral osteophytosis. No prevertebral soft tissue swelling.  The lung apices are clear. IMPRESSION: 1. Mild  posterior C6-7 degenerative disc changes. 2. Left-greater-than-right C4-5 through C6-7 facet joint osteoarthritis. Electronically Signed   By: Tanda Lyons M.D.   On: 11/17/2023 14:32   DG Hand Complete Left Result Date: 11/17/2023 CLINICAL DATA:  Swelling and pain of the fifth finger PIP joint. Arthralgia left hand. Chronic pain. No known injury. EXAM: LEFT HAND - COMPLETE 3+ VIEW COMPARISON:  None Available. FINDINGS: Mildly decreased bone mineralization. Severe fifth PIP joint space narrowing, subchondral sclerosis/cystic change, and peripheral osteophytosis. Mild central erosion as can be seen with erosive osteoarthritis. Severe fifth DIP and moderate to severe second through fourth DIP joint space narrowing with mild peripheral osteophytosis. Moderate thumb, mild-to-moderate second  PIP, and mild third and fourth PIP joint space narrowing and peripheral osteophytosis. Mild-to-moderate lateral aspect of the thumb metacarpophalangeal joint space narrowing. Mild thumb carpometacarpal joint space narrowing and peripheral osteophytosis. No acute fracture or dislocation. IMPRESSION: 1. Severe fifth PIP osteoarthritis with mild central erosion as can be seen with erosive osteoarthritis. 2. Severe fifth DIP and moderate to severe second through fourth DIP joint space narrowing. 3. Moderate thumb osteoarthritis. Electronically Signed   By: Tanda Lyons M.D.   On: 11/17/2023 14:29    Assessment & Plan:  .Bad odor of urine  Urinary urgency -     Urinalysis, Routine w reflex microscopic -     Urine Culture  Microalbuminuria -     Microalbumin / creatinine urine ratio -     Microalbumin / creatinine urine ratio; Future  Essential hypertension -     Microalbumin / creatinine urine ratio; Future  Hyperlipidemia LDL goal <100  Hyperlipidemia LDL goal <70 Assessment & Plan: Rosuvastatin  was started in December.  Has not had LFTs checked since November.  LDL is at goal   Lab Results  Component Value  Date   CHOL 140 02/04/2024   HDL 55 02/04/2024   LDLCALC 69 02/04/2024   LDLDIRECT 160.0 08/19/2023   TRIG 80 02/04/2024   CHOLHDL 2.5 02/04/2024     Orders: -     Comprehensive metabolic panel with GFR  White coat syndrome with hypertension Assessment & Plan: cOntinue losartan  for management of progressive  microalbuminuria . Rtc 3 months for repeat testing,  I UaCR remains  30 will recommend adding SGLT2 inhibitior   False positive ana Assessment & Plan: Her joint pain is unaccompanied by synovitis and elevated inflammatory markers.  No further workup needed    Aortic atherosclerosis (HCC) Assessment & Plan: She has been  tolerating rosuvastatin  ,  prescribed by Dr Gollan several months ago,  and LDL is at goal.  Checked today and  normal    Centrilobular emphysema (HCC) Assessment & Plan: Noted previously on chest imaging in a lifelong non smoker who is asymptomatic.  Alpha 1 ANTITRYPSIN is normal phenotype.  She defers additional workup.  Recommending RSV vaccine (AGAIN)/  STILL WALKS 2 MILES  3 TIMES PER WEEK.     Other orders -     Rosuvastatin  Calcium ; Take 1 tablet (5 mg total) by mouth daily.  Dispense: 90 tablet; Refill: 3     I spent 34 minutes on the day of this face to face encounter reviewing patient's  most recent visit with cardiology,   prior relevant surgical and non surgical procedures, recent  labs and imaging studies,   reviewing the assessment and plan with patient, and post visit ordering and reviewing of  diagnostics and therapeutics with patient  .   Follow-up: Return in about 6 months (around 10/08/2024).   Verneita LITTIE Kettering, MD

## 2024-04-08 NOTE — Assessment & Plan Note (Addendum)
 cOntinue losartan  , added for management of progressive  microalbuminuria . Increased risk for cardiovascular events discussed.  Rtc 3 months for repeat testing,  I UaCR remains  30 will recommend adding SGLT2 inhibitior

## 2024-04-08 NOTE — Assessment & Plan Note (Signed)
 Her joint pain is unaccompanied by synovitis and elevated inflammatory markers.  No further workup needed

## 2024-04-08 NOTE — Patient Instructions (Signed)
 Continue the losartan  .  This is your blood pressure medication that also protects the kidneys  We are repeating the urine test for protein and infection  Continue rosuvastatin .  It has lowered your cholesterol to goal

## 2024-04-08 NOTE — Assessment & Plan Note (Signed)
 Rosuvastatin  was started in December.  Has not had LFTs checked since November.  LDL is at goal   Lab Results  Component Value Date   CHOL 140 02/04/2024   HDL 55 02/04/2024   LDLCALC 69 02/04/2024   LDLDIRECT 160.0 08/19/2023   TRIG 80 02/04/2024   CHOLHDL 2.5 02/04/2024

## 2024-04-09 LAB — COMPREHENSIVE METABOLIC PANEL WITH GFR
ALT: 31 U/L (ref 0–35)
AST: 33 U/L (ref 0–37)
Albumin: 4.7 g/dL (ref 3.5–5.2)
Alkaline Phosphatase: 72 U/L (ref 39–117)
BUN: 19 mg/dL (ref 6–23)
CO2: 26 meq/L (ref 19–32)
Calcium: 9.8 mg/dL (ref 8.4–10.5)
Chloride: 100 meq/L (ref 96–112)
Creatinine, Ser: 0.73 mg/dL (ref 0.40–1.20)
GFR: 79.31 mL/min (ref >60.00)
Glucose, Bld: 92 mg/dL (ref 70–99)
Potassium: 4 meq/L (ref 3.5–5.1)
Sodium: 134 meq/L — ABNORMAL LOW (ref 135–145)
Total Bilirubin: 0.5 mg/dL (ref 0.2–1.2)
Total Protein: 7.9 g/dL (ref 6.0–8.3)

## 2024-04-09 LAB — URINALYSIS, ROUTINE W REFLEX MICROSCOPIC
Bilirubin Urine: NEGATIVE
Ketones, ur: NEGATIVE
Nitrite: NEGATIVE
Specific Gravity, Urine: <=1.005 — AB (ref 1.000–1.030)
Total Protein, Urine: NEGATIVE
Urine Glucose: NEGATIVE
Urobilinogen, UA: 0.2 (ref 0.0–1.0)
pH: 6 (ref 5.0–8.0)

## 2024-04-09 LAB — URINE CULTURE
MICRO NUMBER:: 16623795
SPECIMEN QUALITY:: ADEQUATE

## 2024-04-09 LAB — MICROALBUMIN / CREATININE URINE RATIO
Creatinine,U: 38.8 mg/dL
Microalb Creat Ratio: 175.3 mg/g — ABNORMAL HIGH (ref 0.0–30.0)
Microalb, Ur: 6.8 mg/dL — ABNORMAL HIGH (ref 0.0–1.9)

## 2024-04-10 ENCOUNTER — Ambulatory Visit: Payer: Self-pay | Admitting: Internal Medicine

## 2024-04-10 ENCOUNTER — Encounter: Payer: Self-pay | Admitting: Internal Medicine

## 2024-04-10 MED ORDER — ROSUVASTATIN CALCIUM 5 MG PO TABS: 5.0000 mg | ORAL_TABLET | Freq: Every day | ORAL | 3 refills | Status: DC

## 2024-04-10 NOTE — Assessment & Plan Note (Addendum)
 Noted previously on chest imaging in a lifelong non smoker who is asymptomatic.  Alpha 1 ANTITRYPSIN is normal phenotype.  She defers additional workup.  Recommending RSV vaccine (AGAIN)/  STILL WALKS 2 MILES  3 TIMES PER WEEK.

## 2024-04-10 NOTE — Assessment & Plan Note (Addendum)
 She has been  tolerating rosuvastatin  ,  prescribed by Dr Gollan several months ago,  and LDL is at goal.  Checked today and  normal

## 2024-07-09 ENCOUNTER — Telehealth: Payer: Self-pay | Admitting: Internal Medicine

## 2024-07-09 ENCOUNTER — Other Ambulatory Visit (INDEPENDENT_AMBULATORY_CARE_PROVIDER_SITE_OTHER)

## 2024-07-09 DIAGNOSIS — R809 Proteinuria, unspecified: Secondary | ICD-10-CM

## 2024-07-09 DIAGNOSIS — I10 Essential (primary) hypertension: Secondary | ICD-10-CM | POA: Diagnosis not present

## 2024-07-09 LAB — MICROALBUMIN / CREATININE URINE RATIO
Creatinine,U: 53.3 mg/dL
Microalb Creat Ratio: 147.9 mg/g — ABNORMAL HIGH (ref 0.0–30.0)
Microalb, Ur: 7.9 mg/dL — ABNORMAL HIGH (ref 0.0–1.9)

## 2024-07-09 NOTE — Assessment & Plan Note (Signed)
 Home readings from Sept 2025 have ranged from 113/74 to 125/80

## 2024-07-09 NOTE — Telephone Encounter (Signed)
 Pt brought in list of BP readings for DR Marylynn to review, the list is in the color folder up front

## 2024-07-09 NOTE — Telephone Encounter (Signed)
 Placed in yellow results folder for review.

## 2024-07-09 NOTE — Telephone Encounter (Signed)
 Pt is aware and gave a verbal understanding.

## 2024-07-14 DIAGNOSIS — H04123 Dry eye syndrome of bilateral lacrimal glands: Secondary | ICD-10-CM | POA: Diagnosis not present

## 2024-07-14 DIAGNOSIS — H353132 Nonexudative age-related macular degeneration, bilateral, intermediate dry stage: Secondary | ICD-10-CM | POA: Diagnosis not present

## 2024-07-14 DIAGNOSIS — H43813 Vitreous degeneration, bilateral: Secondary | ICD-10-CM | POA: Diagnosis not present

## 2024-07-14 DIAGNOSIS — H2513 Age-related nuclear cataract, bilateral: Secondary | ICD-10-CM | POA: Diagnosis not present

## 2024-07-17 ENCOUNTER — Encounter: Payer: Self-pay | Admitting: Internal Medicine

## 2024-07-17 ENCOUNTER — Ambulatory Visit (INDEPENDENT_AMBULATORY_CARE_PROVIDER_SITE_OTHER): Admitting: Internal Medicine

## 2024-07-17 VITALS — BP 140/84 | HR 94 | Ht 65.0 in | Wt 156.8 lb

## 2024-07-17 DIAGNOSIS — R809 Proteinuria, unspecified: Secondary | ICD-10-CM | POA: Diagnosis not present

## 2024-07-17 DIAGNOSIS — R8281 Pyuria: Secondary | ICD-10-CM

## 2024-07-17 DIAGNOSIS — N289 Disorder of kidney and ureter, unspecified: Secondary | ICD-10-CM | POA: Diagnosis not present

## 2024-07-17 MED ORDER — LOSARTAN POTASSIUM 50 MG PO TABS: 50.0000 mg | ORAL_TABLET | Freq: Every day | ORAL | 1 refills | Status: AC

## 2024-07-17 MED ORDER — ALPRAZOLAM 0.25 MG PO TABS: 0.2500 mg | ORAL_TABLET | Freq: Every evening | ORAL | 2 refills | Status: AC | PRN

## 2024-07-17 MED ORDER — TRAZODONE HCL 50 MG PO TABS: 25.0000 mg | ORAL_TABLET | Freq: Every evening | ORAL | 3 refills | Status: AC | PRN

## 2024-07-17 NOTE — Progress Notes (Signed)
 Subjective:  Patient ID: Sally Morgan, female    DOB: [birth year]  Age: 78 y.o. MRN: [id number]  CC: The primary encounter diagnosis was Proteinuria, unspecified type. Diagnoses of Nephropathy and Pyuria, sterile were also pertinent to this visit.   HPI Sally Morgan presents for  Chief Complaint  Patient presents with   Medical Management of Chronic Issues   Recurrent  urinary complaints:    Kitti has noted an abnormal appearance to her urine and has taken a photograph of a recent void because she noted  sediment .  She believes her symptoms started when she began taking a statin , but continues to have a feeling of pressure /urgency but only able to void small amounts .  Her recent urinalysis was notable for pyuria and microscopic hematuria without infection,  and  recent microalb/cr ratio noted a    Uacr >100 . She is taking losartan  50 mg daily for hypertension.  Her home readings have been  lower than in the office,   often 115/75 ,  120/70 range.    Outpatient Medications Prior to Visit  Medication Sig Dispense Refill   Cholecalciferol (VITAMIN D3) 50 MCG (2000 UT) CAPS Take 1 capsule by mouth daily.     conjugated estrogens  (PREMARIN ) vaginal cream Apply 0.5mg  (pea-sized amount)  just inside the vaginal introitus with a finger-tip on  Monday, Wednesday and Friday nights. 30 g 12   Multiple Vitamins-Minerals (ICAPS AREDS 2 PO) Take by mouth.     Multiple Vitamins-Minerals (WOMENS MULTIVITAMIN PO) Take 1 tablet by mouth.     rosuvastatin  (CRESTOR ) 5 MG tablet Take 1 tablet (5 mg total) by mouth daily. 90 tablet 3   losartan  (COZAAR ) 50 MG tablet TAKE 1 TABLET BY MOUTH EVERY DAY 90 tablet 1   traZODone  (DESYREL ) 50 MG tablet Take 0.5-1 tablets (25-50 mg total) by mouth at bedtime as needed for sleep. 30 tablet 3   tiZANidine  (ZANAFLEX ) 2 MG tablet Take 1 tablet (2 mg total) by mouth every 6 (six) hours as needed for muscle spasms. (Patient not taking: Reported on 07/17/2024) 30  tablet 0   Facility-Administered Medications Prior to Visit  Medication Dose Route Frequency Provider Last Rate Last Admin   betamethasone  acetate-betamethasone  sodium phosphate (CELESTONE ) injection 3 mg  3 mg Intramuscular Once Evans, Brent M, DPM       betamethasone  acetate-betamethasone  sodium phosphate (CELESTONE ) injection 3 mg  3 mg Intramuscular Once Evans, Brent M, DPM        Review of Systems;  Patient denies headache, fevers, malaise, unintentional weight loss, skin rash, eye pain, sinus congestion and sinus pain, sore throat, dysphagia,  hemoptysis , cough, dyspnea, wheezing, chest pain, palpitations, orthopnea, edema, abdominal pain, nausea, melena, diarrhea, constipation, flank pain, dysuria, hematuria, urinary  Frequency, nocturia, numbness, tingling, seizures,  Focal weakness, Loss of consciousness,  Tremor, insomnia, depression, anxiety, and suicidal ideation.      Objective:  BP (!) 140/84   Pulse 94   Ht 5' 5 (1.651 m)   Wt 156 lb 12.8 oz (71.1 kg)   SpO2 98%   BMI 26.09 kg/m   BP Readings from Last 3 Encounters:  07/17/24 (!) 140/84  02/25/24 124/82  11/08/23 134/82    Wt Readings from Last 3 Encounters:  07/17/24 156 lb 12.8 oz (71.1 kg)  02/25/24 150 lb (68 kg)  11/08/23 153 lb 9.6 oz (69.7 kg)    Physical Exam Vitals reviewed.  Constitutional:      General:  She is not in acute distress.    Appearance: Normal appearance. She is normal weight. She is not ill-appearing, toxic-appearing or diaphoretic.  HENT:     Head: Normocephalic.  Eyes:     General: No scleral icterus.       Right eye: No discharge.        Left eye: No discharge.     Conjunctiva/sclera: Conjunctivae normal.  Cardiovascular:     Rate and Rhythm: Normal rate and regular rhythm.     Heart sounds: Normal heart sounds.  Pulmonary:     Effort: Pulmonary effort is normal. No respiratory distress.     Breath sounds: Normal breath sounds.  Musculoskeletal:        General: Normal  range of motion.  Skin:    General: Skin is warm and dry.  Neurological:     General: No focal deficit present.     Mental Status: She is alert and oriented to person, place, and time. Mental status is at baseline.  Psychiatric:        Mood and Affect: Mood normal.        Behavior: Behavior normal.        Thought Content: Thought content normal.        Judgment: Judgment normal.     Lab Results  Component Value Date   HGBA1C 5.9 08/19/2023    Lab Results  Component Value Date   CREATININE 0.73 04/08/2024   CREATININE 0.66 08/19/2023   CREATININE 0.69 08/27/2022    Lab Results  Component Value Date   WBC 4.8 08/19/2023   HGB 12.7 08/19/2023   HCT 38.9 08/19/2023   PLT 223.0 08/19/2023   GLUCOSE 92 04/08/2024   CHOL 140 02/04/2024   TRIG 80 02/04/2024   HDL 55 02/04/2024   LDLDIRECT 160.0 08/19/2023   LDLCALC 69 02/04/2024   ALT 31 04/08/2024   AST 33 04/08/2024   NA 134 (L) 04/08/2024   K 4.0 04/08/2024   CL 100 04/08/2024   CREATININE 0.73 04/08/2024   BUN 19 04/08/2024   CO2 26 04/08/2024   TSH 0.97 08/19/2023   HGBA1C 5.9 08/19/2023   MICROALBUR 7.9 (H) 07/09/2024    DG Cervical Spine 2 or 3 views Result Date: 11/17/2023 CLINICAL DATA:  1 month history of posterior neck pain radiating to the aspect and left elbow. Cervical spine colitis with radiculitis. EXAM: CERVICAL SPINE - 2-3 VIEW COMPARISON:  Cervical spine radiographs 11/23/2011 FINDINGS: Normal frontal and sagittal alignment. The atlantodens interval is intact. Vertebral body heights are maintained. Mild posterior C6-7 disc space narrowing. Mild to moderate anterior C5-6 and mild anterior C6-7 endplate osteophytes. Left-greater-than-right C4-5 through C6-7 facet joint sclerosis and peripheral osteophytosis. No prevertebral soft tissue swelling.  The lung apices are clear. IMPRESSION: 1. Mild posterior C6-7 degenerative disc changes. 2. Left-greater-than-right C4-5 through C6-7 facet joint osteoarthritis.  Electronically Signed   By: Tanda Lyons M.D.   On: 11/17/2023 14:32   DG Hand Complete Left Result Date: 11/17/2023 CLINICAL DATA:  Swelling and pain of the fifth finger PIP joint. Arthralgia left hand. Chronic pain. No known injury. EXAM: LEFT HAND - COMPLETE 3+ VIEW COMPARISON:  None Available. FINDINGS: Mildly decreased bone mineralization. Severe fifth PIP joint space narrowing, subchondral sclerosis/cystic change, and peripheral osteophytosis. Mild central erosion as can be seen with erosive osteoarthritis. Severe fifth DIP and moderate to severe second through fourth DIP joint space narrowing with mild peripheral osteophytosis. Moderate thumb, mild-to-moderate second PIP, and mild third and  fourth PIP joint space narrowing and peripheral osteophytosis. Mild-to-moderate lateral aspect of the thumb metacarpophalangeal joint space narrowing. Mild thumb carpometacarpal joint space narrowing and peripheral osteophytosis. No acute fracture or dislocation. IMPRESSION: 1. Severe fifth PIP osteoarthritis with mild central erosion as can be seen with erosive osteoarthritis. 2. Severe fifth DIP and moderate to severe second through fourth DIP joint space narrowing. 3. Moderate thumb osteoarthritis. Electronically Signed   By: Tanda Lyons M.D.   On: 11/17/2023 14:29    Assessment & Plan:  .Proteinuria, unspecified type -     Ambulatory referral to Nephrology -     US  RENAL; Future  Nephropathy Assessment & Plan: Etiolgy  unclear.  She has hypertension but UaCR has increased despte taking ARB.  Given her symptoms  referring to nephrology,  may need urology as well for cystoscopy. Renal ultrasound ordered.    Pyuria, sterile -     US  RENAL; Future  Other orders -     Losartan  Potassium; Take 1 tablet (50 mg total) by mouth daily.  Dispense: 90 tablet; Refill: 1 -     traZODone  HCl; Take 0.5-1 tablets (25-50 mg total) by mouth at bedtime as needed for sleep.  Dispense: 30 tablet; Refill: 3 -      ALPRAZolam; Take 1 tablet (0.25 mg total) by mouth at bedtime as needed for anxiety.  Dispense: 30 tablet; Refill: 2     I spent 34 minutes on the day of this face to face encounter reviewing patient's  most recent visit with cardiology,  nephrology,  and neurology,  prior relevant surgical and non surgical procedures, recent  labs and imaging studies, counseling on weight management,  reviewing the assessment and plan with patient, and post visit ordering and reviewing of  diagnostics and therapeutics with patient  .   Follow-up: No follow-ups on file.   Verneita LITTIE Kettering, MD

## 2024-07-17 NOTE — Patient Instructions (Signed)
 I am referring you to nephrology  (kidney specialist) to help diagnose what is causing your urine to be abnormal.  I believe it may related to the protein in your urine   If they feel your bladder needs to be evaluated,  they will recommend an evaluation by Urology

## 2024-07-19 DIAGNOSIS — N289 Disorder of kidney and ureter, unspecified: Secondary | ICD-10-CM | POA: Insufficient documentation

## 2024-07-19 NOTE — Assessment & Plan Note (Signed)
 Etiolgy  unclear.  She has hypertension but UaCR has increased despte taking ARB.  Given her symptoms  referring to nephrology,  may need urology as well for cystoscopy. Renal ultrasound ordered.

## 2024-07-22 DIAGNOSIS — M1990 Unspecified osteoarthritis, unspecified site: Secondary | ICD-10-CM | POA: Diagnosis not present

## 2024-07-22 DIAGNOSIS — R809 Proteinuria, unspecified: Secondary | ICD-10-CM | POA: Diagnosis not present

## 2024-07-22 DIAGNOSIS — E785 Hyperlipidemia, unspecified: Secondary | ICD-10-CM | POA: Diagnosis not present

## 2024-07-22 DIAGNOSIS — I10 Essential (primary) hypertension: Secondary | ICD-10-CM | POA: Diagnosis not present

## 2024-07-22 DIAGNOSIS — I6350 Cerebral infarction due to unspecified occlusion or stenosis of unspecified cerebral artery: Secondary | ICD-10-CM | POA: Diagnosis not present

## 2024-07-22 DIAGNOSIS — K219 Gastro-esophageal reflux disease without esophagitis: Secondary | ICD-10-CM | POA: Diagnosis not present

## 2024-07-22 DIAGNOSIS — R8281 Pyuria: Secondary | ICD-10-CM | POA: Diagnosis not present

## 2024-07-23 ENCOUNTER — Ambulatory Visit
Admission: RE | Admit: 2024-07-23 | Discharge: 2024-07-23 | Disposition: A | Discharge status: Home / Self Care | Source: Ambulatory Visit | Attending: Internal Medicine | Admitting: Internal Medicine

## 2024-07-23 DIAGNOSIS — R8281 Pyuria: Secondary | ICD-10-CM | POA: Diagnosis not present

## 2024-07-23 DIAGNOSIS — R809 Proteinuria, unspecified: Secondary | ICD-10-CM | POA: Insufficient documentation

## 2024-07-30 ENCOUNTER — Ambulatory Visit: Payer: Self-pay | Admitting: Internal Medicine

## 2024-09-01 NOTE — Telephone Encounter (Signed)
 open in error

## 2024-09-02 ENCOUNTER — Ambulatory Visit: Admitting: Urology

## 2024-09-02 VITALS — BP 127/80 | HR 89 | Ht 65.0 in | Wt 155.0 lb

## 2024-09-02 DIAGNOSIS — R339 Retention of urine, unspecified: Secondary | ICD-10-CM | POA: Diagnosis not present

## 2024-09-02 DIAGNOSIS — N390 Urinary tract infection, site not specified: Secondary | ICD-10-CM

## 2024-09-02 DIAGNOSIS — R8281 Pyuria: Secondary | ICD-10-CM

## 2024-09-02 DIAGNOSIS — Z87440 Personal history of urinary (tract) infections: Secondary | ICD-10-CM | POA: Diagnosis not present

## 2024-09-02 LAB — BLADDER SCAN AMB NON-IMAGING

## 2024-09-02 LAB — MICROSCOPIC EXAMINATION

## 2024-09-02 NOTE — Progress Notes (Signed)
   09/02/2024 3:32 PM   Sally Morgan 09/04/1946 [id number]  Reason for visit: Pyuria, urinary symptoms, history of recurrent UTI  History: Last seen by me in August 2021 when she was having culture documented recurrent UTIs with symptoms of dysuria, frequency, pelvic pressure.  Since starting cranberry tablets and topical estrogen cream has not had any further infections Recent pyuria with negative culture from PCP and was referred to urology, renal ultrasound was benign, renal function normal  Physical Exam: BP 127/80 (BP Location: Left Arm, Patient Position: Sitting, Cuff Size: Normal)   Pulse 89   Ht 5' 5 (1.651 m)   Wt 155 lb (70.3 kg)   SpO2 98%   BMI 25.79 kg/m   Imaging/labs: Creatinine 0.73, eGFR greater than 60 from June 2025 Urinalysis 20-50 WBC, moderate leukocytes, 0 RBC, few bacteria, culture negative from June 2025 I personally viewed and interpreted the renal ultrasound dated 07/23/2024 with no hydronephrosis, no bladder lesions  Today: She reports at least a few months of weak urinary stream during the day and a sensation of incomplete emptying, does not have significant problems voiding overnight or in the morning Denies any UTIs since starting the cranberry tablets and topical estrogen cream Reports sensation of a bulge and is concerned about possible prolapse PVR today significantly elevated at  Plan:   Recurrent UTI: Resolved after starting topical estrogen cream and cranberry tablet prophylaxis Incomplete bladder emptying: High suspicion for prolapse as etiology of her incomplete bladder emptying.  She was taught intermittent catheterization today 1-2 times daily to help with bladder emptying, and she would like a referral to urogynecology for further evaluation and treatment options   Redell JAYSON Burnet, MD  [facility] [address], [address] [place], [place] [address] [phone]

## 2024-09-02 NOTE — Patient Instructions (Signed)
   Step 1 Get all of your supplies ready and place near you. Step 2 Wash your hands or put on gloves. Step 3 Wash around urethral opening with warm antibacterial/ hypoallergenic soapy water from front to back. Step 4 take the catheter out of the package and drain the lubricant over toilet. Step 5 Sit on the toilet and spread your legs to begin catheterization. Step 6 Use your fingers to spread the labia and feel for the urethra.             A MIRROR MAY BE HELPFUL AT FIRST Step 7 Insert the catheter slowly into the urethra. If there is resistance when the catheter reaches the the sphincter muscle,              take a deep breath and gently apply steady pressure.            DO NOT FORCE THE CATHETER Step 8 When the urine begins to flow insert another inch, allow the urine to flow into the toilet. Step 9 When the flow of urine stops, slowly remove the catheter.  

## 2024-09-02 NOTE — Progress Notes (Signed)
 Continuous Intermittent Catheterization  Due to urinary retention patient is present today for a teaching of self I & O Catheterization. Patient was given detailed verbal and printed instructions of self catheterization as well as given the opportunity to watch a coloplast self cathing. Patient was cleaned and prepped in a sterile fashion. With instruction and assistance patient inserted a 14FR and urine return was noted 600 ml, urine was pale yellow in color. Patient tolerated well, no complications were noted Patient was given a sample bag with supplies to take home.  Instructions were given per Dr. Francisca for patient to cath 2 times daily.  An order was placed with [employer] for catheters to be sent to the patient's home. Patient is to follow up with urogynocology, referral placed.   Performed by: Jerrilynn Mikowski, CMA ([facility]AMA)  Additional Notes: Referral placed for urogyn

## 2024-09-03 LAB — URINALYSIS, COMPLETE
Bilirubin, UA: NEGATIVE
Glucose, UA: NEGATIVE
Ketones, UA: NEGATIVE
Nitrite, UA: NEGATIVE
Protein,UA: NEGATIVE
Specific Gravity, UA: 1.01 (ref 1.005–1.030)
Urobilinogen, Ur: 0.2 mg/dL (ref 0.2–1.0)
pH, UA: 6 (ref 5.0–7.5)

## 2024-09-03 LAB — MICROSCOPIC EXAMINATION: WBC, UA: >30 /HPF — AB (ref 0–5)

## 2024-09-07 ENCOUNTER — Telehealth: Payer: Self-pay | Admitting: Urology

## 2024-09-07 DIAGNOSIS — R339 Retention of urine, unspecified: Secondary | ICD-10-CM

## 2024-09-07 DIAGNOSIS — Z789 Other specified health status: Secondary | ICD-10-CM

## 2024-09-07 DIAGNOSIS — N819 Female genital prolapse, unspecified: Secondary | ICD-10-CM

## 2024-09-07 LAB — CULTURE, URINE COMPREHENSIVE

## 2024-09-07 NOTE — Telephone Encounter (Signed)
 Patient called and stated the Urogynecology office that Dr. Francisca referred her to is not taking new patients at this time. She was told to contact our office for next step.

## 2024-09-07 NOTE — Telephone Encounter (Signed)
 Referral placed to [facility].

## 2024-09-08 ENCOUNTER — Telehealth: Payer: Self-pay

## 2024-09-08 NOTE — Telephone Encounter (Signed)
 Copied from CRM #[id number]. Topic: Referral - Request for Referral >> Sep 08, 2024  4:20 PM Viola F wrote: Did the patient discuss referral with their provider in the last year? Yes (If No - schedule appointment) (If Yes - send message)  Appointment offered? No  Type of order/referral and detailed reason for visit: cannot urinate  Preference of office, provider, location: She was referred to [facility] at [facility] but they are not accepting new patients - she needs another referral to a different gynecologist  If referral order, have you been seen by this specialty before? No (If Yes, this issue or another issue? When? Where?  Can we respond through MyChart? No

## 2024-09-08 NOTE — Telephone Encounter (Signed)
 Preference of office, provider, location: She was referred to [facility] at [facility] but they are not accepting new patients - she needs another referral to a different gynecologist

## 2025-03-01 ENCOUNTER — Ambulatory Visit
